# Patient Record
Sex: Female | Born: 1940 | Race: Black or African American | Hispanic: No | Smoking: Never smoker
Health system: Southern US, Community
[De-identification: ages and names within clinical notes are randomized; demographics above are authoritative.]

## PROBLEM LIST (undated history)

## (undated) DIAGNOSIS — J45909 Unspecified asthma, uncomplicated: Secondary | ICD-10-CM

## (undated) DIAGNOSIS — K219 Gastro-esophageal reflux disease without esophagitis: Secondary | ICD-10-CM

## (undated) DIAGNOSIS — I10 Essential (primary) hypertension: Secondary | ICD-10-CM

## (undated) DIAGNOSIS — N189 Chronic kidney disease, unspecified: Secondary | ICD-10-CM

## (undated) DIAGNOSIS — M109 Gout, unspecified: Secondary | ICD-10-CM

## (undated) DIAGNOSIS — M1990 Unspecified osteoarthritis, unspecified site: Secondary | ICD-10-CM

## (undated) DIAGNOSIS — R0602 Shortness of breath: Secondary | ICD-10-CM

## (undated) HISTORY — DX: Gout, unspecified: M10.9

## (undated) HISTORY — PX: TONSILLECTOMY: SUR1361

## (undated) HISTORY — DX: Chronic kidney disease, unspecified: N18.9

## (undated) HISTORY — DX: Unspecified asthma, uncomplicated: J45.909

## (undated) HISTORY — PX: TUBAL LIGATION: SHX77

---

## 2002-01-22 ENCOUNTER — Ambulatory Visit (HOSPITAL_COMMUNITY): Admission: RE | Admit: 2002-01-22 | Discharge: 2002-01-22 | Payer: Self-pay | Admitting: Internal Medicine

## 2003-02-24 ENCOUNTER — Ambulatory Visit (HOSPITAL_COMMUNITY): Admission: RE | Admit: 2003-02-24 | Discharge: 2003-02-24 | Payer: Self-pay | Admitting: Internal Medicine

## 2003-02-28 ENCOUNTER — Encounter: Payer: Self-pay | Admitting: Internal Medicine

## 2003-02-28 ENCOUNTER — Ambulatory Visit (HOSPITAL_COMMUNITY): Admission: RE | Admit: 2003-02-28 | Discharge: 2003-02-28 | Payer: Self-pay | Admitting: Internal Medicine

## 2005-04-07 ENCOUNTER — Ambulatory Visit: Payer: Self-pay | Admitting: Internal Medicine

## 2005-05-04 ENCOUNTER — Ambulatory Visit (HOSPITAL_COMMUNITY): Admission: RE | Admit: 2005-05-04 | Discharge: 2005-05-04 | Payer: Self-pay | Admitting: Internal Medicine

## 2005-05-04 ENCOUNTER — Encounter: Payer: Self-pay | Admitting: Internal Medicine

## 2005-05-04 ENCOUNTER — Ambulatory Visit: Payer: Self-pay | Admitting: Internal Medicine

## 2008-12-03 ENCOUNTER — Ambulatory Visit: Payer: Self-pay | Admitting: Cardiology

## 2009-11-13 ENCOUNTER — Emergency Department (HOSPITAL_COMMUNITY): Admission: EM | Admit: 2009-11-13 | Discharge: 2009-11-13 | Payer: Self-pay | Admitting: Emergency Medicine

## 2010-11-21 LAB — URINE CULTURE: Colony Count: 45000

## 2010-11-21 LAB — URINALYSIS, ROUTINE W REFLEX MICROSCOPIC
Glucose, UA: NEGATIVE mg/dL
Nitrite: POSITIVE — AB
Protein, ur: 30 mg/dL — AB
Specific Gravity, Urine: 1.01 (ref 1.005–1.030)
Urobilinogen, UA: 0.2 mg/dL (ref 0.0–1.0)

## 2010-11-21 LAB — COMPREHENSIVE METABOLIC PANEL
AST: 38 U/L — ABNORMAL HIGH (ref 0–37)
GFR calc Af Amer: 45 mL/min — ABNORMAL LOW (ref >60)
GFR calc non Af Amer: 37 mL/min — ABNORMAL LOW (ref >60)
Sodium: 136 mEq/L (ref 135–145)
Total Protein: 8.1 g/dL (ref 6.0–8.3)

## 2010-11-21 LAB — DIFFERENTIAL
Basophils Absolute: 0 10*3/uL (ref 0.0–0.1)
Basophils Relative: 0 % (ref 0–1)
Eosinophils Relative: 0 % (ref 0–5)
Neutro Abs: 7 10*3/uL (ref 1.7–7.7)
Neutrophils Relative %: 76 % (ref 43–77)

## 2010-11-21 LAB — CBC
MCHC: 33.3 g/dL (ref 30.0–36.0)
MCV: 96.3 fL (ref 78.0–100.0)
Platelets: 308 10*3/uL (ref 150–400)
RBC: 3.45 MIL/uL — ABNORMAL LOW (ref 3.87–5.11)
WBC: 9.2 10*3/uL (ref 4.0–10.5)

## 2010-11-21 LAB — URINE MICROSCOPIC-ADD ON

## 2011-01-14 NOTE — H&P (Signed)
[name], [name]           ACCOUNT NO.:  [id number]   MEDICAL RECORD NO.:  [id number]          PATIENT TYPE:  AMB   LOCATION:                                FACILITY:  [facility]   PHYSICIAN:  R. Roetta Sessions, M.D. DATE OF BIRTH:  1940-12-04   DATE OF ADMISSION:  04/07/2005  DATE OF DISCHARGE:  LH                                HISTORY & PHYSICAL   CHIEF COMPLAINT:  History of possible polyp in the rectosigmoid on prior  ACBE.   HISTORY OF PRESENT ILLNESS:  [name] is a 70 year old lady who  underwent a colonoscopy for screening back in 2004.  The exam was  suboptimal.  It was complemented by air contract barium enema.  She was felt  to have a possible 5 mm polyp in the rectosigmoid.  It could not be  confirmed.  This was not seen with colonoscopy previously.  Has had known  rectal bleeding.  There is no family history of colorectal neoplasia.  Her  reflex symptoms have been well controlled on Prevacid 30 mg daily.  She  underwent esophageal dilatation at the same time.  A 56-French Maloney  dilator was passed, although there was no structural lesion found, and it  was associated with resolution of her dysphagia symptoms.   PAST MEDICAL HISTORY:  1.  H. pylori gastritis, previously treated with Prevpac therapy.  2.  History of gastroesophageal reflux disease.  3.  History of duodenal bulbar ulcer disease previously.  4.  History of seasonal allergies.  5.  Hypertension.   CURRENT MEDICATIONS:  1.  Triamterene 37.5 mg daily.  2.  Prevacid 30 mg daily.  3.  Clarinex p.r.n.  4.  Vitamin E.  5.  Multivitamin.  6.  Calcium.  7.  Zinc daily.  8.  ASA 81 mg daily.   ALLERGIES:  No known drug allergies.   PAST SURGICAL HISTORY:  Tonsillectomy.   FAMILY HISTORY:  No history of chronic GI or liver illness.   SOCIAL HISTORY:  The patient is single.  She has 5 children.  She is  employed at the [employer].  No tobacco, no alcohol.   REVIEW OF SYSTEMS:  No chest pain  or dyspnea on exertion.  No change in  weight.  No fever or chills.   PHYSICAL EXAMINATION:  GENERAL:  A pleasant 70 year old lady resting  comfortably.  VITAL SIGNS:  Weight 219, height 5 feet, 1 inches.  Temperature 98.1, blood  pressure 126/84, pulse 88.  SKIN:  Warm and dry.  HEENT:  No scleral icterus.  Conjunctivae are pink.  CHEST:  Lungs are clear to auscultation.  CARDIAC:  Regular rate and rhythm without murmur, gallop, or rub.  ABDOMEN:  Nondistended.  Positive bowel sounds.  Soft, nontender, without  appreciable mass or organomegaly.  EXTREMITIES:  No edema.   ADMITTING IMPRESSION:  [name] is a pleasant 70 year old lady  who underwent screening colonoscopy a little over 2 years ago.  The prep was  poor, and it was an inadequate exam for this reason.  She underwent an air  contrast barium enema, which  suggests a 5 mg polyp in the rectosigmoid.  This was not seen at colonoscopy, but, again, the prep was poor.  It would  be most prudent to survey her GI tract directly via colonoscopy in the near  future, hopefully in the presence of a good prep, to make sure there are not  any occult lesions.  I have discussed the reasoning behind this with Ms.  Garza.  The potential risks, benefits, and alternatives have been  reviewed and questions answered.  She is agreeable.   PLAN:  Will perform colonoscopy in the very near future.  Further  recommendations to follow.      Jonathon Bellows, M.D.  Electronically Signed     RMR/MEDQ  D:  04/07/2005  T:  04/07/2005  Job:  [id number]   cc:   Dorise Hiss, M.D.  [address]  [place]  [place] [address]  Fax: [phone]

## 2011-01-14 NOTE — Op Note (Signed)
NAME:  [name], [name]                     ACCOUNT NO.:  [id number]   MEDICAL RECORD NO.:  [id number]                   PATIENT TYPE:  AMB   LOCATION:  DAY                                  FACILITY:  [facility]   PHYSICIAN:  R. Roetta Sessions, M.D.              DATE OF BIRTH:  1941-01-05   DATE OF PROCEDURE:  02/24/2003  DATE OF DISCHARGE:                                 OPERATIVE REPORT   PROCEDURE:  Esophagogastroduodenoscopy with Elease Hashimoto dilation followed by  screening colonoscopy.   ENDOSCOPIST:  Gerrit Friends. Rourk, M.D.   INDICATIONS FOR PROCEDURE:  The patient is a 70 year old lady with  intermittent esophageal dysphagia.  Reflux symptoms are well controlled on  Prevacid 30 mg orally daily.  She is referred for colorectal cancer  screening and EGD. This lady underwent an EGD last year and was found to  have  a duodenal ulcer.  H. pylori serologies were positive.  She was  treated.  There was no structural abnormality in her esophagus.  Her  esophagus was not dilated.  EGD is now being done along with screening  colonoscopy for the reasons described above.  This approach has been  discussed with the patient previously and again, today, at the bed side.  The potential risks, benefits, and alternatives have been reviewed;  questions answered.  Please see my handwritten H&P.   PROCEDURE NOTE:  O2 saturation, blood pressure, pulse and respirations were  monitored throughout the entire procedure.  Conscious sedation: Versed 3 mg  IV, Demerol 75 mg IV in divided doses.   INSTRUMENT:  Olympus video chip adult gastroscope and pediatric colonoscope.   FINDINGS:  Examination of the tubular esophagus revealed no mucosal  abnormalities.  There was no extrinsic compression.  The EG junction was  easily traversed.   STOMACH:  The gastric cavity was empty.  It insufflated well with air.  A  thorough examination of the gastric mucosa including a retroflex view of the  proximal stomach and  esophagogastric junction demonstrated no abnormalities.  The pylorus was patent and easily traversed.   DUODENUM:  The bulb and the second portion were well seen and appeared  normal.   THERAPEUTIC/DIAGNOSTIC MANEUVERS:  A 56 French Maloney dilator was passed to  full insertion with ease and good patient tolerated.  A look back revealed  no apparent complication for the passage of the dilator.   The patient tolerated the procedure well and was prepared for colonoscopy.  A digital rectal exam revealed no abnormalities.   ENDOSCOPIC FINDINGS:  The prep was good until the right colon was reached.  This segment of the colon was prepped poorly and incompletely seen.   RECTUM:  Examination of the rectal mucosa including the retroflex view of  the anal verge revealed no abnormalities.   COLON:  The colonic mucosa was surveyed from the rectosigmoid junction  through the left transverse and right colon to the area of  the appendiceal  orifice, ileocecal valve, and cecum.  The cecum was reached; however, from  approximately the hepatic flexure to the cecum the prep was very poor with  formed, semiformed stool, and tenacious coating of viscus stool on the  mucosa precluding complete examination.  There were no gross abnormalities  seen to the cecum.   From the level of the cecum and ileocecal valve the scope was withdrawn.  All previously mentioned mucosal surfaces were again seen; and, again, no  gross mucosal abnormalities were observed.  The patient tolerated both  procedures well.   EGD IMPRESSION:  1. Normal esophagus.  2. Normal stomach.  3. Normal D1 and D2 status post passage of a 56 [place] Maloney dilator as     described above.   COLONOSCOPY FINDINGS:  1. Normal rectum.  2. Poor prep precluded complete examination of the right colon.  No gross     abnormalities.   RECOMMENDATIONS:  1. Continue Prevacid 30 mg orally daily for gastroesophageal reflux disease.  2. Follow up  with [place] in 1 month to reassess for dysphagia. She will need     further evaluation if she has persistent symptoms.  3. Will complement today's colonoscopy with an air contrast barium enema to     image her right colon.  4. Further recommendations to follow.                                               Jonathon Bellows, M.D.    RMR/MEDQ  D:  02/24/2003  T:  02/24/2003  Job:  [id number]   cc:   Dorise Hiss, M.D.  [address]  [place]  [place] [address]  Fax: [phone]

## 2011-01-14 NOTE — Op Note (Signed)
[facility]  Patient:    [name], [name] Visit Number: [id number] MRN: [id number]          Service Type: DSU Location: DAY Attending Physician:  Jonathon Bellows Dictated by:   Roetta Sessions, M.D. Proc. Date: 01/22/02 Admit Date:  01/22/2002   CC:         Dorise Hiss, M.D., [facility]   Operative Report  PROCEDURE:  Diagnostic esophagogastroduodenoscopy.  GASTROENTEROLOGIST:  Roetta Sessions, M.D.  INDICATIONS:  The patient is a 70 year old lady referred through the courtesy of Dr. Lucianne Lei to further evaluate a several-year history of gastroesophageal reflux symptoms, vague intermittent esophageal dysphagia, and vague upper abdominal discomfort.  She does take aspirin daily.  She was started on Prevacid 30 mg orally daily just last week, and she says all of the above symptoms have improved.  EGD is now being done to further evaluate her symptoms.  This approach has been discussed with the patient at length. Potential risks, benefits, and alternatives have been reviewed and questions have been answered.  She is agreeable. Please see my handwritten H&P.  I feel she is at low risk for conscious sedation.  PROCEDURE NOTE:  The patient was placed in the left lateral decubitus position.  O2 saturation, blood pressure, and pulse for this patient  were monitored throughout the entire procedure.  CONSCIOUS SEDATION:  Versed 3 mg IV, Demerol 75 mg IV in divided doses. Cetacaine spray for topical oropharyngeal anesthesia.  INSTRUMENT:  Olympus video chip gastroscope.  FINDINGS:   Examination of the tubular esophagus revealed no mucosal abnormalities aside from a couple of tiny distal esophageal erosions.  The EG junction was widely patent and easily traversed.  STOMACH:  Gastric cavity was empty and insufflated well with air.  Thorough examination of gastric mucosa including retroflexed view of the proximal stomach and esophagogastric junction  demonstrated no abnormalities.  Pylorus was patent and easily traversed.  DUODENUM:  In the bulb, there were two posterior bulbar ulcers, one approximately 9 mm in diameter, the other approximately 4 mm in diameter. These had clean bases.  Please see photos.  The remainder of the bulb appeared normal.  The second portion appeared normal.  THERAPEUTIC/DIAGNOSTIC MANEUVERS PERFORMED:  None.  The patient tolerated the procedure well and was reacted in endoscopy.  IMPRESSION: 1. Tiny distal esophageal erosions consistent with mild erosive reflux    esophagitis. 1. Normal stomach. 3. Duodenal bulbar ulcers as described above.  The second portion appeared    normal.  RECOMMENDATIONS: 1. Stop aspirin and all nonsteroidals. 2. Continue Prevacid 30 mg orally daily. 3. Gastroesophageal reflux literature given to the patient. 4. It would be of benefit for her to lose some weight. 5. Will draw H. pylori serology and treat if positive.  I would like to thank Dr. Lucianne Lei for allowing me to see this nice lady today. Dictated by:   Roetta Sessions, M.D. Attending Physician:  Jonathon Bellows DD:  01/22/02 TD:  01/23/02 Job: [id number]

## 2011-01-14 NOTE — Op Note (Signed)
[name], [name]           ACCOUNT NO.:  [id number]   MEDICAL RECORD NO.:  [id number]          PATIENT TYPE:  AMB   LOCATION:  DAY                           FACILITY:  [facility]   PHYSICIAN:  R. Roetta Sessions, M.D. DATE OF BIRTH:  [birth year]   DATE OF PROCEDURE:  05/04/2005  DATE OF DISCHARGE:                                 OPERATIVE REPORT   PROCEDURE:  Colonoscopy with snare polypectomy.   INDICATIONS FOR PROCEDURE:  The patient is a 70 year old lady status post  screening colonoscopy back in 2004. Prep was poor to the point it was  complimented with intracontrast barium enema, suggested a 5-mm polyp at the  rectosigmoid. She has done well. She has not had any lower GI tract  symptoms. She is here for followup colonoscopy given the poor prep  previously and questionable polyp seen previously. This approach has been  discussed with the patient. Potential risks, benefits, and alternatives have  been reviewed and questions answered. Please see the documentation in the  medical record.   PROCEDURE NOTE:  O2 saturation, blood pressure, pulse, and respirations were  monitored throughout the entire procedure. Conscious sedation with Versed 3  mg IV and Demerol 75 mg IV in divided doses.   INSTRUMENT:  Olympus video chip system.   FINDINGS:  Digital rectal exam revealed no abnormalities.   ENDOSCOPIC FINDINGS:  Prep was good.   Rectum:  Examination of the rectal mucosa including retroflexed view of the  anal verge revealed no abnormalities.   Colon:  Colonic mucosa was surveyed from the rectosigmoid junction through  the left, transverse, and right colon to the area of the appendiceal  orifice, ileocecal valve, and cecum. These structures were well seen and  photographed for the record. From this level, the scope was slowly  withdrawn, and all previously mentioned mucosal surfaces were again seen.  The following abnormalities were noted:  1.  Scattered left sided  diverticula.  2.  A 6-mm pedunculated polyp at the rectosigmoid junction 18 cm.   The remainder of the rectal and colonic mucosa appeared normal. The polyp at  18 cm was removed with snare cautery and recovered through the scope. The  patient tolerated the procedure well and was reactive to endoscopy.   IMPRESSION:  1.  Normal rectum.  2.  Rectosigmoid pedunculated polyp 18 cm, removed with snare as described      above. Scattered left sided diverticula. The remainder of the colonic      mucosa appeared normal.   RECOMMENDATIONS:  1.  No aspirin or arthritis medications for 10 days.  2.  Diverticulosis literature provided to Ms. [name].  3.  Follow up on pathology.  4.  Further recommendations to follow.      Jonathon Bellows, M.D.  Electronically Signed     RMR/MEDQ  D:  05/04/2005  T:  05/04/2005  Job:  [id number]   cc:   Dorise Hiss, M.D.  [address]  [place]  [place] [address]  Fax: [phone]

## 2011-08-30 ENCOUNTER — Encounter: Payer: Self-pay | Admitting: *Deleted

## 2011-08-30 ENCOUNTER — Emergency Department (HOSPITAL_COMMUNITY)
Admission: EM | Admit: 2011-08-30 | Discharge: 2011-08-30 | Disposition: A | Discharge status: Home / Self Care | Payer: Medicare Other | Attending: Emergency Medicine | Admitting: Emergency Medicine

## 2011-08-30 ENCOUNTER — Emergency Department (HOSPITAL_COMMUNITY): Payer: Medicare Other

## 2011-08-30 DIAGNOSIS — Z7982 Long term (current) use of aspirin: Secondary | ICD-10-CM | POA: Insufficient documentation

## 2011-08-30 DIAGNOSIS — I10 Essential (primary) hypertension: Secondary | ICD-10-CM | POA: Diagnosis not present

## 2011-08-30 DIAGNOSIS — M65839 Other synovitis and tenosynovitis, unspecified forearm: Secondary | ICD-10-CM | POA: Insufficient documentation

## 2011-08-30 DIAGNOSIS — M25539 Pain in unspecified wrist: Secondary | ICD-10-CM | POA: Insufficient documentation

## 2011-08-30 DIAGNOSIS — K219 Gastro-esophageal reflux disease without esophagitis: Secondary | ICD-10-CM | POA: Diagnosis not present

## 2011-08-30 DIAGNOSIS — M659 Synovitis and tenosynovitis, unspecified: Secondary | ICD-10-CM

## 2011-08-30 DIAGNOSIS — M129 Arthropathy, unspecified: Secondary | ICD-10-CM | POA: Diagnosis not present

## 2011-08-30 DIAGNOSIS — M79609 Pain in unspecified limb: Secondary | ICD-10-CM | POA: Diagnosis not present

## 2011-08-30 HISTORY — DX: Essential (primary) hypertension: I10

## 2011-08-30 HISTORY — DX: Gastro-esophageal reflux disease without esophagitis: K21.9

## 2011-08-30 HISTORY — DX: Unspecified osteoarthritis, unspecified site: M19.90

## 2011-08-30 MED ORDER — HYDROCODONE-ACETAMINOPHEN 5-325 MG PO TABS
1.0000 | ORAL_TABLET | Freq: Once | ORAL | Status: AC
  Administered 2011-08-30: 1 via ORAL
  Filled 2011-08-30: qty 1

## 2011-08-30 MED ORDER — HYDROCODONE-ACETAMINOPHEN 5-325 MG PO TABS
ORAL_TABLET | ORAL | Status: AC
Start: 2011-08-30 — End: 2011-09-09

## 2011-08-30 MED ORDER — PREDNISONE 20 MG PO TABS: ORAL_TABLET | ORAL | Status: DC

## 2011-08-30 NOTE — ED Notes (Signed)
C/o increased pain/swelling to right hand with decreased ROM x 3 days.  Has hx of arthritis.  Denies injury.

## 2011-08-30 NOTE — ED Provider Notes (Signed)
History     CSN: [id number]  Arrival date & time 08/30/11  1152   First MD Initiated Contact with Patient 08/30/11 1415      Chief Complaint  Patient presents with  . Hand Pain    (Consider location/radiation/quality/duration/timing/severity/associated sxs/prior treatment) HPI Comments: patient c/o persistent  Pain and swelling to the right wrist and hand for several days.  She denies injuries, numbness or weakness of the hand.  Pain is worse with use or movement and improves with rest.  Has been told she has arthritis in her hands and wrist.    Patient is a 71 y.o. female presenting with wrist pain. The history is provided by the patient.  Wrist Pain This is a new problem. The current episode started in the past 7 days. The problem occurs constantly. The problem has been unchanged. Associated symptoms include arthralgias and joint swelling. Pertinent negatives include no chest pain, fever, neck pain, numbness, rash, swollen glands, vomiting or weakness. Exacerbated by: movement, use and plaption. She has tried acetaminophen for the symptoms. The treatment provided no relief.    Past Medical History  Diagnosis Date  . Arthritis   . Acid reflux   . Hypertension     History reviewed. No pertinent past surgical history.  History reviewed. No pertinent family history.  History  Substance Use Topics  . Smoking status: Never Smoker   . Smokeless tobacco: Not on file  . Alcohol Use: No    OB History    Grav Para Term Preterm Abortions TAB SAB Ect Mult Living                  Review of Systems  Constitutional: Negative for fever.  HENT: Negative for neck pain.   Cardiovascular: Negative for chest pain.  Gastrointestinal: Negative for vomiting.  Musculoskeletal: Positive for joint swelling and arthralgias. Negative for back pain.  Skin: Negative for color change and rash.  Neurological: Negative for dizziness, weakness and numbness.  Hematological: Negative for adenopathy.   All other systems reviewed and are negative.    Allergies  Review of patient's allergies indicates no known allergies.  Home Medications   Current Outpatient Rx  Name Route Sig Dispense Refill  . ACETAMINOPHEN ER 650 MG PO TBCR Oral Take 650 mg by mouth every 8 (eight) hours as needed. pain     . ASPIRIN EC 81 MG PO TBEC Oral Take 81 mg by mouth daily.      Marland Kitchen VITAMIN D3 2000 UNITS PO TABS Oral Take 1 tablet by mouth daily.      . MELOXICAM 7.5 MG PO TABS Oral Take 7.5 mg by mouth 2 (two) times daily.      Marland Kitchen OMEPRAZOLE 20 MG PO CPDR Oral Take 20 mg by mouth daily.      . TRIAMTERENE-HCTZ 37.5-25 MG PO TABS Oral Take 1 tablet by mouth daily.        BP 142/82  Pulse 95  Temp(Src) 98.9 F (37.2 C) (Oral)  Resp 20  SpO2 99%  Physical Exam  Nursing note and vitals reviewed. Constitutional: She is oriented to person, place, and time. She appears well-developed and well-nourished. No distress.  HENT:  Head: Normocephalic and atraumatic.  Cardiovascular: Normal rate, regular rhythm and normal heart sounds.   Pulmonary/Chest: Effort normal and breath sounds normal.  Musculoskeletal: She exhibits edema and tenderness.       Right wrist: She exhibits decreased range of motion, tenderness, bony tenderness and swelling. She exhibits no  effusion, no crepitus, no deformity and no laceration.       Right hand: She exhibits swelling. She exhibits normal two-point discrimination, normal capillary refill, no deformity and no laceration. normal sensation noted. Normal strength noted.       Moderate ttp of the right wrist.  Pain is reproduced with flexion and has a positive finkelstein test.    Neurological: She is alert and oriented to person, place, and time. She exhibits normal muscle tone. Coordination normal.  Skin: Skin is warm and dry.    ED Course  SPLINT APPLICATION Date/Time: 08/30/2011 3:31 PM Performed by: Trisha Mangle, Joshawa Dubin L. Authorized by: Maxwell Caul Consent: Verbal  consent obtained. Written consent not obtained. Consent given by: patient Patient understanding: patient states understanding of the procedure being performed Patient consent: the patient's understanding of the procedure matches consent given Imaging studies: imaging studies available Patient identity confirmed: verbally with patient Location details: right wrist Splint type: velcro wrist brace. Post-procedure: The splinted body part was neurovascularly unchanged following the procedure. Patient tolerance: Patient tolerated the procedure well with no immediate complications.   (including critical care time)  Labs Reviewed - No data to display Dg Hand Complete Right  08/30/2011  *RADIOLOGY REPORT*  Clinical Data: Pain post fall  RIGHT HAND - COMPLETE 3+ VIEW  Comparison: None.  Findings: Three views of the right hand submitted.  No acute fracture or subluxation.  Mild degenerative changes interphalangeal joint of the thumb. Mild narrowing of radiocarpal joint.  IMPRESSION: No acute fracture or subluxation.  Mild degenerative changes.  Original Report Authenticated By: Natasha Mead, M.D.        MDM    ttp of the right wrist.  Mild STS of the dorsal hand.  Pain is reproduced with flexion of the wrist and positive Finkelstein test.  Pain likely related to tenosynovitis of the wrist.  I will apply brace and pt agrees to close f/u with ortho if sx's are not improving  Patient / Family / Caregiver understand and agree with initial ED impression and plan with expectations set for ED visit.       Tamara Monteith L. [place], [place] 09/01/11 1546

## 2011-09-02 NOTE — ED Provider Notes (Signed)
Medical screening examination/treatment/procedure(s) were performed by non-physician practitioner and as supervising physician I was immediately available for consultation/collaboration.   Latonga Ponder, MD 09/02/11 1532 

## 2011-09-05 DIAGNOSIS — M779 Enthesopathy, unspecified: Secondary | ICD-10-CM | POA: Diagnosis not present

## 2011-09-05 DIAGNOSIS — M25549 Pain in joints of unspecified hand: Secondary | ICD-10-CM | POA: Diagnosis not present

## 2011-09-08 ENCOUNTER — Ambulatory Visit: Payer: Medicare Other | Admitting: Orthopedic Surgery

## 2011-09-20 DIAGNOSIS — M25549 Pain in joints of unspecified hand: Secondary | ICD-10-CM | POA: Diagnosis not present

## 2011-10-19 DIAGNOSIS — M25549 Pain in joints of unspecified hand: Secondary | ICD-10-CM | POA: Diagnosis not present

## 2011-11-15 ENCOUNTER — Ambulatory Visit (INDEPENDENT_AMBULATORY_CARE_PROVIDER_SITE_OTHER): Payer: Medicare Other | Admitting: Family Medicine

## 2011-11-15 ENCOUNTER — Encounter: Payer: Self-pay | Admitting: Family Medicine

## 2011-11-15 VITALS — BP 140/90 | HR 80 | Resp 16 | Ht 63.0 in | Wt 213.0 lb

## 2011-11-15 DIAGNOSIS — I10 Essential (primary) hypertension: Secondary | ICD-10-CM

## 2011-11-15 DIAGNOSIS — E669 Obesity, unspecified: Secondary | ICD-10-CM | POA: Diagnosis not present

## 2011-11-15 DIAGNOSIS — R209 Unspecified disturbances of skin sensation: Secondary | ICD-10-CM | POA: Diagnosis not present

## 2011-11-15 DIAGNOSIS — R51 Headache: Secondary | ICD-10-CM | POA: Diagnosis not present

## 2011-11-15 DIAGNOSIS — E663 Overweight: Secondary | ICD-10-CM

## 2011-11-15 DIAGNOSIS — K219 Gastro-esophageal reflux disease without esophagitis: Secondary | ICD-10-CM

## 2011-11-15 DIAGNOSIS — N644 Mastodynia: Secondary | ICD-10-CM

## 2011-11-15 DIAGNOSIS — E559 Vitamin D deficiency, unspecified: Secondary | ICD-10-CM | POA: Diagnosis not present

## 2011-11-15 DIAGNOSIS — Z1239 Encounter for other screening for malignant neoplasm of breast: Secondary | ICD-10-CM

## 2011-11-15 DIAGNOSIS — R202 Paresthesia of skin: Secondary | ICD-10-CM

## 2011-11-15 MED ORDER — LANSOPRAZOLE 30 MG PO CPDR: 30.0000 mg | DELAYED_RELEASE_CAPSULE | Freq: Every day | ORAL | Status: DC

## 2011-11-15 NOTE — Patient Instructions (Addendum)
For the breast pain I will set you up for a Mammogram At [facility] I will set you up for a CT scan of the head and x-ray of the neck Continue the blood pressure pill I will get the records from Dr. Sherryll Burger and the [facility] For the acid reflux I will send a new prescription, take in the evening  Get the labs done fasting- do not eat after midnight  F/U 4 weeks

## 2011-11-16 DIAGNOSIS — K219 Gastro-esophageal reflux disease without esophagitis: Secondary | ICD-10-CM | POA: Insufficient documentation

## 2011-11-16 DIAGNOSIS — N644 Mastodynia: Secondary | ICD-10-CM | POA: Insufficient documentation

## 2011-11-16 NOTE — Assessment & Plan Note (Signed)
Her symptoms are concerning and she finds them debilitating when they occur, differential atypical migraine, less likley seizure, does not fit trigeminal distribution. Will obtain MRI of head, will also get Neck film for possible radicular pain from cervical stenosis

## 2011-11-16 NOTE — Assessment & Plan Note (Signed)
Increase PPI, pt to take at bedtime to see if this helps symptoms

## 2011-11-16 NOTE — Progress Notes (Signed)
  Subjective:    Patient ID: [name], female    DOB: 1941-04-25, 71 y.o.   MRN: [id number]  HPI Pt here to establish care, Previous PCP - Dr. 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 Headache- she has been getting debilitating pains in her head that come at random times. The pains start at the back of her head and shot forward. She denies seizure activity, LOC, change in vision. Her family has not seen any changes in mentation. Her skin also feels different during the episodes  2. GERD- takes PPI daily, continues to have symptoms, in the morning she has phlegm in her throat and sour taste in the mouth, when she started taking it twice a day her symptoms were better  3. Arthritis uses voltaren gel  4. Seasonal allergies- has a lot of sinus drainage, uses OTC meds 5. Breast pains- she gets sharp pains in her right breast at times, no lumps felt  Review of Systems   GEN- denies fatigue, fever, weight loss,weakness, recent illness HEENT- denies eye drainage, change in vision, +nasal discharge, CVS- denies chest pain, palpitations RESP- denies SOB, cough, wheeze ABD- denies N/V, change in stools, abd pain GU- denies dysuria, hematuria, dribbling, incontinence MSK- + joint pain, muscle aches, injury Neuro-+ headache, dizziness, syncope, seizure activity      Objective:   Physical Exam GEN- NAD, alert and oriented x3 HEENT- PERRL, EOMI, non injected sclera, pink conjunctiva, MMM, oropharynx clear, fundus appears normal Neck- Supple, no bruit CVS- RRR, no murmur RESP-CTAB ABD-soft, NABS,NT,ND EXT- No edema Pulses- Radial, DP- 2+ Neuro- CNII-XII in tact, no focal deficits,motor equal bilat, normal speech       Assessment & Plan:

## 2011-11-16 NOTE — Assessment & Plan Note (Signed)
This coincides with HA, will obtain C spine x-ray

## 2011-11-16 NOTE — Assessment & Plan Note (Signed)
Mammogram to be done

## 2011-11-16 NOTE — Assessment & Plan Note (Signed)
Continue current meds 

## 2011-11-19 DIAGNOSIS — E669 Obesity, unspecified: Secondary | ICD-10-CM | POA: Diagnosis not present

## 2011-11-19 DIAGNOSIS — I10 Essential (primary) hypertension: Secondary | ICD-10-CM | POA: Diagnosis not present

## 2011-11-19 LAB — LIPID PANEL
HDL: 71 mg/dL (ref >39)
LDL Cholesterol: 119 mg/dL — ABNORMAL HIGH (ref 0–99)
Total CHOL/HDL Ratio: 2.9 Ratio
Triglycerides: 71 mg/dL (ref <150)

## 2011-11-19 LAB — CBC
HCT: 43.5 % (ref 36.0–46.0)
Hemoglobin: 13.9 g/dL (ref 12.0–15.0)
MCV: 100.2 fL — ABNORMAL HIGH (ref 78.0–100.0)
RBC: 4.34 MIL/uL (ref 3.87–5.11)
WBC: 4.8 10*3/uL (ref 4.0–10.5)

## 2011-11-19 LAB — COMPREHENSIVE METABOLIC PANEL
BUN: 22 mg/dL (ref 6–23)
CO2: 30 mEq/L (ref 19–32)
Calcium: 10 mg/dL (ref 8.4–10.5)
Creat: 1.13 mg/dL — ABNORMAL HIGH (ref 0.50–1.10)
Glucose, Bld: 79 mg/dL (ref 70–99)
Total Bilirubin: 0.6 mg/dL (ref 0.3–1.2)

## 2011-11-19 LAB — TSH: TSH: 0.945 u[IU]/mL (ref 0.350–4.500)

## 2011-11-22 ENCOUNTER — Ambulatory Visit (HOSPITAL_COMMUNITY)
Admission: RE | Admit: 2011-11-22 | Discharge: 2011-11-22 | Disposition: A | Discharge status: Home / Self Care | Payer: Medicare Other | Source: Ambulatory Visit | Attending: Family Medicine | Admitting: Family Medicine

## 2011-11-22 DIAGNOSIS — I10 Essential (primary) hypertension: Secondary | ICD-10-CM | POA: Insufficient documentation

## 2011-11-22 DIAGNOSIS — R51 Headache: Secondary | ICD-10-CM | POA: Diagnosis not present

## 2011-12-13 ENCOUNTER — Ambulatory Visit (INDEPENDENT_AMBULATORY_CARE_PROVIDER_SITE_OTHER): Payer: Medicare Other | Admitting: Family Medicine

## 2011-12-13 ENCOUNTER — Encounter: Payer: Self-pay | Admitting: Family Medicine

## 2011-12-13 VITALS — BP 122/82 | HR 93 | Resp 16 | Ht 63.0 in | Wt 212.1 lb

## 2011-12-13 DIAGNOSIS — E785 Hyperlipidemia, unspecified: Secondary | ICD-10-CM | POA: Diagnosis not present

## 2011-12-13 DIAGNOSIS — I10 Essential (primary) hypertension: Secondary | ICD-10-CM

## 2011-12-13 DIAGNOSIS — N289 Disorder of kidney and ureter, unspecified: Secondary | ICD-10-CM

## 2011-12-13 DIAGNOSIS — R51 Headache: Secondary | ICD-10-CM

## 2011-12-13 DIAGNOSIS — K219 Gastro-esophageal reflux disease without esophagitis: Secondary | ICD-10-CM | POA: Diagnosis not present

## 2011-12-13 NOTE — Assessment & Plan Note (Signed)
At goal today, no change to meds

## 2011-12-13 NOTE — Assessment & Plan Note (Signed)
Mildly elevated, goal LDL < 120

## 2011-12-13 NOTE — Progress Notes (Signed)
  Subjective:    Patient ID: [name], female    DOB: Jan 24, 1941, 71 y.o.   MRN: [id number]  HPI Patient here to followup labs and high blood pressure. MRI was obtained secondary to headaches which was normal. Labs were reviewed which showed mild renal insufficiency. She is doing well today has no concerns. Her headaches have resolved. Reflux better with PPI OTC   Review of Systems   GEN- denies fatigue, fever, weight loss,weakness, recent illness HEENT- denies eye drainage, change in vision, nasal discharge, CVS- denies chest pain, palpitations RESP- denies SOB, cough, wheeze ABD- denies N/V, change in stools, abd pain GU- denies dysuria, hematuria, dribbling, incontinence MSK- denies joint pain, muscle aches, injury Neuro- denies headache, dizziness, syncope, seizure activity      Objective:   Physical Exam GEN- NAD, alert and oriented x3 CVS- RRR, no murmur RESP-CTAB EXT- No edema Pulses- Radial, DP- 2+       Assessment & Plan:

## 2011-12-13 NOTE — Assessment & Plan Note (Signed)
Improved, normal MRI with mild vascular changes

## 2011-12-13 NOTE — Assessment & Plan Note (Signed)
Improved, taking OTC prevacid

## 2011-12-13 NOTE — Assessment & Plan Note (Signed)
Will monitor, cause HTN and age

## 2011-12-13 NOTE — Patient Instructions (Signed)
Your MRI was normal Cholesterol looks good You have some mild  damage to kidneys from the blood pressure, I will keep an eye on this  Blood pressure looks good today  Schedule Mammogram  F/U 3 months

## 2011-12-15 DIAGNOSIS — H5200 Hypermetropia, unspecified eye: Secondary | ICD-10-CM | POA: Diagnosis not present

## 2011-12-15 DIAGNOSIS — H524 Presbyopia: Secondary | ICD-10-CM | POA: Diagnosis not present

## 2011-12-15 DIAGNOSIS — H25019 Cortical age-related cataract, unspecified eye: Secondary | ICD-10-CM | POA: Diagnosis not present

## 2011-12-15 DIAGNOSIS — H40009 Preglaucoma, unspecified, unspecified eye: Secondary | ICD-10-CM | POA: Diagnosis not present

## 2011-12-26 ENCOUNTER — Ambulatory Visit (HOSPITAL_COMMUNITY)
Admission: RE | Admit: 2011-12-26 | Discharge: 2011-12-26 | Disposition: A | Discharge status: Home / Self Care | Payer: Medicare Other | Source: Ambulatory Visit | Attending: Family Medicine | Admitting: Family Medicine

## 2011-12-26 DIAGNOSIS — N644 Mastodynia: Secondary | ICD-10-CM

## 2011-12-26 DIAGNOSIS — Z1231 Encounter for screening mammogram for malignant neoplasm of breast: Secondary | ICD-10-CM | POA: Insufficient documentation

## 2011-12-26 DIAGNOSIS — Z1239 Encounter for other screening for malignant neoplasm of breast: Secondary | ICD-10-CM

## 2012-03-13 ENCOUNTER — Ambulatory Visit (INDEPENDENT_AMBULATORY_CARE_PROVIDER_SITE_OTHER): Payer: Medicare Other | Admitting: Family Medicine

## 2012-03-13 ENCOUNTER — Encounter: Payer: Self-pay | Admitting: Family Medicine

## 2012-03-13 VITALS — BP 128/80 | HR 87 | Resp 18 | Ht 63.0 in | Wt 218.0 lb

## 2012-03-13 DIAGNOSIS — M179 Osteoarthritis of knee, unspecified: Secondary | ICD-10-CM

## 2012-03-13 DIAGNOSIS — E785 Hyperlipidemia, unspecified: Secondary | ICD-10-CM

## 2012-03-13 DIAGNOSIS — E663 Overweight: Secondary | ICD-10-CM | POA: Diagnosis not present

## 2012-03-13 DIAGNOSIS — K219 Gastro-esophageal reflux disease without esophagitis: Secondary | ICD-10-CM | POA: Diagnosis not present

## 2012-03-13 DIAGNOSIS — M1710 Unilateral primary osteoarthritis, unspecified knee: Secondary | ICD-10-CM | POA: Insufficient documentation

## 2012-03-13 DIAGNOSIS — I10 Essential (primary) hypertension: Secondary | ICD-10-CM | POA: Diagnosis not present

## 2012-03-13 DIAGNOSIS — IMO0002 Reserved for concepts with insufficient information to code with codable children: Secondary | ICD-10-CM

## 2012-03-13 MED ORDER — LANSOPRAZOLE 15 MG PO CPDR: 30.0000 mg | DELAYED_RELEASE_CAPSULE | Freq: Two times a day (BID) | ORAL | Status: DC

## 2012-03-13 MED ORDER — TRIAMTERENE-HCTZ 37.5-25 MG PO TABS: 1.0000 | ORAL_TABLET | Freq: Every day | ORAL | Status: DC

## 2012-03-13 NOTE — Assessment & Plan Note (Signed)
Discussed some weight gain. She tends to snack and eat later in the evening. Advised her to walk on her days off. Discuss adding for a fresh fruit and vegetable

## 2012-03-13 NOTE — Assessment & Plan Note (Signed)
Well-controlled medication Medicaid

## 2012-03-13 NOTE — Progress Notes (Signed)
  Subjective:    Patient ID: [name], female    DOB: 1941/05/14, 71 y.o.   MRN: [id number]  HPI Patient presents to followup chronic routine medical problems. She has no concerns today. Her headaches are much improved and she rarely has one but is relieved with Tylenol. She's been taking her blood pressure medication as prescribed and has no difficulty with this medication. She rarely gets some ankle swelling. She does have pain in her right knee which is chronic she is arthritis of the knee however does not want any interventions at this time. She's never had steroid injections Acid reflux is doing well with PPI Prevention is up-to-date Patient declines pneumonia vaccine   Review of Systems  GEN- denies fatigue, fever, weight loss,weakness, recent illness HEENT- denies eye drainage, change in vision, nasal discharge, CVS- denies chest pain, palpitations RESP- denies SOB, cough, wheeze ABD- denies N/V, change in stools, abd pain GU- denies dysuria, hematuria, dribbling, incontinence MSK- + joint pain, muscle aches, injury Neuro- few headache, denies dizziness, syncope, seizure activity      Objective:   Physical Exam GEN- NAD, alert and oriented x3 HEENT- PERRL, EOMI, non injected sclera, pink conjunctiva, MMM, oropharynx clear Neck- Supple,  CVS- RRR, no murmur RESP-CTAB Right Knee- no effusion, mild crepotis, normal inspection, decreased flexion EXT- No edema Pulses- Radial, DP- 2+        Assessment & Plan:

## 2012-03-13 NOTE — Assessment & Plan Note (Signed)
No medications needed she does not have any coronary artery disease that is known

## 2012-03-13 NOTE — Assessment & Plan Note (Signed)
She declines any intervention at this time. She will let me know if orthopedics as needed. Tylenol or Aleve as needed

## 2012-03-13 NOTE — Patient Instructions (Signed)
F/u in 4 months  Continue current medications Call if you want to see the orthopedic surgeon for your knee

## 2012-03-13 NOTE — Assessment & Plan Note (Signed)
Well controlled on over-the-counter Prevacid twice a day

## 2012-04-19 DIAGNOSIS — H40009 Preglaucoma, unspecified, unspecified eye: Secondary | ICD-10-CM | POA: Diagnosis not present

## 2012-05-06 ENCOUNTER — Encounter (HOSPITAL_COMMUNITY): Payer: Self-pay | Admitting: *Deleted

## 2012-05-06 ENCOUNTER — Emergency Department (HOSPITAL_COMMUNITY)
Admission: EM | Admit: 2012-05-06 | Discharge: 2012-05-06 | Disposition: A | Discharge status: Home / Self Care | Payer: Medicare Other | Attending: Emergency Medicine | Admitting: Emergency Medicine

## 2012-05-06 ENCOUNTER — Emergency Department (HOSPITAL_COMMUNITY): Payer: Medicare Other

## 2012-05-06 DIAGNOSIS — M25473 Effusion, unspecified ankle: Secondary | ICD-10-CM | POA: Diagnosis not present

## 2012-05-06 DIAGNOSIS — M25579 Pain in unspecified ankle and joints of unspecified foot: Secondary | ICD-10-CM | POA: Diagnosis not present

## 2012-05-06 DIAGNOSIS — M25571 Pain in right ankle and joints of right foot: Secondary | ICD-10-CM

## 2012-05-06 DIAGNOSIS — I10 Essential (primary) hypertension: Secondary | ICD-10-CM | POA: Diagnosis not present

## 2012-05-06 DIAGNOSIS — K219 Gastro-esophageal reflux disease without esophagitis: Secondary | ICD-10-CM | POA: Diagnosis not present

## 2012-05-06 DIAGNOSIS — Z8739 Personal history of other diseases of the musculoskeletal system and connective tissue: Secondary | ICD-10-CM | POA: Diagnosis not present

## 2012-05-06 DIAGNOSIS — Z79899 Other long term (current) drug therapy: Secondary | ICD-10-CM | POA: Insufficient documentation

## 2012-05-06 DIAGNOSIS — Z7982 Long term (current) use of aspirin: Secondary | ICD-10-CM | POA: Insufficient documentation

## 2012-05-06 DIAGNOSIS — M25476 Effusion, unspecified foot: Secondary | ICD-10-CM | POA: Diagnosis not present

## 2012-05-06 MED ORDER — IBUPROFEN 600 MG PO TABS: 600.0000 mg | ORAL_TABLET | Freq: Three times a day (TID) | ORAL | Status: AC | PRN

## 2012-05-06 MED ORDER — OXYCODONE-ACETAMINOPHEN 5-325 MG PO TABS: 1.0000 | ORAL_TABLET | ORAL | Status: AC | PRN

## 2012-05-06 MED ORDER — IBUPROFEN 400 MG PO TABS
600.0000 mg | ORAL_TABLET | Freq: Once | ORAL | Status: AC
  Administered 2012-05-06: 600 mg via ORAL
  Filled 2012-05-06: qty 2

## 2012-05-06 MED ORDER — OXYCODONE-ACETAMINOPHEN 5-325 MG PO TABS
1.0000 | ORAL_TABLET | Freq: Once | ORAL | Status: DC
  Filled 2012-05-06: qty 1

## 2012-05-06 NOTE — ED Notes (Signed)
Pt did not want to take the percocet, says didn't want to be sleepy.

## 2012-05-06 NOTE — ED Notes (Signed)
C/o right ankle pain and swelling that started on Friday, denies any injury,

## 2012-05-06 NOTE — ED Provider Notes (Signed)
History   This chart was scribed for Lyanne Co, MD by Albertha Ghee Rifaie. This patient was seen in room APA18/APA18 and the patient's care was started at 10:03 AM.   CSN: [id number]  Arrival date & time 05/06/12  0946   First MD Initiated Contact with Patient 05/06/12 1003      Chief Complaint  Patient presents with  . Ankle Pain    HPI  The history is provided by the patient. No language interpreter was used.   [name] is a 71 y.o. female who presents to the Emergency Department complaining of gradually worsening constant right ankle pain that started two days ago after she got of work. The pain is aggravated with the ambulation.The pain is associated with swelling, fever, and chills. She reports one prior injury several years ago and didn't require surgery. Pt denies having a history of diabetes. She does have a history of Arthritis, GERD, and Hypertension. Pt denies smoking and alcohol use.     Past Medical History  Diagnosis Date  . Arthritis   . Acid reflux   . Hypertension     Past Surgical History  Procedure Date  . Tonsillectomy   . Tubal ligation     Family History  Problem Relation Age of Onset  . Cancer Mother   . Diabetes Father     History  Substance Use Topics  . Smoking status: Never Smoker   . Smokeless tobacco: Not on file  . Alcohol Use: No    No OB history provided.   Review of Systems  A complete 10 system review of systems was obtained and all systems are negative except as noted in the HPI and PMH.    Allergies  Review of patient's allergies indicates no known allergies.  Home Medications   Current Outpatient Rx  Name Route Sig Dispense Refill  . ACETAMINOPHEN ER 650 MG PO TBCR Oral Take 650 mg by mouth every 8 (eight) hours as needed. pain     . ASPIRIN EC 81 MG PO TBEC Oral Take 81 mg by mouth daily.      Marland Kitchen VITAMIN D3 2000 UNITS PO TABS Oral Take 1 tablet by mouth daily.      Marland Kitchen DICLOFENAC SODIUM 1 % TD GEL  Topical Apply topically 3 (three) times daily.    Marland Kitchen LANSOPRAZOLE 15 MG PO CPDR Oral Take 2 capsules (30 mg total) by mouth 2 (two) times daily. 90 capsule 0    BUYS otc  . TRIAMTERENE-HCTZ 37.5-25 MG PO TABS Oral Take 1 each (1 tablet total) by mouth daily. 90 tablet 1    Triage Vitals: BP 144/80  Pulse 92  Temp 98.8 F (37.1 C)  Resp 18  Ht 5\' 3"  (1.6 m)  Wt 222 lb (100.699 kg)  BMI 39.33 kg/m2  SpO2 97%  Physical Exam  Nursing note and vitals reviewed. Constitutional: She is oriented to person, place, and time. She appears well-developed and well-nourished. No distress.  HENT:  Head: Normocephalic and atraumatic.  Eyes: EOM are normal.  Neck: Normal range of motion.  Cardiovascular: Normal rate, regular rhythm and normal heart sounds.   Pulmonary/Chest: Effort normal and breath sounds normal.  Abdominal: Soft. She exhibits no distension. There is no tenderness.  Musculoskeletal: Normal range of motion. She exhibits edema and tenderness.       Right foot: normal DP; Normal PT pulse; Mild swelling to the right lateral malelous; Tenderness to the the right lateral malelous mainly on  the inferior superior aspect ; No mid foot instability or tenderness; No erythema or warmth. No tenderness in the base of the 5th metatarsal.    Neurological: She is alert and oriented to person, place, and time.  Skin: Skin is warm and dry.  Psychiatric: She has a normal mood and affect. Judgment normal.    ED Course  Procedures (including critical care time)  DIAGNOSTIC STUDIES: Oxygen Saturation is 97% on room air, normal by my interpretation.    COORDINATION OF CARE: 9:25AM Discussed treatment plan with pt at bedside, ankle XR and ibuprofen and percocet will be provided. Pt agreed to plan.     Labs Reviewed - No data to display Dg Ankle Complete Right  05/06/2012  *RADIOLOGY REPORT*  Clinical Data: Right ankle pain and swelling at the lateral malleolus.  RIGHT ANKLE - COMPLETE 3+ VIEW   Comparison: None.  Findings: Ankle is located.  Bony mineralization appears within normal limits.  No acute or healing fracture is identified.  Small calcaneal plantar spur.  Slight diffuse soft tissue swelling about the ankle.  IMPRESSION: 1.  Slight diffuse soft tissue swelling. 2.  No acute bony abnormality identified.   Original Report Authenticated By: Britta Mccreedy, M.D.     I personally reviewed the imaging tests through PACS system  I reviewed available ER/hospitalization records thought the EMR   1. Pain in right ankle       MDM  I suspect the patient has a right ankle sprain given her swelling and tenderness.  X-rays negative for fracture.  Neurovascularly intact.  Normal pulses.  Is not ischemia.  This is not infection.  Discharge home with PCP followup.  Home with pain medicine crutches and an ankle brace.      I personally performed the services described in this documentation, which was scribed in my presence. The recorded information has been reviewed and considered.      Lyanne Co, MD 05/06/12 1059

## 2012-05-09 DIAGNOSIS — M79609 Pain in unspecified limb: Secondary | ICD-10-CM | POA: Diagnosis not present

## 2012-05-09 DIAGNOSIS — M25579 Pain in unspecified ankle and joints of unspecified foot: Secondary | ICD-10-CM | POA: Diagnosis not present

## 2012-05-16 DIAGNOSIS — M79609 Pain in unspecified limb: Secondary | ICD-10-CM | POA: Diagnosis not present

## 2012-05-16 DIAGNOSIS — M25579 Pain in unspecified ankle and joints of unspecified foot: Secondary | ICD-10-CM | POA: Diagnosis not present

## 2012-07-13 ENCOUNTER — Encounter: Payer: Self-pay | Admitting: Family Medicine

## 2012-07-13 ENCOUNTER — Ambulatory Visit (INDEPENDENT_AMBULATORY_CARE_PROVIDER_SITE_OTHER): Payer: Medicare Other | Admitting: Family Medicine

## 2012-07-13 VITALS — BP 122/90 | HR 84 | Resp 16 | Ht 63.0 in | Wt 215.1 lb

## 2012-07-13 DIAGNOSIS — E669 Obesity, unspecified: Secondary | ICD-10-CM | POA: Diagnosis not present

## 2012-07-13 DIAGNOSIS — E785 Hyperlipidemia, unspecified: Secondary | ICD-10-CM

## 2012-07-13 DIAGNOSIS — K219 Gastro-esophageal reflux disease without esophagitis: Secondary | ICD-10-CM | POA: Diagnosis not present

## 2012-07-13 DIAGNOSIS — I10 Essential (primary) hypertension: Secondary | ICD-10-CM

## 2012-07-13 NOTE — Assessment & Plan Note (Signed)
Stable of PPI , OTC

## 2012-07-13 NOTE — Progress Notes (Signed)
  Subjective:    Patient ID: [name], female    DOB: [birth year], 71 y.o.   MRN: [id number]  HPI  Pt here to f/u chronic medical problems, no specific concerns, recently treated for tendinitis by Dr. 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  Declines flu shot  Review of Systems  GEN- denies fatigue, fever, weight loss,weakness, recent illness HEENT- denies eye drainage, change in vision, nasal discharge, CVS- denies chest pain, palpitations RESP- denies SOB, cough, wheeze ABD- denies N/V, change in stools, abd pain GU- denies dysuria, hematuria, dribbling, incontinence MSK- + joint pain, muscle aches, injury Neuro- denies headache, dizziness, syncope, seizure activity      Objective:   Physical Exam GEN- NAD, alert and oriented x3 HEENT- PERRL, EOMI, non injected sclera, pink conjunctiva, MMM, oropharynx clear Neck- Supple, no bruit CVS- RRR, no murmur RESP-CTAB ABS-NABS,soft,nt,nd EXT- No edema Pulses- Radial, DP- 2+        Assessment & Plan:

## 2012-07-13 NOTE — Patient Instructions (Signed)
Continue current medications Labs to be done before visit F/U in April 2014

## 2012-07-13 NOTE — Assessment & Plan Note (Signed)
BP looks good, no change to meds, reviewed last set of labs, fasting labs before next visit

## 2012-07-13 NOTE — Assessment & Plan Note (Addendum)
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

## 2012-11-23 ENCOUNTER — Encounter: Payer: Self-pay | Admitting: Family Medicine

## 2012-11-23 ENCOUNTER — Other Ambulatory Visit: Payer: Self-pay | Admitting: Family Medicine

## 2012-11-23 ENCOUNTER — Ambulatory Visit (INDEPENDENT_AMBULATORY_CARE_PROVIDER_SITE_OTHER): Payer: Medicare Other | Admitting: Family Medicine

## 2012-11-23 VITALS — BP 130/80 | HR 72 | Resp 18 | Ht 63.0 in | Wt 210.0 lb

## 2012-11-23 DIAGNOSIS — K219 Gastro-esophageal reflux disease without esophagitis: Secondary | ICD-10-CM

## 2012-11-23 DIAGNOSIS — I10 Essential (primary) hypertension: Secondary | ICD-10-CM | POA: Diagnosis not present

## 2012-11-23 DIAGNOSIS — E559 Vitamin D deficiency, unspecified: Secondary | ICD-10-CM | POA: Diagnosis not present

## 2012-11-23 DIAGNOSIS — E785 Hyperlipidemia, unspecified: Secondary | ICD-10-CM | POA: Diagnosis not present

## 2012-11-23 DIAGNOSIS — E669 Obesity, unspecified: Secondary | ICD-10-CM

## 2012-11-23 LAB — COMPREHENSIVE METABOLIC PANEL
AST: 35 U/L (ref 0–37)
Albumin: 4 g/dL (ref 3.5–5.2)
Alkaline Phosphatase: 67 U/L (ref 39–117)
Calcium: 10 mg/dL (ref 8.4–10.5)
Chloride: 105 mEq/L (ref 96–112)
Glucose, Bld: 77 mg/dL (ref 70–99)
Potassium: 4.3 mEq/L (ref 3.5–5.3)
Sodium: 143 mEq/L (ref 135–145)
Total Protein: 7.6 g/dL (ref 6.0–8.3)

## 2012-11-23 LAB — CBC
MCH: 32.5 pg (ref 26.0–34.0)
MCHC: 33.1 g/dL (ref 30.0–36.0)
Platelets: 290 10*3/uL (ref 150–400)
RBC: 4.18 MIL/uL (ref 3.87–5.11)

## 2012-11-23 LAB — LIPID PANEL
LDL Cholesterol: 102 mg/dL — ABNORMAL HIGH (ref 0–99)
Total CHOL/HDL Ratio: 2.9 Ratio

## 2012-11-23 MED ORDER — PANTOPRAZOLE SODIUM 40 MG PO TBEC: 40.0000 mg | DELAYED_RELEASE_TABLET | Freq: Every day | ORAL | Status: DC

## 2012-11-23 NOTE — Patient Instructions (Addendum)
Start protonix Get the labs done fasting today Avoid spicy and fried foods Call if you do not improve F/U 4 months

## 2012-11-24 LAB — VITAMIN D 25 HYDROXY (VIT D DEFICIENCY, FRACTURES): Vit D, 25-Hydroxy: 26 ng/mL — ABNORMAL LOW (ref 30–89)

## 2012-11-25 ENCOUNTER — Encounter: Payer: Self-pay | Admitting: Family Medicine

## 2012-11-25 NOTE — Assessment & Plan Note (Addendum)
She continues to work and exercises some, avoid gaining more weight with her OA , she has lost 5lbs

## 2012-11-25 NOTE — Assessment & Plan Note (Signed)
Benign exam, D/C prevacid start protonix

## 2012-11-25 NOTE — Progress Notes (Signed)
  Subjective:    Patient ID: [name], female    DOB: [birth year], 72 y.o.   MRN: [id number]  HPI  Pt here to f/u chronic medical problems. Has had increased epigastric pressure and pain for the past month, feels need to belch, has burning sensation like her previous GERD, worse with fatty foods or spicy foods, no N/V, Chest pain, SOB.    Review of Systems - per above   GEN- denies fatigue, fever, weight loss,weakness, recent illness HEENT- denies eye drainage, change in vision, nasal discharge, CVS- denies chest pain, palpitations RESP- denies SOB, cough, wheeze ABD- denies N/V, change in stools, + abd pain GU- denies dysuria, hematuria, dribbling, incontinence MSK- denies joint pain, muscle aches, injury Neuro- denies headache, dizziness, syncope, seizure activity      Objective:   Physical Exam  GEN- NAD, alert and oriented x3 HEENT- PERRL, EOMI, non injected sclera, pink conjunctiva, MMM, oropharynx clear Neck- Supple, CVS- RRR, no murmur RESP-CTAB ABD-NABS,soft,NT,ND EXT- No edema Pulses- Radial, DP- 2+       Assessment & Plan:

## 2012-11-25 NOTE — Assessment & Plan Note (Signed)
Recheck FLP no current meds

## 2012-11-25 NOTE — Assessment & Plan Note (Signed)
Well controlled 

## 2012-11-27 MED ORDER — ERGOCALCIFEROL 1.25 MG (50000 UT) PO CAPS: 50000.0000 [IU] | ORAL_CAPSULE | ORAL | Status: DC

## 2012-11-27 NOTE — Addendum Note (Signed)
Addended by: Milinda Antis F on: 11/27/2012 08:37 AM   Modules accepted: Orders

## 2012-12-11 ENCOUNTER — Ambulatory Visit: Payer: Medicare Other | Admitting: Family Medicine

## 2013-01-01 ENCOUNTER — Other Ambulatory Visit: Payer: Self-pay | Admitting: Family Medicine

## 2013-01-04 DIAGNOSIS — H1045 Other chronic allergic conjunctivitis: Secondary | ICD-10-CM | POA: Diagnosis not present

## 2013-01-04 DIAGNOSIS — H2510 Age-related nuclear cataract, unspecified eye: Secondary | ICD-10-CM | POA: Diagnosis not present

## 2013-03-25 ENCOUNTER — Ambulatory Visit: Payer: Medicare Other | Admitting: Family Medicine

## 2013-04-12 ENCOUNTER — Encounter: Payer: Self-pay | Admitting: Family Medicine

## 2013-04-12 ENCOUNTER — Ambulatory Visit (INDEPENDENT_AMBULATORY_CARE_PROVIDER_SITE_OTHER): Payer: Medicare Other | Admitting: Family Medicine

## 2013-04-12 VITALS — BP 126/84 | HR 78 | Temp 98.0°F | Resp 18 | Ht 63.0 in | Wt 207.0 lb

## 2013-04-12 DIAGNOSIS — M1710 Unilateral primary osteoarthritis, unspecified knee: Secondary | ICD-10-CM | POA: Diagnosis not present

## 2013-04-12 DIAGNOSIS — E559 Vitamin D deficiency, unspecified: Secondary | ICD-10-CM

## 2013-04-12 DIAGNOSIS — I10 Essential (primary) hypertension: Secondary | ICD-10-CM

## 2013-04-12 DIAGNOSIS — M179 Osteoarthritis of knee, unspecified: Secondary | ICD-10-CM

## 2013-04-12 DIAGNOSIS — IMO0002 Reserved for concepts with insufficient information to code with codable children: Secondary | ICD-10-CM | POA: Diagnosis not present

## 2013-04-12 MED ORDER — DICLOFENAC SODIUM 1 % TD GEL: 2.0000 g | Freq: Four times a day (QID) | TRANSDERMAL | Status: DC

## 2013-04-12 MED ORDER — PANTOPRAZOLE SODIUM 40 MG PO TBEC: 40.0000 mg | DELAYED_RELEASE_TABLET | Freq: Every day | ORAL | Status: DC

## 2013-04-12 MED ORDER — TRIAMTERENE-HCTZ 37.5-25 MG PO TABS: ORAL_TABLET | ORAL | Status: DC

## 2013-04-12 NOTE — Progress Notes (Signed)
  Subjective:    Patient ID: [name], female    DOB: 12-Mar-1941, 72 y.o.   MRN: [id number]  HPI Pt here to f/u chronic medical problems, no specific concerns. Tolerating medications Needs refills.  Doing well with arthritis in knees, uses voltaren gel. 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   Review of Systems   GEN- denies fatigue, fever, weight loss,weakness, recent illness HEENT- denies eye drainage, change in vision, nasal discharge, CVS- denies chest pain, palpitations RESP- denies SOB, cough, wheeze ABD- denies N/V, change in stools, abd pain GU- denies dysuria, hematuria, dribbling, incontinence MSK- denies joint pain, muscle aches, injury Neuro- denies headache, dizziness, syncope, seizure activity      Objective:   Physical Exam GEN- NAD, alert and oriented x3 HEENT- PERRL, EOMI, non injected sclera, pink conjunctiva, MMM, oropharynx clear Neck- Supple, no thryomegaly CVS- RRR, no murmur RESP-CTAB EXT- No edema Pulses- Radial, DP- 2+       Assessment & Plan:

## 2013-04-12 NOTE — Patient Instructions (Addendum)
Continue current medications  Calcium (1200mg ) and Vitamin D  (800IU) F/U 6 months

## 2013-04-14 ENCOUNTER — Encounter: Payer: Self-pay | Admitting: Family Medicine

## 2013-04-14 DIAGNOSIS — E559 Vitamin D deficiency, unspecified: Secondary | ICD-10-CM | POA: Insufficient documentation

## 2013-04-14 NOTE — Assessment & Plan Note (Signed)
Well controlled, no change to meds 

## 2013-04-14 NOTE — Assessment & Plan Note (Signed)
Doing well with topical gels

## 2013-04-14 NOTE — Assessment & Plan Note (Signed)
Will now place on maintance Vit D

## 2013-05-28 DIAGNOSIS — M25549 Pain in joints of unspecified hand: Secondary | ICD-10-CM | POA: Diagnosis not present

## 2013-06-06 ENCOUNTER — Other Ambulatory Visit (HOSPITAL_COMMUNITY)
Admission: RE | Admit: 2013-06-06 | Discharge: 2013-06-06 | Disposition: A | Discharge status: Home / Self Care | Payer: Medicare Other | Source: Ambulatory Visit | Attending: Orthopaedic Surgery | Admitting: Orthopaedic Surgery

## 2013-06-06 DIAGNOSIS — M6530 Trigger finger, unspecified finger: Secondary | ICD-10-CM | POA: Diagnosis not present

## 2013-10-14 ENCOUNTER — Ambulatory Visit: Payer: Medicare Other | Admitting: Family Medicine

## 2013-11-25 ENCOUNTER — Encounter: Payer: Self-pay | Admitting: Family Medicine

## 2013-11-25 ENCOUNTER — Ambulatory Visit (INDEPENDENT_AMBULATORY_CARE_PROVIDER_SITE_OTHER): Payer: Medicare Other | Admitting: Family Medicine

## 2013-11-25 VITALS — BP 140/78 | HR 64 | Temp 97.4°F | Resp 12 | Ht 61.0 in | Wt 210.0 lb

## 2013-11-25 DIAGNOSIS — E785 Hyperlipidemia, unspecified: Secondary | ICD-10-CM

## 2013-11-25 DIAGNOSIS — R209 Unspecified disturbances of skin sensation: Secondary | ICD-10-CM | POA: Diagnosis not present

## 2013-11-25 DIAGNOSIS — I10 Essential (primary) hypertension: Secondary | ICD-10-CM | POA: Diagnosis not present

## 2013-11-25 DIAGNOSIS — E559 Vitamin D deficiency, unspecified: Secondary | ICD-10-CM | POA: Diagnosis not present

## 2013-11-25 DIAGNOSIS — R202 Paresthesia of skin: Secondary | ICD-10-CM

## 2013-11-25 LAB — CBC WITH DIFFERENTIAL/PLATELET
BASOS ABS: 0 10*3/uL (ref 0.0–0.1)
BASOS PCT: 0 % (ref 0–1)
EOS PCT: 1 % (ref 0–5)
Eosinophils Absolute: 0.1 10*3/uL (ref 0.0–0.7)
HCT: 37.8 % (ref 36.0–46.0)
Hemoglobin: 12.7 g/dL (ref 12.0–15.0)
Lymphocytes Relative: 43 % (ref 12–46)
Lymphs Abs: 2.4 10*3/uL (ref 0.7–4.0)
MCH: 32.9 pg (ref 26.0–34.0)
MCHC: 33.6 g/dL (ref 30.0–36.0)
MCV: 97.9 fL (ref 78.0–100.0)
MONO ABS: 0.3 10*3/uL (ref 0.1–1.0)
Monocytes Relative: 5 % (ref 3–12)
Neutro Abs: 2.9 10*3/uL (ref 1.7–7.7)
Neutrophils Relative %: 51 % (ref 43–77)
Platelets: 339 10*3/uL (ref 150–400)
RBC: 3.86 MIL/uL — ABNORMAL LOW (ref 3.87–5.11)
RDW: 12.2 % (ref 11.5–15.5)
WBC: 5.6 10*3/uL (ref 4.0–10.5)

## 2013-11-25 MED ORDER — TRIAMTERENE-HCTZ 37.5-25 MG PO TABS: ORAL_TABLET | ORAL | Status: DC

## 2013-11-25 MED ORDER — DICLOFENAC SODIUM 1 % TD GEL: 2.0000 g | Freq: Four times a day (QID) | TRANSDERMAL | Status: DC

## 2013-11-25 NOTE — Patient Instructions (Signed)
Continue current medications We will send a letter with lab results Call if back pain gets worse F/U 6 months

## 2013-11-26 ENCOUNTER — Encounter: Payer: Self-pay | Admitting: Family Medicine

## 2013-11-26 DIAGNOSIS — R202 Paresthesia of skin: Secondary | ICD-10-CM | POA: Insufficient documentation

## 2013-11-26 LAB — COMPREHENSIVE METABOLIC PANEL
ALK PHOS: 56 U/L (ref 39–117)
ALT: 15 U/L (ref 0–35)
AST: 34 U/L (ref 0–37)
Albumin: 3.7 g/dL (ref 3.5–5.2)
BUN: 28 mg/dL — AB (ref 6–23)
CALCIUM: 9.3 mg/dL (ref 8.4–10.5)
CHLORIDE: 104 meq/L (ref 96–112)
CO2: 26 mEq/L (ref 19–32)
CREATININE: 1.28 mg/dL — AB (ref 0.50–1.10)
Glucose, Bld: 76 mg/dL (ref 70–99)
Potassium: 4.3 mEq/L (ref 3.5–5.3)
Sodium: 140 mEq/L (ref 135–145)
Total Bilirubin: 0.3 mg/dL (ref 0.2–1.2)
Total Protein: 7.4 g/dL (ref 6.0–8.3)

## 2013-11-26 LAB — LIPID PANEL
CHOL/HDL RATIO: 2.7 ratio
Cholesterol: 174 mg/dL (ref 0–200)
HDL: 65 mg/dL (ref >39)
LDL Cholesterol: 89 mg/dL (ref 0–99)
Triglycerides: 101 mg/dL (ref <150)
VLDL: 20 mg/dL (ref 0–40)

## 2013-11-26 LAB — VITAMIN D 25 HYDROXY (VIT D DEFICIENCY, FRACTURES): Vit D, 25-Hydroxy: 33 ng/mL (ref 30–89)

## 2013-11-26 NOTE — Assessment & Plan Note (Signed)
I think this could be related to a pinched nerve in her back or some spinal stenosis however she's not having any back or hip pain at this time she just wants to monitor it she states she's had in the past it is not affecting her daily activities. The next step will be to obtain imaging of the L-spine as well as the hips

## 2013-11-26 NOTE — Assessment & Plan Note (Signed)
I will check her cholesterol she is not fasting

## 2013-11-26 NOTE — Progress Notes (Signed)
Patient ID: [name], female   DOB: 10-Jul-1941, 73 y.o.   MRN: [id number]   Subjective:    Patient ID: [name], female    DOB: 1941/01/27, 73 y.o.   MRN: [id number]  Patient presents for 6 month F/U and Numbness in B legss on outer thighs  patient here follow chronic medical problems. She does note over the past couple of weeks she's had some numbness in both thighs on the lateral parts. 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 She's not had any change in bowel or bladder denies any tingling numbness in the feet She's taking her medication as prescribed. She said that she had this in the past and was told that it was due to her weight. She does use topical full tear in jail for her joints and this helps. She is due for fasting labs today.    Review Of Systems:  GEN- denies fatigue, fever, weight loss,weakness, recent illness HEENT- denies eye drainage, change in vision, nasal discharge, CVS- denies chest pain, palpitations RESP- denies SOB, cough, wheeze ABD- denies N/V, change in stools, abd pain GU- denies dysuria, hematuria, dribbling, incontinence MSK- denies joint pain, muscle aches, injury Neuro- denies headache, dizziness, syncope, seizure activity       Objective:    BP 140/78  Pulse 64  Temp(Src) 97.4 F (36.3 C) (Oral)  Resp 12  Ht 5\' 1"  (1.549 m)  Wt 210 lb (95.255 kg)  BMI 39.70 kg/m2 GEN- NAD, alert and oriented x3 HEENT- PERRL, EOMI, non injected sclera, pink conjunctiva, MMM, oropharynx clear Neck- Supple, CVS- RRR, no murmur RESP-CTAB ABD-NABS,soft,NT,ND EXT- No edema Pulses- Radial, DP- 2+ MSK- Spine NT, fair ROM back, Hips, Knees, neg SLR,motor equal bilat Neuro- Tone equal bilat LE, sensation grossly in tact, DTR symmetric, gait non antalgic        Assessment & Plan:      Problem List Items Addressed This Visit   Unspecified vitamin D deficiency   Relevant Orders       Vit D  25 hydroxy (rtn osteoporosis monitoring) (Completed)   Paresthesia of bilateral legs     I think this could be related to a pinched nerve in her back or some spinal stenosis however she's not having any back or hip pain at this time she just wants to monitor it she states she's had in the past it is not affecting her daily activities. The next step will be to obtain imaging of the L-spine as well as the hips    Mild hyperlipidemia     I will check her cholesterol she is not fasting    Relevant Medications      triamterene-hydrochlorothiazide (MAXZIDE-25) 37.5-25 MG per tablet   Essential hypertension, benign - Primary     Blood pressure is well-controlled based on her age no change to meds I will obtain her labs    Relevant Orders      CBC with Differential (Completed)      Comprehensive metabolic panel (Completed)      Lipid panel (Completed)      Note: This dictation was prepared with Dragon dictation along with smaller phrase technology. Any transcriptional errors that result from this process are unintentional.

## 2013-11-26 NOTE — Assessment & Plan Note (Signed)
Blood pressure is well-controlled based on her age no change to meds I will obtain her labs

## 2013-11-27 ENCOUNTER — Encounter: Payer: Self-pay | Admitting: *Deleted

## 2014-02-14 ENCOUNTER — Encounter: Payer: Self-pay | Admitting: Family Medicine

## 2014-02-14 ENCOUNTER — Ambulatory Visit (INDEPENDENT_AMBULATORY_CARE_PROVIDER_SITE_OTHER): Payer: Medicare Other | Admitting: Family Medicine

## 2014-02-14 VITALS — BP 140/82 | HR 88 | Temp 97.6°F | Resp 16 | Ht 61.0 in | Wt 214.0 lb

## 2014-02-14 DIAGNOSIS — D1779 Benign lipomatous neoplasm of other sites: Secondary | ICD-10-CM | POA: Diagnosis not present

## 2014-02-14 DIAGNOSIS — M25512 Pain in left shoulder: Secondary | ICD-10-CM

## 2014-02-14 DIAGNOSIS — M25519 Pain in unspecified shoulder: Secondary | ICD-10-CM | POA: Diagnosis not present

## 2014-02-14 DIAGNOSIS — D171 Benign lipomatous neoplasm of skin and subcutaneous tissue of trunk: Secondary | ICD-10-CM

## 2014-02-14 MED ORDER — NAPROXEN 500 MG PO TABS
500.0000 mg | ORAL_TABLET | Freq: Two times a day (BID) | ORAL | Status: DC
Start: 2014-02-14 — End: 2014-11-26

## 2014-02-14 MED ORDER — DICLOFENAC SODIUM 1 % TD GEL: 2.0000 g | Freq: Four times a day (QID) | TRANSDERMAL | Status: AC

## 2014-02-14 NOTE — Progress Notes (Signed)
Patient ID: [name], female   DOB: 26-Apr-1941, 73 y.o.   MRN: [id number]   Subjective:    Patient ID: [name], female    DOB: 26-Apr-1941, 73 y.o.   MRN: [id number]  Patient presents for Area on back  patient here with concern for pain over lipoma. 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  Left shoulder pain the past few months she's also had left shoulder pain. She initially thought it was the weather during the wintertime therefore did not seek care. She's not taking any over-the-counter medications for this. She ran out of her diclofenac gel. She has pain when she tries to raise her arm up and cannot fully getting up above shoulder level also has difficulty when reaching towards her bra. 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    Review Of Systems:  GEN- denies fatigue, fever, weight loss,weakness, recent illness HEENT- denies eye drainage, change in vision, nasal discharge, CVS- denies chest pain, palpitations RESP- denies SOB, cough, wheeze MSK- + joint pain, muscle aches, injury Neuro- denies headache, dizziness, syncope, seizure activity       Objective:    BP 140/82  Pulse 88  Temp(Src) 97.6 F (36.4 C) (Oral)  Resp 16  Ht 5\' 1"  (1.549 m)  Wt 214 lb (97.07 kg)  BMI 40.46 kg/m2 GEN- NAD, alert and oriented x3 Neck- Supple,no LAD, good ROM CVS- RRR, no murmur RESP-CTAB Skin- 2-3 cm right of upper thoracic spine- fatty tumor 6x7cm, mild TTP, no erythema, no fluctance MSK- normal inspection of bilat shoulder, decreased ROM left shoulder , unable to raise to shoulder height, +empty can left side, biceps in tact, normal tone Neuro- Tone equal bilat UE, sensation grossly  in tact,       Assessment & Plan:      Problem List Items Addressed This Visit   None    Visit Diagnoses   Pain in joint, shoulder region, left    -  Primary    Decreased ROM, concern for rotator cuff strain or tear based on her work duties, start naprosyn, xray, refer to ortho    Relevant Orders       DG Shoulder Left    Lipoma of back        Refer for removal as causing discomfort/pain       Note: This dictation was prepared with Dragon dictation along with smaller phrase technology. Any transcriptional errors that result from this process are unintentional.

## 2014-02-14 NOTE — Patient Instructions (Signed)
Go get xrays of your shoulder Start the naprosyn pill twice a day for inflammation/arthritis REferral to Dr. Luna Glasgow Referral to Dr. Arnoldo Morale for the lipoma  /FU as previous

## 2014-02-17 ENCOUNTER — Ambulatory Visit (HOSPITAL_COMMUNITY)
Admission: RE | Admit: 2014-02-17 | Discharge: 2014-02-17 | Disposition: A | Discharge status: Home / Self Care | Payer: Medicare Other | Source: Ambulatory Visit | Attending: Family Medicine | Admitting: Family Medicine

## 2014-02-17 DIAGNOSIS — M898X9 Other specified disorders of bone, unspecified site: Secondary | ICD-10-CM | POA: Insufficient documentation

## 2014-02-17 DIAGNOSIS — M19019 Primary osteoarthritis, unspecified shoulder: Secondary | ICD-10-CM | POA: Insufficient documentation

## 2014-02-17 DIAGNOSIS — M25512 Pain in left shoulder: Secondary | ICD-10-CM

## 2014-02-25 DIAGNOSIS — M25519 Pain in unspecified shoulder: Secondary | ICD-10-CM | POA: Diagnosis not present

## 2014-03-03 DIAGNOSIS — IMO0001 Reserved for inherently not codable concepts without codable children: Secondary | ICD-10-CM | POA: Diagnosis not present

## 2014-03-03 DIAGNOSIS — M25519 Pain in unspecified shoulder: Secondary | ICD-10-CM | POA: Diagnosis not present

## 2014-03-06 DIAGNOSIS — M25519 Pain in unspecified shoulder: Secondary | ICD-10-CM | POA: Diagnosis not present

## 2014-03-06 DIAGNOSIS — IMO0001 Reserved for inherently not codable concepts without codable children: Secondary | ICD-10-CM | POA: Diagnosis not present

## 2014-03-11 DIAGNOSIS — D170 Benign lipomatous neoplasm of skin and subcutaneous tissue of head, face and neck: Secondary | ICD-10-CM | POA: Diagnosis not present

## 2014-03-11 DIAGNOSIS — IMO0001 Reserved for inherently not codable concepts without codable children: Secondary | ICD-10-CM | POA: Diagnosis not present

## 2014-03-11 DIAGNOSIS — M25519 Pain in unspecified shoulder: Secondary | ICD-10-CM | POA: Diagnosis not present

## 2014-03-13 DIAGNOSIS — IMO0001 Reserved for inherently not codable concepts without codable children: Secondary | ICD-10-CM | POA: Diagnosis not present

## 2014-03-13 DIAGNOSIS — M25519 Pain in unspecified shoulder: Secondary | ICD-10-CM | POA: Diagnosis not present

## 2014-03-14 NOTE — H&P (Signed)
  NTS SOAP Note  Vital Signs:  Vitals as of: 9/37/3428: Systolic 768: Diastolic 93: Heart Rate 79: Temp 98.52F: Height 42ft 3in: Weight 216Lbs 0 Ounces: BMI 38.26  BMI : 38.26 kg/m2  Subjective: This 73 Years 2 Months old Female presents for excision of a large subcutaneous mass on her back.  Has been present for some time, but is increasing in size.  Tender to touch and painful with movement of her left shoulder.   Review of Symptoms:  Constitutional:unremarkable   Head:unremarkable    Eyes:unremarkable   Nose/Mouth/Throat:unremarkable Cardiovascular:  unremarkable   Respiratory:unremarkable   Gastrointestinal:  unremarkable   Genitourinary:unremarkable     Musculoskeletal:unremarkable   Skin:unremarkable Hematolgic/Lymphatic:unremarkable     Allergic/Immunologic:unremarkable     Past Medical History:    Reviewed  Past Medical History  Surgical History: none Medical Problems: HTN Allergies: nkda Medications: baby asa, triamterene/HCTZ, naproxen   Social History:Reviewed  Social History  Preferred Language: English Race:  Black or African American Ethnicity: Not Hispanic / Latino Age: 73 Years 2 Months Marital Status:  S Alcohol: no   Smoking Status: Never smoker reviewed on 03/11/2014 Functional Status reviewed on 03/11/2014 ------------------------------------------------ Bathing: Normal Cooking: Normal Dressing: Normal Driving: Normal Eating: Normal Managing Meds: Normal Oral Care: Normal Shopping: Normal Toileting: Normal Transferring: Normal Walking: Normal Cognitive Status reviewed on 03/11/2014 ------------------------------------------------ Attention: Normal Decision Making: Normal Language: Normal Memory: Normal Motor: Normal Perception: Normal Problem Solving: Normal Visual and Spatial: Normal   Family History:  Reviewed  Family Health History Mother, Deceased; Cancer unspecified;   Father, Deceased; Diabetes mellitus, unspecified type;     Objective Information: General:  Well appearing, well nourished in no distress.      Large, tender, >5cm subcutaneous rubbery mass noted in right upper back, close to scapula. Heart:  RRR, no murmur or gallop.  Normal S1, S2.  No S3, S4.  Lungs:    CTA bilaterally, no wheezes, rhonchi, rales.  Breathing unlabored.  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   Patient Education:Alternative treatments to surgery were discussed with patient (and family).  Risks and benefits  of procedure were fully explained to the patient (and family) who gave informed consent. Patient/family questions were addressed.  Follow-up:Pending Surgery

## 2014-03-18 DIAGNOSIS — M25519 Pain in unspecified shoulder: Secondary | ICD-10-CM | POA: Diagnosis not present

## 2014-03-20 DIAGNOSIS — M25519 Pain in unspecified shoulder: Secondary | ICD-10-CM | POA: Diagnosis not present

## 2014-03-20 DIAGNOSIS — IMO0001 Reserved for inherently not codable concepts without codable children: Secondary | ICD-10-CM | POA: Diagnosis not present

## 2014-03-27 DIAGNOSIS — M25519 Pain in unspecified shoulder: Secondary | ICD-10-CM | POA: Diagnosis not present

## 2014-03-27 DIAGNOSIS — IMO0001 Reserved for inherently not codable concepts without codable children: Secondary | ICD-10-CM | POA: Diagnosis not present

## 2014-03-28 ENCOUNTER — Encounter (HOSPITAL_COMMUNITY): Payer: Self-pay | Admitting: Pharmacy Technician

## 2014-03-31 DIAGNOSIS — M25519 Pain in unspecified shoulder: Secondary | ICD-10-CM | POA: Diagnosis not present

## 2014-03-31 DIAGNOSIS — IMO0001 Reserved for inherently not codable concepts without codable children: Secondary | ICD-10-CM | POA: Diagnosis not present

## 2014-04-04 NOTE — Patient Instructions (Signed)
[name]  04/04/2014   Your procedure is scheduled on:  04/11/2014  Report to [facility] at 6:15 AM.  Call this number if you have problems the morning of surgery: [phone]   Remember:   Do not eat food or drink liquids after midnight.   Take these medicines the morning of surgery with A SIP OF WATER: Protonix   Do not wear jewelry, make-up or nail polish.  Do not wear lotions, powders, or perfumes. You may wear deodorant.  Do not shave 48 hours prior to surgery. Men may shave face and neck.  Do not bring valuables to the hospital.  [facility] is not responsible for any belongings or valuables.               Contacts, dentures or bridgework may not be worn into surgery.  Leave suitcase in the car. After surgery it may be brought to your room.  For patients admitted to the hospital, discharge time is determined by your treatment team.               Patients discharged the day of surgery will not be allowed to drive home.  Name and phone number of your driver:   Special Instructions: Shower using CHG 2 nights before surgery and the night before surgery.  If you shower the day of surgery use CHG.  Use special wash - you have one bottle of CHG for all showers.  You should use approximately 1/3 of the bottle for each shower.   Please read over the following fact sheets that you were given: Surgical Site Infection Prevention and Anesthesia Post-op Instructions  PATIENT INSTRUCTIONS POST-ANESTHESIA  IMMEDIATELY FOLLOWING SURGERY:  Do not drive or operate machinery for the first twenty four hours after surgery.  Do not make any important decisions for twenty four hours after surgery or while taking narcotic pain medications or sedatives.  If you develop intractable nausea and vomiting or a severe headache please notify your doctor immediately.  FOLLOW-UP:  Please make an appointment with your surgeon as instructed. You do not need to follow up with anesthesia unless specifically  instructed to do so.  WOUND CARE INSTRUCTIONS (if applicable):  Keep a dry clean dressing on the anesthesia/puncture wound site if there is drainage.  Once the wound has quit draining you may leave it open to air.  Generally you should leave the bandage intact for twenty four hours unless there is drainage.  If the epidural site drains for more than 36-48 hours please call the anesthesia department.  QUESTIONS?:  Please feel free to call your physician or the hospital operator if you have any questions, and they will be happy to assist you.

## 2014-04-07 ENCOUNTER — Encounter (HOSPITAL_COMMUNITY)
Admission: RE | Admit: 2014-04-07 | Discharge: 2014-04-07 | Disposition: A | Discharge status: Home / Self Care | Payer: Medicare Other | Source: Ambulatory Visit | Attending: General Surgery | Admitting: General Surgery

## 2014-04-07 ENCOUNTER — Other Ambulatory Visit: Payer: Self-pay

## 2014-04-07 ENCOUNTER — Encounter (HOSPITAL_COMMUNITY): Payer: Self-pay

## 2014-04-07 DIAGNOSIS — R9431 Abnormal electrocardiogram [ECG] [EKG]: Secondary | ICD-10-CM | POA: Insufficient documentation

## 2014-04-07 DIAGNOSIS — Z01812 Encounter for preprocedural laboratory examination: Secondary | ICD-10-CM | POA: Insufficient documentation

## 2014-04-07 DIAGNOSIS — Z01810 Encounter for preprocedural cardiovascular examination: Secondary | ICD-10-CM | POA: Diagnosis present

## 2014-04-07 HISTORY — DX: Shortness of breath: R06.02

## 2014-04-07 LAB — CBC WITH DIFFERENTIAL/PLATELET
BASOS ABS: 0 10*3/uL (ref 0.0–0.1)
Basophils Relative: 0 % (ref 0–1)
EOS ABS: 0.1 10*3/uL (ref 0.0–0.7)
Eosinophils Relative: 1 % (ref 0–5)
HEMATOCRIT: 39 % (ref 36.0–46.0)
Hemoglobin: 12.9 g/dL (ref 12.0–15.0)
Lymphocytes Relative: 44 % (ref 12–46)
Lymphs Abs: 2.7 10*3/uL (ref 0.7–4.0)
MCH: 32.8 pg (ref 26.0–34.0)
MCHC: 33.1 g/dL (ref 30.0–36.0)
MCV: 99.2 fL (ref 78.0–100.0)
Monocytes Absolute: 0.4 10*3/uL (ref 0.1–1.0)
Monocytes Relative: 6 % (ref 3–12)
Neutro Abs: 3.1 10*3/uL (ref 1.7–7.7)
Neutrophils Relative %: 49 % (ref 43–77)
Platelets: 283 10*3/uL (ref 150–400)
RBC: 3.93 MIL/uL (ref 3.87–5.11)
RDW: 12.1 % (ref 11.5–15.5)
WBC: 6.3 10*3/uL (ref 4.0–10.5)

## 2014-04-07 LAB — BASIC METABOLIC PANEL
ANION GAP: 11 (ref 5–15)
BUN: 20 mg/dL (ref 6–23)
CO2: 28 meq/L (ref 19–32)
CREATININE: 1.12 mg/dL — AB (ref 0.50–1.10)
Calcium: 10.1 mg/dL (ref 8.4–10.5)
Chloride: 105 mEq/L (ref 96–112)
GFR calc Af Amer: 56 mL/min — ABNORMAL LOW (ref >90)
GFR calc non Af Amer: 48 mL/min — ABNORMAL LOW (ref >90)
Glucose, Bld: 83 mg/dL (ref 70–99)
Potassium: 4.9 mEq/L (ref 3.7–5.3)
Sodium: 144 mEq/L (ref 137–147)

## 2014-04-07 NOTE — Progress Notes (Signed)
04/07/14 1451  OBSTRUCTIVE SLEEP APNEA  Have you ever been diagnosed with sleep apnea through a sleep study? No  Do you snore loudly (loud enough to be heard through closed doors)?  1  Do you often feel tired, fatigued, or sleepy during the daytime? 0  Has anyone observed you stop breathing during your sleep? 0  Do you have, or are you being treated for high blood pressure? 1  BMI more than 35 kg/m2? 1  Age over 71 years old? 1  Neck circumference greater than 40 cm/16 inches? 0  Gender: 0  Obstructive Sleep Apnea Score 4  Score 4 or greater  Results sent to PCP

## 2014-04-07 NOTE — Pre-Procedure Instructions (Signed)
Patient given information to sign up for my chart at home. 

## 2014-04-08 DIAGNOSIS — M25519 Pain in unspecified shoulder: Secondary | ICD-10-CM | POA: Diagnosis not present

## 2014-04-11 ENCOUNTER — Ambulatory Visit (HOSPITAL_COMMUNITY): Payer: Medicare Other | Admitting: Anesthesiology

## 2014-04-11 ENCOUNTER — Encounter (HOSPITAL_COMMUNITY)
Admission: RE | Disposition: A | Discharge status: Home / Self Care | Payer: Self-pay | Source: Ambulatory Visit | Attending: General Surgery

## 2014-04-11 ENCOUNTER — Encounter (HOSPITAL_COMMUNITY): Payer: Medicare Other | Admitting: Anesthesiology

## 2014-04-11 ENCOUNTER — Encounter (HOSPITAL_COMMUNITY): Payer: Self-pay | Admitting: *Deleted

## 2014-04-11 ENCOUNTER — Ambulatory Visit (HOSPITAL_COMMUNITY)
Admission: RE | Admit: 2014-04-11 | Discharge: 2014-04-11 | Disposition: A | Discharge status: Home / Self Care | Payer: Medicare Other | Source: Ambulatory Visit | Attending: General Surgery | Admitting: General Surgery

## 2014-04-11 DIAGNOSIS — N289 Disorder of kidney and ureter, unspecified: Secondary | ICD-10-CM | POA: Insufficient documentation

## 2014-04-11 DIAGNOSIS — I10 Essential (primary) hypertension: Secondary | ICD-10-CM | POA: Diagnosis not present

## 2014-04-11 DIAGNOSIS — D1779 Benign lipomatous neoplasm of other sites: Secondary | ICD-10-CM | POA: Insufficient documentation

## 2014-04-11 DIAGNOSIS — Z79899 Other long term (current) drug therapy: Secondary | ICD-10-CM | POA: Insufficient documentation

## 2014-04-11 DIAGNOSIS — K219 Gastro-esophageal reflux disease without esophagitis: Secondary | ICD-10-CM | POA: Diagnosis not present

## 2014-04-11 DIAGNOSIS — D1739 Benign lipomatous neoplasm of skin and subcutaneous tissue of other sites: Secondary | ICD-10-CM | POA: Diagnosis not present

## 2014-04-11 DIAGNOSIS — D216 Benign neoplasm of connective and other soft tissue of trunk, unspecified: Secondary | ICD-10-CM | POA: Diagnosis not present

## 2014-04-11 HISTORY — PX: MASS EXCISION: SHX2000

## 2014-04-11 SURGERY — EXCISION MASS
Anesthesia: General | Site: Back

## 2014-04-11 MED ORDER — MIDAZOLAM HCL 2 MG/2ML IJ SOLN
INTRAMUSCULAR | Status: AC
  Filled 2014-04-11: qty 2

## 2014-04-11 MED ORDER — BUPIVACAINE HCL (PF) 0.5 % IJ SOLN
INTRAMUSCULAR | Status: DC | PRN
  Administered 2014-04-11: 7 mL

## 2014-04-11 MED ORDER — PROPOFOL 10 MG/ML IV BOLUS
INTRAVENOUS | Status: DC | PRN
  Administered 2014-04-11: 150 mg via INTRAVENOUS

## 2014-04-11 MED ORDER — ONDANSETRON HCL 4 MG/2ML IJ SOLN
4.0000 mg | Freq: Once | INTRAMUSCULAR | Status: AC
  Administered 2014-04-11: 4 mg via INTRAVENOUS
  Filled 2014-04-11: qty 2

## 2014-04-11 MED ORDER — HYDROCODONE-ACETAMINOPHEN 5-325 MG PO TABS: 1.0000 | ORAL_TABLET | Freq: Four times a day (QID) | ORAL | Status: DC | PRN

## 2014-04-11 MED ORDER — BUPIVACAINE HCL (PF) 0.5 % IJ SOLN
INTRAMUSCULAR | Status: AC
  Filled 2014-04-11: qty 30

## 2014-04-11 MED ORDER — 0.9 % SODIUM CHLORIDE (POUR BTL) OPTIME
TOPICAL | Status: DC | PRN
  Administered 2014-04-11: 1000 mL

## 2014-04-11 MED ORDER — PROPOFOL 10 MG/ML IV EMUL
INTRAVENOUS | Status: AC
  Filled 2014-04-11: qty 20

## 2014-04-11 MED ORDER — FENTANYL CITRATE 0.05 MG/ML IJ SOLN: 25.0000 ug | INTRAMUSCULAR | Status: DC | PRN

## 2014-04-11 MED ORDER — KETOROLAC TROMETHAMINE 30 MG/ML IJ SOLN
30.0000 mg | Freq: Once | INTRAMUSCULAR | Status: AC
  Administered 2014-04-11: 30 mg via INTRAVENOUS

## 2014-04-11 MED ORDER — LIDOCAINE HCL (PF) 1 % IJ SOLN
INTRAMUSCULAR | Status: AC
  Filled 2014-04-11: qty 5

## 2014-04-11 MED ORDER — CHLORHEXIDINE GLUCONATE 4 % EX LIQD: 1.0000 "application " | Freq: Once | CUTANEOUS | Status: DC

## 2014-04-11 MED ORDER — FENTANYL CITRATE 0.05 MG/ML IJ SOLN
25.0000 ug | INTRAMUSCULAR | Status: AC
  Administered 2014-04-11 (×2): 25 ug via INTRAVENOUS

## 2014-04-11 MED ORDER — LACTATED RINGERS IV SOLN
INTRAVENOUS | Status: DC
Start: 2014-04-11 — End: 2014-04-11

## 2014-04-11 MED ORDER — ONDANSETRON HCL 4 MG/2ML IJ SOLN: 4.0000 mg | Freq: Once | INTRAMUSCULAR | Status: DC | PRN

## 2014-04-11 MED ORDER — MIDAZOLAM HCL 2 MG/2ML IJ SOLN
1.0000 mg | INTRAMUSCULAR | Status: DC | PRN
  Administered 2014-04-11: 2 mg via INTRAVENOUS

## 2014-04-11 MED ORDER — FENTANYL CITRATE 0.05 MG/ML IJ SOLN
INTRAMUSCULAR | Status: AC
  Filled 2014-04-11: qty 5

## 2014-04-11 MED ORDER — KETOROLAC TROMETHAMINE 30 MG/ML IJ SOLN
INTRAMUSCULAR | Status: AC
  Filled 2014-04-11: qty 1

## 2014-04-11 MED ORDER — FENTANYL CITRATE 0.05 MG/ML IJ SOLN
INTRAMUSCULAR | Status: AC
  Filled 2014-04-11: qty 2

## 2014-04-11 MED ORDER — LACTATED RINGERS IV SOLN
INTRAVENOUS | Status: DC
  Administered 2014-04-11: 07:00:00 via INTRAVENOUS

## 2014-04-11 MED ORDER — LIDOCAINE HCL 1 % IJ SOLN
INTRAMUSCULAR | Status: DC | PRN
  Administered 2014-04-11: 30 mg via INTRADERMAL

## 2014-04-11 MED ORDER — FENTANYL CITRATE 0.05 MG/ML IJ SOLN
INTRAMUSCULAR | Status: DC | PRN
  Administered 2014-04-11 (×3): 25 ug via INTRAVENOUS

## 2014-04-11 MED ORDER — POVIDONE-IODINE 10 % EX OINT
TOPICAL_OINTMENT | CUTANEOUS | Status: AC
  Filled 2014-04-11: qty 1

## 2014-04-11 MED ORDER — ARTIFICIAL TEARS OP OINT
TOPICAL_OINTMENT | OPHTHALMIC | Status: AC
  Filled 2014-04-11: qty 3.5

## 2014-04-11 SURGICAL SUPPLY — 31 items
ADH SKN CLS APL DERMABOND .7 (GAUZE/BANDAGES/DRESSINGS) ×1
BAG HAMPER (MISCELLANEOUS) ×2 IMPLANT
CLOTH BEACON ORANGE TIMEOUT ST (SAFETY) ×2 IMPLANT
COVER LIGHT HANDLE STERIS (MISCELLANEOUS) ×4 IMPLANT
DECANTER SPIKE VIAL GLASS SM (MISCELLANEOUS) ×2 IMPLANT
DERMABOND ADVANCED (GAUZE/BANDAGES/DRESSINGS) ×1
DERMABOND ADVANCED .7 DNX12 (GAUZE/BANDAGES/DRESSINGS) IMPLANT
DURAPREP 26ML APPLICATOR (WOUND CARE) ×2 IMPLANT
ELECT NDL TIP 2.8 STRL (NEEDLE) IMPLANT
ELECT NEEDLE TIP 2.8 STRL (NEEDLE) IMPLANT
ELECT REM PT RETURN 9FT ADLT (ELECTROSURGICAL) ×2
ELECTRODE REM PT RTRN 9FT ADLT (ELECTROSURGICAL) ×1 IMPLANT
FORMALIN 10 PREFIL 120ML (MISCELLANEOUS) ×2 IMPLANT
GLOVE BIO SURGEON STRL SZ 6.5 (GLOVE) ×1 IMPLANT
GLOVE BIOGEL PI IND STRL 7.0 (GLOVE) IMPLANT
GLOVE BIOGEL PI INDICATOR 7.0 (GLOVE) ×4
GLOVE SURG SS PI 7.5 STRL IVOR (GLOVE) ×2 IMPLANT
GOWN STRL REUS W/TWL LRG LVL3 (GOWN DISPOSABLE) ×7 IMPLANT
KIT ROOM TURNOVER APOR (KITS) ×2 IMPLANT
MANIFOLD NEPTUNE II (INSTRUMENTS) ×2 IMPLANT
NS IRRIG 1000ML POUR BTL (IV SOLUTION) ×2 IMPLANT
PACK MINOR (CUSTOM PROCEDURE TRAY) IMPLANT
PAD ARMBOARD 7.5X6 YLW CONV (MISCELLANEOUS) ×2 IMPLANT
SET BASIN LINEN APH (SET/KITS/TRAYS/PACK) ×2 IMPLANT
SUT ETHILON 3 0 FSL (SUTURE) IMPLANT
SUT PROLENE 4 0 PS 2 18 (SUTURE) IMPLANT
SUT VIC AB 3-0 SH 27 (SUTURE) ×4
SUT VIC AB 3-0 SH 27X BRD (SUTURE) IMPLANT
SUT VIC AB 4-0 PS2 27 (SUTURE) ×1 IMPLANT
SYRINGE CONTROL L 12CC (SYRINGE) ×2 IMPLANT
SYRINGE CONTROL LL 12CC (SYRINGE) ×1 IMPLANT

## 2014-04-11 NOTE — Transfer of Care (Signed)
Immediate Anesthesia Transfer of Care Note  Patient: [name]  Procedure(s) Performed: Procedure(s): EXCISION SOFT TISSUE NEOPLASM,  BACK (N/A)  Patient Location: PACU  Anesthesia Type:General  Level of Consciousness: awake  Airway & Oxygen Therapy: Patient Spontanous Breathing and Patient connected to face mask oxygen  Post-op Assessment: Report given to PACU RN  Post vital signs: Reviewed  Complications: No apparent anesthesia complications

## 2014-04-11 NOTE — Interval H&P Note (Signed)
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 After consideration of risks, benefits and other options for treatment, the patient has consented to  Procedure(s): EXCISION SOFT TISSUE NEOPLASM,  BACK (N/A) as a surgical intervention .  The patient's history has been reviewed, patient examined, no change in status, stable for surgery.  I have reviewed the patient's chart and labs.  Questions were answered to the patient's satisfaction.     Aviva Signs A

## 2014-04-11 NOTE — Discharge Instructions (Signed)
Lipoma  A lipoma is a noncancerous (benign) tumor composed of fat cells. They are usually found under the skin (subcutaneous). A lipoma may occur in any tissue of the body that contains fat. Common areas for lipomas to appear include the back, shoulders, buttocks, and thighs. Lipomas are a very common soft tissue growth. They are soft and grow slowly. Most problems caused by a lipoma depend on where it is growing.  DIAGNOSIS   A lipoma can be diagnosed with a physical exam. These tumors rarely become cancerous, but radiographic studies can help determine this for certain. Studies used may include:  · Computerized X-ray scans (CT or CAT scan).  · Computerized magnetic scans (MRI).  TREATMENT   Small lipomas that are not causing problems may be watched. If a lipoma continues to enlarge or causes problems, removal is often the best treatment. Lipomas can also be removed to improve appearance. Surgery is done to remove the fatty cells and the surrounding capsule. Most often, this is done with medicine that numbs the area (local anesthetic). The removed tissue is examined under a microscope to make sure it is not cancerous. Keep all follow-up appointments with your caregiver.  SEEK MEDICAL CARE IF:   · The lipoma becomes larger or hard.  · The lipoma becomes painful, red, or increasingly swollen. These could be signs of infection or a more serious condition.  Document Released: 08/05/2002 Document Revised: 11/07/2011 Document Reviewed: 01/15/2010  ExitCare® Patient Information ©2015 [employer]. This information is not intended to replace advice given to you by your health care provider. Make sure you discuss any questions you have with your health care provider.

## 2014-04-11 NOTE — Anesthesia Postprocedure Evaluation (Signed)
  Anesthesia Post-op Note  Patient: [name]  Procedure(s) Performed: Procedure(s): EXCISION SOFT TISSUE NEOPLASM,  BACK (N/A)  Patient Location: PACU  Anesthesia Type:General  Level of Consciousness: awake  Airway and Oxygen Therapy: Patient Spontanous Breathing and Patient connected to face mask oxygen  Post-op Pain: none  Post-op Assessment: Post-op Vital signs reviewed, Patient's Cardiovascular Status Stable, Respiratory Function Stable, Patent Airway and No signs of Nausea or vomiting  Post-op Vital Signs: Reviewed and stable  Last Vitals:  Filed Vitals:   04/11/14 0715  BP: 134/92  Temp:   Resp: 15    Complications: No apparent anesthesia complications

## 2014-04-11 NOTE — Anesthesia Preprocedure Evaluation (Signed)
Anesthesia Evaluation  Patient identified by MRN, date of birth, ID band Patient awake    Reviewed: Allergy & Precautions, H&P , NPO status , Patient's Chart, lab work & pertinent test results  Airway Mallampati: II TM Distance: >3 FB     Dental  (+) Teeth Intact   Pulmonary shortness of breath and with exertion,  breath sounds clear to auscultation        Cardiovascular hypertension, Pt. on medications Rhythm:Regular Rate:Normal     Neuro/Psych Paresthesia bilat LE's    GI/Hepatic GERD-  Controlled and Medicated,  Endo/Other    Renal/GU Renal InsufficiencyRenal disease     Musculoskeletal   Abdominal   Peds  Hematology   Anesthesia Other Findings   Reproductive/Obstetrics                           Anesthesia Physical Anesthesia Plan  ASA: II  Anesthesia Plan: General   Post-op Pain Management:    Induction: Intravenous  Airway Management Planned: LMA  Additional Equipment:   Intra-op Plan:   Post-operative Plan: Extubation in OR  Informed Consent: I have reviewed the patients History and Physical, chart, labs and discussed the procedure including the risks, benefits and alternatives for the proposed anesthesia with the patient or authorized representative who has indicated his/her understanding and acceptance.     Plan Discussed with:   Anesthesia Plan Comments:         Anesthesia Quick Evaluation

## 2014-04-11 NOTE — Anesthesia Procedure Notes (Signed)
Procedure Name: LMA Insertion Performed by: Ollen Bowl Pre-anesthesia Checklist: Patient identified, Patient being monitored, Emergency Drugs available, Timeout performed and Suction available Patient Re-evaluated:Patient Re-evaluated prior to inductionOxygen Delivery Method: Circle System Utilized Preoxygenation: Pre-oxygenation with 100% oxygen Intubation Type: IV induction Ventilation: Mask ventilation without difficulty LMA: LMA inserted LMA Size: 3.0 Number of attempts: 1 Placement Confirmation: positive ETCO2 and breath sounds checked- equal and bilateral

## 2014-04-11 NOTE — Op Note (Signed)
Patient:  [name]  DOB:  [birth year]  MRN:  [id number]   Preop Diagnosis:  Soft tissue mass, back  Postop Diagnosis:  Same  Procedure:  Excision of soft tissue mass, back  Surgeon:  Aviva Signs, M.D.  Anes:  General  Indications:  Patient is a 73 year old black female who presents with an enlarging tender soft tissue mass in the upper portion of her back, close to the right scapula. Risks and benefits of the procedure including bleeding, infection, and recurrence of the mass were fully explained to the patient, who gave informed consent.  Procedure note:  The patient was placed in the right lateral decubitus position after general anesthesia was administered. The upper back was prepped and draped using usual sterile technique with DuraPrep. Surgical site confirmation was performed.  A longitudinal incision was made over the greater than 4 cm ovoid subcutaneous mass. The dissection was taken down to the subcutaneous tissue. 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 It was fully excised at difficulty. Sent to pathology further examination. A bleeding was controlled using Bovie electrocautery. 0.5% Sensorcaine was instilled the surrounding wound. The subcutaneous layer was reapproximated using 3-0 Vicryl interrupted suture. The skin was closed using a 4 Vicryl subcuticular suture. Dermabond was then applied.  All tape and counts were correct at the end of the procedure. Patient is awakened and transferred to PACU in stable condition.  Complications:  None  EBL:  Minimal  Specimen:  Soft tissue mass, back

## 2014-04-14 ENCOUNTER — Encounter (HOSPITAL_COMMUNITY): Payer: Self-pay | Admitting: General Surgery

## 2014-05-13 DIAGNOSIS — M25519 Pain in unspecified shoulder: Secondary | ICD-10-CM | POA: Diagnosis not present

## 2014-05-28 ENCOUNTER — Ambulatory Visit (INDEPENDENT_AMBULATORY_CARE_PROVIDER_SITE_OTHER): Payer: Medicare Other | Admitting: Family Medicine

## 2014-05-28 ENCOUNTER — Encounter: Payer: Self-pay | Admitting: Family Medicine

## 2014-05-28 VITALS — BP 132/74 | HR 78 | Temp 97.4°F | Resp 16 | Ht 61.0 in | Wt 221.0 lb

## 2014-05-28 DIAGNOSIS — M1710 Unilateral primary osteoarthritis, unspecified knee: Secondary | ICD-10-CM | POA: Diagnosis not present

## 2014-05-28 DIAGNOSIS — E669 Obesity, unspecified: Secondary | ICD-10-CM | POA: Diagnosis not present

## 2014-05-28 DIAGNOSIS — M170 Bilateral primary osteoarthritis of knee: Secondary | ICD-10-CM

## 2014-05-28 DIAGNOSIS — I10 Essential (primary) hypertension: Secondary | ICD-10-CM | POA: Diagnosis not present

## 2014-05-28 NOTE — Patient Instructions (Signed)
Handicap sticker given  Continue current medications F/U 6 months

## 2014-05-28 NOTE — Progress Notes (Signed)
Patient ID: [name], female   DOB: 1940/08/30, 72 y.o.   MRN: [id number]   Subjective:    Patient ID: [name], female    DOB: November 17, 1940, 73 y.o.   MRN: [id number]  Patient presents for 6 month F/U  patient here to follow chronic medical problems. She has no specific concerns. She's taken her blood pressure medicine as prescribed. Regarding her arthritis she uses her pain medicine and Voltaren gel as needed. She will get flu shot on her job.    Review Of Systems:  GEN- denies fatigue, fever, weight loss,weakness, recent illness HEENT- denies eye drainage, change in vision, nasal discharge, CVS- denies chest pain, palpitations RESP- denies SOB, cough, wheeze ABD- denies N/V, change in stools, abd pain GU- denies dysuria, hematuria, dribbling, incontinence MSK- + joint pain, muscle aches, injury Neuro- denies headache, dizziness, syncope, seizure activity       Objective:    BP 132/74  Pulse 78  Temp(Src) 97.4 F (36.3 C) (Oral)  Resp 16  Ht 5\' 1"  (1.549 m)  Wt 221 lb (100.245 kg)  BMI 41.78 kg/m2 GEN- NAD, alert and oriented x3 HEENT- PERRL, EOMI, non injected sclera, pink conjunctiva, MMM, oropharynx clear CVS- RRR, no murmur RESP-CTAB EXT- No edema Pulses- Radial, DP- 2+        Assessment & Plan:      Problem List Items Addressed This Visit   None      Note: This dictation was prepared with Dragon dictation along with smaller phrase technology. Any transcriptional errors that result from this process are unintentional.

## 2014-05-29 ENCOUNTER — Encounter: Payer: Self-pay | Admitting: Family Medicine

## 2014-05-29 NOTE — Assessment & Plan Note (Signed)
Doing well with current regimen as needed

## 2014-05-29 NOTE — Assessment & Plan Note (Signed)
Well controlled, no change to meds 

## 2014-05-29 NOTE — Assessment & Plan Note (Signed)
Weight unchanged

## 2014-09-30 DIAGNOSIS — M109 Gout, unspecified: Secondary | ICD-10-CM | POA: Diagnosis not present

## 2014-09-30 DIAGNOSIS — M25579 Pain in unspecified ankle and joints of unspecified foot: Secondary | ICD-10-CM | POA: Diagnosis not present

## 2014-09-30 DIAGNOSIS — M79671 Pain in right foot: Secondary | ICD-10-CM | POA: Diagnosis not present

## 2014-10-28 ENCOUNTER — Ambulatory Visit (INDEPENDENT_AMBULATORY_CARE_PROVIDER_SITE_OTHER): Payer: Medicare Other | Admitting: Family Medicine

## 2014-10-28 ENCOUNTER — Encounter: Payer: Self-pay | Admitting: Family Medicine

## 2014-10-28 VITALS — BP 148/84 | HR 80 | Temp 97.9°F | Resp 18 | Ht 61.0 in | Wt 216.0 lb

## 2014-10-28 DIAGNOSIS — J0100 Acute maxillary sinusitis, unspecified: Secondary | ICD-10-CM

## 2014-10-28 DIAGNOSIS — M25579 Pain in unspecified ankle and joints of unspecified foot: Secondary | ICD-10-CM | POA: Diagnosis not present

## 2014-10-28 DIAGNOSIS — M79671 Pain in right foot: Secondary | ICD-10-CM | POA: Diagnosis not present

## 2014-10-28 DIAGNOSIS — J988 Other specified respiratory disorders: Secondary | ICD-10-CM | POA: Diagnosis not present

## 2014-10-28 DIAGNOSIS — M10071 Idiopathic gout, right ankle and foot: Secondary | ICD-10-CM | POA: Diagnosis not present

## 2014-10-28 MED ORDER — ALBUTEROL SULFATE HFA 108 (90 BASE) MCG/ACT IN AERS: 2.0000 | INHALATION_SPRAY | Freq: Four times a day (QID) | RESPIRATORY_TRACT | Status: DC | PRN

## 2014-10-28 MED ORDER — AZITHROMYCIN 250 MG PO TABS: ORAL_TABLET | ORAL | Status: DC

## 2014-10-28 NOTE — Patient Instructions (Signed)
Use the inhaler as needed for wheezing Take antibiotics Continue cough medicine Use nasal saline rinse in your nose  Humidifier  F/U as previous

## 2014-10-28 NOTE — Progress Notes (Signed)
Patient ID: [name], female   DOB: 08-04-1941, 74 y.o.   MRN: [id number]   Subjective:    Patient ID: [name], female    DOB: 25-May-1941, 74 y.o.   MRN: [id number]  Patient presents for Illness  patient here with sinus pressure and drainage worsening over the past week. 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 She's also had some cough with production and wheezing worse at nighttime. She is using Coricidin with minimal improvement. She has not had any fever but she is concerned that she is not improving. She denies any chest pain or shortness of breath but has some tightness with the wheezing.    Review Of Systems:  GEN- denies fatigue, fever, weight loss,weakness, recent illness HEENT- denies eye drainage, change in vision,+ nasal discharge, CVS- denies chest pain, palpitations RESP- denies SOB, +cough, +wheeze ABD- denies N/V, change in stools, abd pain GU- denies dysuria, hematuria, dribbling, incontinence MSK- denies joint pain, muscle aches, injury Neuro- denies headache, dizziness, syncope, seizure activity       Objective:    BP 148/84 mmHg  Pulse 80  Temp(Src) 97.9 F (36.6 C) (Oral)  Resp 18  Ht 5\' 1"  (1.549 m)  Wt 216 lb (97.977 kg)  BMI 40.83 kg/m2 GEN- NAD, alert and oriented x3 HEENT- PERRL, EOMI, non injected sclera, pink conjunctiva, MMM, oropharynx mild injection, TM clear bilat no effusion,  + maxillary sinus tenderness, inflammed turbinates,  Nasal drainage  Neck- Supple, no LAD CVS- RRR, no murmur RESP-few wheeze on expiration, no rales, no rhonchi, normal WOB EXT- No edema Pulses- Radial 2+         Assessment & Plan:      Problem List Items Addressed This Visit    None    Visit Diagnoses    Acute maxillary sinusitis, recurrence not specified    -  Primary    Treat for sinusits with zpak, nasal saline rinse, continue coricidan    Relevant Medications    azithromycin (ZITHROMAX) tablet    Respiratory infection        I  think the post nasal drip may be causing the irritation, but she is high risk for bronchitis/pNA based on age and sick contacts, given albuterol inhaler for wheeze, prednisone if no improvement    Relevant Medications    azithromycin (ZITHROMAX) tablet       Note: This dictation was prepared with Dragon dictation along with smaller phrase technology. Any transcriptional errors that result from this process are unintentional.

## 2014-11-26 ENCOUNTER — Encounter: Payer: Self-pay | Admitting: Family Medicine

## 2014-11-26 ENCOUNTER — Ambulatory Visit (INDEPENDENT_AMBULATORY_CARE_PROVIDER_SITE_OTHER): Payer: Medicare Other | Admitting: Family Medicine

## 2014-11-26 VITALS — BP 136/80 | HR 82 | Temp 97.5°F | Resp 16 | Ht 61.0 in | Wt 218.0 lb

## 2014-11-26 DIAGNOSIS — E669 Obesity, unspecified: Secondary | ICD-10-CM

## 2014-11-26 DIAGNOSIS — E785 Hyperlipidemia, unspecified: Secondary | ICD-10-CM

## 2014-11-26 DIAGNOSIS — K219 Gastro-esophageal reflux disease without esophagitis: Secondary | ICD-10-CM | POA: Diagnosis not present

## 2014-11-26 DIAGNOSIS — Z23 Encounter for immunization: Secondary | ICD-10-CM | POA: Diagnosis not present

## 2014-11-26 DIAGNOSIS — I10 Essential (primary) hypertension: Secondary | ICD-10-CM | POA: Diagnosis not present

## 2014-11-26 DIAGNOSIS — E559 Vitamin D deficiency, unspecified: Secondary | ICD-10-CM | POA: Diagnosis not present

## 2014-11-26 MED ORDER — ALLOPURINOL 100 MG PO TABS: 100.0000 mg | ORAL_TABLET | Freq: Every day | ORAL | Status: DC

## 2014-11-26 NOTE — Patient Instructions (Addendum)
Continue current medication We will call wit hlab results Prevnar 13 given F/U 6 months- PHYSICAL

## 2014-11-26 NOTE — Assessment & Plan Note (Signed)
blood pressure well controlled

## 2014-11-26 NOTE — Assessment & Plan Note (Signed)
Acid reflux controlled with PPI

## 2014-11-26 NOTE — Assessment & Plan Note (Signed)
Recheck vitamin D level she is on maintenance therapy Prevnar 13 vaccine given

## 2014-11-26 NOTE — Progress Notes (Signed)
Patient ID: [name], female   DOB: October 26, 1940, 74 y.o.   MRN: [id number]   Subjective:    Patient ID: [name], female    DOB: 1941-08-21, 74 y.o.   MRN: [id number]  Patient presents for 6 month F/U  patient here to follow chronic medical problems. She is no specific concerns today. She was seen by her podiatrist after she had an acute gout attack in her right foot. He gave her what sounds like indomethacin and now she is on allopurinol 100 mg once a day. She's not had any problems since then. Her previous respiratory infection has also cleared up. She is due for fasting labs today    Review Of Systems:  GEN- denies fatigue, fever, weight loss,weakness, recent illness HEENT- denies eye drainage, change in vision, nasal discharge, CVS- denies chest pain, palpitations RESP- denies SOB, cough, wheeze ABD- denies N/V, change in stools, abd pain GU- denies dysuria, hematuria, dribbling, incontinence MSK- + joint pain, muscle aches, injury Neuro- denies headache, dizziness, syncope, seizure activity       Objective:    BP 136/80 mmHg  Pulse 82  Temp(Src) 97.5 F (36.4 C) (Oral)  Resp 16  Ht 5\' 1"  (1.549 m)  Wt 218 lb (98.884 kg)  BMI 41.21 kg/m2 GEN- NAD, alert and oriented x3 HEENT- PERRL, EOMI, non injected sclera, pink conjunctiva, MMM, oropharynx clear Neck- Supple, no thyromegaly CVS- RRR, no murmur RESP-CTAB EXT- No edema Pulses- Radial  2+        Assessment & Plan:      Problem List Items Addressed This Visit      Unprioritized   Vitamin D deficiency   Relevant Orders   Vitamin D, 25-hydroxy   Obesity   Mild hyperlipidemia   Relevant Orders   Lipid panel   GERD (gastroesophageal reflux disease)   Essential hypertension, benign - Primary   Relevant Orders   CBC with Differential/Platelet   Comprehensive metabolic panel    Other Visit Diagnoses    Need for prophylactic vaccination against Streptococcus pneumoniae (pneumococcus)         Relevant Orders    Pneumococcal conjugate vaccine 13-valent       Note: This dictation was prepared with Dragon dictation along with smaller phrase technology. Any transcriptional errors that result from this process are unintentional.

## 2014-11-26 NOTE — Assessment & Plan Note (Signed)
Recheck labs today. 

## 2014-11-27 LAB — LIPID PANEL
CHOL/HDL RATIO: 2.5 ratio
Cholesterol: 173 mg/dL (ref 0–200)
HDL: 70 mg/dL (ref >=46)
LDL Cholesterol: 81 mg/dL (ref 0–99)
Triglycerides: 110 mg/dL (ref <150)
VLDL: 22 mg/dL (ref 0–40)

## 2014-11-27 LAB — CBC WITH DIFFERENTIAL/PLATELET
BASOS PCT: 0 % (ref 0–1)
Basophils Absolute: 0 10*3/uL (ref 0.0–0.1)
Eosinophils Absolute: 0.1 10*3/uL (ref 0.0–0.7)
Eosinophils Relative: 1 % (ref 0–5)
HCT: 37.5 % (ref 36.0–46.0)
Hemoglobin: 12.2 g/dL (ref 12.0–15.0)
LYMPHS ABS: 2.7 10*3/uL (ref 0.7–4.0)
LYMPHS PCT: 45 % (ref 12–46)
MCH: 32.4 pg (ref 26.0–34.0)
MCHC: 32.5 g/dL (ref 30.0–36.0)
MCV: 99.5 fL (ref 78.0–100.0)
MPV: 9.8 fL (ref 8.6–12.4)
Monocytes Absolute: 0.3 10*3/uL (ref 0.1–1.0)
Monocytes Relative: 5 % (ref 3–12)
NEUTROS PCT: 49 % (ref 43–77)
Neutro Abs: 3 10*3/uL (ref 1.7–7.7)
PLATELETS: 336 10*3/uL (ref 150–400)
RBC: 3.77 MIL/uL — ABNORMAL LOW (ref 3.87–5.11)
RDW: 12.2 % (ref 11.5–15.5)
WBC: 6.1 10*3/uL (ref 4.0–10.5)

## 2014-11-27 LAB — COMPREHENSIVE METABOLIC PANEL
ALK PHOS: 65 U/L (ref 39–117)
ALT: 12 U/L (ref 0–35)
AST: 35 U/L (ref 0–37)
Albumin: 3.9 g/dL (ref 3.5–5.2)
BILIRUBIN TOTAL: 0.4 mg/dL (ref 0.2–1.2)
BUN: 14 mg/dL (ref 6–23)
CO2: 28 meq/L (ref 19–32)
CREATININE: 0.97 mg/dL (ref 0.50–1.10)
Calcium: 10 mg/dL (ref 8.4–10.5)
Chloride: 104 mEq/L (ref 96–112)
Glucose, Bld: 77 mg/dL (ref 70–99)
Potassium: 4 mEq/L (ref 3.5–5.3)
SODIUM: 141 meq/L (ref 135–145)
Total Protein: 7.6 g/dL (ref 6.0–8.3)

## 2014-11-27 LAB — VITAMIN D 25 HYDROXY (VIT D DEFICIENCY, FRACTURES): Vit D, 25-Hydroxy: 19 ng/mL — ABNORMAL LOW (ref 30–100)

## 2014-11-28 ENCOUNTER — Other Ambulatory Visit: Payer: Self-pay | Admitting: *Deleted

## 2014-11-28 MED ORDER — ERGOCALCIFEROL 1.25 MG (50000 UT) PO CAPS: ORAL_CAPSULE | ORAL | Status: DC

## 2014-12-08 DIAGNOSIS — Z6841 Body Mass Index (BMI) 40.0 and over, adult: Secondary | ICD-10-CM | POA: Diagnosis not present

## 2014-12-08 DIAGNOSIS — M109 Gout, unspecified: Secondary | ICD-10-CM | POA: Diagnosis not present

## 2014-12-08 DIAGNOSIS — M25531 Pain in right wrist: Secondary | ICD-10-CM | POA: Diagnosis not present

## 2014-12-08 DIAGNOSIS — M79641 Pain in right hand: Secondary | ICD-10-CM | POA: Diagnosis not present

## 2014-12-19 DIAGNOSIS — M79641 Pain in right hand: Secondary | ICD-10-CM | POA: Diagnosis not present

## 2014-12-19 DIAGNOSIS — M109 Gout, unspecified: Secondary | ICD-10-CM | POA: Diagnosis not present

## 2014-12-19 DIAGNOSIS — Z6841 Body Mass Index (BMI) 40.0 and over, adult: Secondary | ICD-10-CM | POA: Diagnosis not present

## 2014-12-23 DIAGNOSIS — M10071 Idiopathic gout, right ankle and foot: Secondary | ICD-10-CM | POA: Diagnosis not present

## 2014-12-23 DIAGNOSIS — M79671 Pain in right foot: Secondary | ICD-10-CM | POA: Diagnosis not present

## 2015-01-19 ENCOUNTER — Other Ambulatory Visit: Payer: Self-pay | Admitting: *Deleted

## 2015-01-19 MED ORDER — TRIAMTERENE-HCTZ 37.5-25 MG PO TABS: ORAL_TABLET | ORAL | Status: DC

## 2015-01-19 NOTE — Telephone Encounter (Signed)
Received fax requesting refill on Maxzide.   Refill appropriate and filled per protocol.

## 2015-02-17 DIAGNOSIS — M109 Gout, unspecified: Secondary | ICD-10-CM | POA: Diagnosis not present

## 2015-02-17 DIAGNOSIS — Z6841 Body Mass Index (BMI) 40.0 and over, adult: Secondary | ICD-10-CM | POA: Diagnosis not present

## 2015-02-17 DIAGNOSIS — M25561 Pain in right knee: Secondary | ICD-10-CM | POA: Diagnosis not present

## 2015-02-17 DIAGNOSIS — Z8719 Personal history of other diseases of the digestive system: Secondary | ICD-10-CM | POA: Diagnosis not present

## 2015-03-11 DIAGNOSIS — Z8719 Personal history of other diseases of the digestive system: Secondary | ICD-10-CM | POA: Diagnosis not present

## 2015-03-11 DIAGNOSIS — Z6841 Body Mass Index (BMI) 40.0 and over, adult: Secondary | ICD-10-CM | POA: Diagnosis not present

## 2015-03-11 DIAGNOSIS — M25561 Pain in right knee: Secondary | ICD-10-CM | POA: Diagnosis not present

## 2015-03-31 ENCOUNTER — Telehealth: Payer: Self-pay

## 2015-03-31 NOTE — Telephone Encounter (Signed)
Letter mailed to pt.  

## 2015-03-31 NOTE — Telephone Encounter (Signed)
September RECALL FOR TCS °

## 2015-04-07 DIAGNOSIS — M79672 Pain in left foot: Secondary | ICD-10-CM | POA: Diagnosis not present

## 2015-04-07 DIAGNOSIS — M10071 Idiopathic gout, right ankle and foot: Secondary | ICD-10-CM | POA: Diagnosis not present

## 2015-04-07 DIAGNOSIS — B351 Tinea unguium: Secondary | ICD-10-CM | POA: Diagnosis not present

## 2015-04-07 DIAGNOSIS — M10072 Idiopathic gout, left ankle and foot: Secondary | ICD-10-CM | POA: Diagnosis not present

## 2015-05-13 DIAGNOSIS — M25561 Pain in right knee: Secondary | ICD-10-CM | POA: Diagnosis not present

## 2015-05-13 DIAGNOSIS — Z8719 Personal history of other diseases of the digestive system: Secondary | ICD-10-CM | POA: Diagnosis not present

## 2015-05-13 DIAGNOSIS — Z6841 Body Mass Index (BMI) 40.0 and over, adult: Secondary | ICD-10-CM | POA: Diagnosis not present

## 2015-05-25 ENCOUNTER — Other Ambulatory Visit: Payer: Medicare Other

## 2015-05-25 DIAGNOSIS — N289 Disorder of kidney and ureter, unspecified: Secondary | ICD-10-CM

## 2015-05-25 DIAGNOSIS — E785 Hyperlipidemia, unspecified: Secondary | ICD-10-CM

## 2015-05-25 DIAGNOSIS — E669 Obesity, unspecified: Secondary | ICD-10-CM | POA: Diagnosis not present

## 2015-05-25 DIAGNOSIS — I10 Essential (primary) hypertension: Secondary | ICD-10-CM | POA: Diagnosis not present

## 2015-05-25 DIAGNOSIS — Z0000 Encounter for general adult medical examination without abnormal findings: Secondary | ICD-10-CM

## 2015-05-25 DIAGNOSIS — E559 Vitamin D deficiency, unspecified: Secondary | ICD-10-CM | POA: Diagnosis not present

## 2015-05-25 DIAGNOSIS — Z79899 Other long term (current) drug therapy: Secondary | ICD-10-CM | POA: Diagnosis not present

## 2015-05-25 LAB — CBC WITH DIFFERENTIAL/PLATELET
Basophils Absolute: 0 10*3/uL (ref 0.0–0.1)
Basophils Relative: 0 % (ref 0–1)
Eosinophils Absolute: 0.1 10*3/uL (ref 0.0–0.7)
Eosinophils Relative: 2 % (ref 0–5)
HCT: 40.3 % (ref 36.0–46.0)
Hemoglobin: 13.5 g/dL (ref 12.0–15.0)
LYMPHS ABS: 1.8 10*3/uL (ref 0.7–4.0)
Lymphocytes Relative: 41 % (ref 12–46)
MCH: 33.1 pg (ref 26.0–34.0)
MCHC: 33.5 g/dL (ref 30.0–36.0)
MCV: 98.8 fL (ref 78.0–100.0)
MONOS PCT: 9 % (ref 3–12)
MPV: 9.8 fL (ref 8.6–12.4)
Monocytes Absolute: 0.4 10*3/uL (ref 0.1–1.0)
NEUTROS PCT: 48 % (ref 43–77)
Neutro Abs: 2.2 10*3/uL (ref 1.7–7.7)
Platelets: 287 10*3/uL (ref 150–400)
RBC: 4.08 MIL/uL (ref 3.87–5.11)
RDW: 12.7 % (ref 11.5–15.5)
WBC: 4.5 10*3/uL (ref 4.0–10.5)

## 2015-05-25 LAB — COMPLETE METABOLIC PANEL WITH GFR
ALT: 18 U/L (ref 6–29)
AST: 41 U/L — ABNORMAL HIGH (ref 10–35)
Albumin: 3.9 g/dL (ref 3.6–5.1)
Alkaline Phosphatase: 67 U/L (ref 33–130)
BUN: 22 mg/dL (ref 7–25)
CHLORIDE: 104 mmol/L (ref 98–110)
CO2: 27 mmol/L (ref 20–31)
Calcium: 10 mg/dL (ref 8.6–10.4)
Creat: 0.98 mg/dL — ABNORMAL HIGH (ref 0.60–0.93)
GFR, EST AFRICAN AMERICAN: 67 mL/min (ref >=60)
GFR, EST NON AFRICAN AMERICAN: 58 mL/min — AB (ref >=60)
Glucose, Bld: 94 mg/dL (ref 70–99)
POTASSIUM: 4.1 mmol/L (ref 3.5–5.3)
Sodium: 139 mmol/L (ref 135–146)
Total Bilirubin: 0.5 mg/dL (ref 0.2–1.2)
Total Protein: 7.2 g/dL (ref 6.1–8.1)

## 2015-05-25 LAB — LIPID PANEL
CHOLESTEROL: 177 mg/dL (ref 125–200)
HDL: 73 mg/dL (ref >=46)
LDL Cholesterol: 89 mg/dL (ref <130)
TRIGLYCERIDES: 73 mg/dL (ref <150)
Total CHOL/HDL Ratio: 2.4 Ratio (ref <=5.0)
VLDL: 15 mg/dL (ref <30)

## 2015-05-26 LAB — TSH: TSH: 1.045 u[IU]/mL (ref 0.350–4.500)

## 2015-05-26 LAB — VITAMIN D 25 HYDROXY (VIT D DEFICIENCY, FRACTURES): Vit D, 25-Hydroxy: 37 ng/mL (ref 30–100)

## 2015-06-01 ENCOUNTER — Encounter: Payer: Self-pay | Admitting: Family Medicine

## 2015-06-01 ENCOUNTER — Ambulatory Visit (INDEPENDENT_AMBULATORY_CARE_PROVIDER_SITE_OTHER): Payer: Medicare Other | Admitting: Family Medicine

## 2015-06-01 VITALS — BP 138/78 | HR 82 | Temp 98.0°F | Resp 16 | Ht 61.0 in | Wt 225.0 lb

## 2015-06-01 DIAGNOSIS — R7989 Other specified abnormal findings of blood chemistry: Secondary | ICD-10-CM

## 2015-06-01 DIAGNOSIS — Z23 Encounter for immunization: Secondary | ICD-10-CM | POA: Diagnosis not present

## 2015-06-01 DIAGNOSIS — E785 Hyperlipidemia, unspecified: Secondary | ICD-10-CM | POA: Diagnosis not present

## 2015-06-01 DIAGNOSIS — Z0000 Encounter for general adult medical examination without abnormal findings: Secondary | ICD-10-CM

## 2015-06-01 DIAGNOSIS — E669 Obesity, unspecified: Secondary | ICD-10-CM | POA: Diagnosis not present

## 2015-06-01 DIAGNOSIS — R945 Abnormal results of liver function studies: Secondary | ICD-10-CM

## 2015-06-01 DIAGNOSIS — E559 Vitamin D deficiency, unspecified: Secondary | ICD-10-CM | POA: Diagnosis not present

## 2015-06-01 DIAGNOSIS — I10 Essential (primary) hypertension: Secondary | ICD-10-CM

## 2015-06-01 NOTE — Patient Instructions (Signed)
Continue current medications Recheck liver function- LAB ONLY 4 WEEKS Flu shot given  F/U 6 months

## 2015-06-01 NOTE — Assessment & Plan Note (Signed)
Continue Vitamin D Fasting labs reviewed, mild elevation in ALT, on Lamisil, recheck in 4 weeks

## 2015-06-01 NOTE — Assessment & Plan Note (Signed)
Well controlled, no change to meds 

## 2015-06-01 NOTE — Progress Notes (Signed)
Patient ID: [name], female   DOB: 1940-11-26, 74 y.o.   MRN: [id number] Subjective:   Patient presents for Medicare Annual/Subsequent preventive examination.   Pt here for wellness exam. No concerns Seen by podiatry a few months ago, had gout flares, on allopurinol, also had flare in her knee treated by othopedics- Dr. Faylene Million. She had nail fungus and was started on Lamisil about 4 weeks ago.  Medications reviewed Review Past Medical/Family/Social: Per EMR    Risk Factors  Current exercise habits: Walks Dietary issues discussed: Yes, HTN  Cardiac risk factors: Obesity (BMI >= 30 kg/m2).   Depression Screen  (Note: if answer to either of the following is "Yes", a more complete depression screening is indicated)  Over the past two weeks, have you felt down, depressed or hopeless? No Over the past two weeks, have you felt little interest or pleasure in doing things? No Have you lost interest or pleasure in daily life? No Do you often feel hopeless? No Do you cry easily over simple problems? No   Activities of Daily Living  In your present state of health, do you have any difficulty performing the following activities?:  Driving? No  Managing money? No  Feeding yourself? No  Getting from bed to chair? No  Climbing a flight of stairs? No  Preparing food and eating?: No  Bathing or showering? No  Getting dressed: No  Getting to the toilet? No  Using the toilet:No  Moving around from place to place: No  In the past year have you fallen or had a near fall?:No  Are you sexually active? No  Do you have more than one partner? No   Hearing Difficulties: No  Do you often ask people to speak up or repeat themselves? No  Do you experience ringing or noises in your ears? No Do you have difficulty understanding soft or whispered voices? No  Do you feel that you have a problem with memory? No Do you often misplace items? No  Do you feel safe at home? Yes  Cognitive Testing   Alert? Yes Normal Appearance?Yes  Oriented to person? Yes Place? Yes  Time? Yes  Recall of three objects? Yes  Can perform simple calculations? Yes  Displays appropriate judgment?Yes  Can read the correct time from a watch face?Yes   List the Names of Other Physician/Practitioners you currently use:  Dr. Luna Glasgow, Dr. Berline Lopes    Screening Tests / Date Colonoscopy    - Due In Sept 9                 Zostavax - Pt Declined  Mammogram - Declines  Influenza Vaccine -  Due  Tetanus/tdap - Pt declines   ROS: GEN- denies fatigue, fever, weight loss,weakness, recent illness HEENT- denies eye drainage, change in vision, nasal discharge, CVS- denies chest pain, palpitations RESP- denies SOB, cough, wheeze ABD- denies N/V, change in stools, abd pain GU- denies dysuria, hematuria, dribbling, incontinence MSK- + joint pain, muscle aches, injury Neuro- denies headache, dizziness, syncope, seizure activity   PHYSICAL: GEN- NAD, alert and oriented x3 HEENT- PERRL, EOMI, non injected sclera, pink conjunctiva, MMM, oropharynx clear Neck- Supple, no thryomegaly, no bruit  CVS- RRR, no murmur RESP-CTAB EXT- No edema Pulses- Radial, DP- 2+   Assessment:    Annual wellness medicare exam   Plan:    During the course of the visit the patient was educated and counseled about appropriate screening and preventive services including:  Screening mammography  Flu shot  given .  Screen Neg for depression.   Diet review for nutrition referral? Yes ____ Not Indicated __x__  Dietary changes discussed, avoid frying foods, decrease carbs, eating a lot of bread and potatoes, switch to whole grains Patient Instructions (the written plan) was given to the patient.  Medicare Attestation  I have personally reviewed:  The patient's medical and social history  Their use of alcohol, tobacco or illicit drugs  Their current medications and supplements  The patient's functional ability including  ADLs,fall risks, home safety risks, cognitive, and hearing and visual impairment  Diet and physical activities  Evidence for depression or mood disorders  The patient's weight, height, BMI, and visual acuity have been recorded in the chart. I have made referrals, counseling, and provided education to the patient based on review of the above and I have provided the patient with a written personalized care plan for preventive services.

## 2015-06-02 DIAGNOSIS — M10071 Idiopathic gout, right ankle and foot: Secondary | ICD-10-CM | POA: Diagnosis not present

## 2015-06-02 DIAGNOSIS — M79674 Pain in right toe(s): Secondary | ICD-10-CM | POA: Diagnosis not present

## 2015-06-04 ENCOUNTER — Other Ambulatory Visit: Payer: Self-pay | Admitting: Family Medicine

## 2015-06-04 NOTE — Telephone Encounter (Signed)
Refill appropriate and filled per protocol. 

## 2015-06-29 ENCOUNTER — Other Ambulatory Visit: Payer: Medicare Other

## 2015-06-29 DIAGNOSIS — R945 Abnormal results of liver function studies: Secondary | ICD-10-CM

## 2015-06-29 DIAGNOSIS — R7989 Other specified abnormal findings of blood chemistry: Secondary | ICD-10-CM

## 2015-06-29 DIAGNOSIS — M79673 Pain in unspecified foot: Secondary | ICD-10-CM

## 2015-06-29 DIAGNOSIS — R799 Abnormal finding of blood chemistry, unspecified: Secondary | ICD-10-CM | POA: Diagnosis not present

## 2015-06-29 DIAGNOSIS — M79609 Pain in unspecified limb: Secondary | ICD-10-CM | POA: Diagnosis not present

## 2015-06-29 LAB — COMPREHENSIVE METABOLIC PANEL
ALBUMIN: 3.9 g/dL (ref 3.6–5.1)
ALK PHOS: 73 U/L (ref 33–130)
ALT: 16 U/L (ref 6–29)
AST: 35 U/L (ref 10–35)
BILIRUBIN TOTAL: 0.5 mg/dL (ref 0.2–1.2)
BUN: 25 mg/dL (ref 7–25)
CALCIUM: 10 mg/dL (ref 8.6–10.4)
CO2: 26 mmol/L (ref 20–31)
Chloride: 102 mmol/L (ref 98–110)
Creat: 1.13 mg/dL — ABNORMAL HIGH (ref 0.60–0.93)
Glucose, Bld: 93 mg/dL (ref 70–99)
POTASSIUM: 4.5 mmol/L (ref 3.5–5.3)
Sodium: 138 mmol/L (ref 135–146)
TOTAL PROTEIN: 7.5 g/dL (ref 6.1–8.1)

## 2015-06-29 LAB — URIC ACID: URIC ACID, SERUM: 7.2 mg/dL — AB (ref 2.4–7.0)

## 2015-09-10 ENCOUNTER — Ambulatory Visit (INDEPENDENT_AMBULATORY_CARE_PROVIDER_SITE_OTHER): Payer: Medicare Other | Admitting: Physician Assistant

## 2015-09-10 ENCOUNTER — Encounter: Payer: Self-pay | Admitting: Physician Assistant

## 2015-09-10 VITALS — BP 124/84 | HR 80 | Temp 98.3°F | Resp 18 | Wt 221.0 lb

## 2015-09-10 DIAGNOSIS — J988 Other specified respiratory disorders: Secondary | ICD-10-CM | POA: Diagnosis not present

## 2015-09-10 DIAGNOSIS — B9689 Other specified bacterial agents as the cause of diseases classified elsewhere: Principal | ICD-10-CM

## 2015-09-10 MED ORDER — AZITHROMYCIN 250 MG PO TABS: ORAL_TABLET | ORAL | Status: DC

## 2015-09-10 NOTE — Progress Notes (Signed)
Patient ID: [name] MRN: [id number], DOB: 1941/08/13, 75 y.o. Date of Encounter: 09/10/2015, 1:37 PM    Chief Complaint:  Chief Complaint  Patient presents with  . URI     HPI: 75 y.o. year old female says that she has been sick for more than one week. Says that she is feeling congestion and pressure in her sinuses and having a headache. Also feels stopped up and congested. Says that she has episodes of cough and coughs up thick phlegm. Sometimes is able to blow thick mucus from her nose. Has had no significant sore throat. No fevers or chills.     Home Meds:   Outpatient Prescriptions Prior to Visit  Medication Sig Dispense Refill  . albuterol (PROVENTIL HFA;VENTOLIN HFA) 108 (90 BASE) MCG/ACT inhaler Inhale 2 puffs into the lungs every 6 (six) hours as needed for wheezing or shortness of breath. 1 Inhaler 0  . allopurinol (ZYLOPRIM) 100 MG tablet Take 1 tablet (100 mg total) by mouth daily. 30 tablet 6  . aspirin EC 81 MG tablet Take 81 mg by mouth daily.      . Calcium Carbonate-Vitamin D (CALCIUM-D) 600-400 MG-UNIT TABS Take by mouth 2 (two) times daily.    . diclofenac sodium (VOLTAREN) 1 % GEL Apply 2 g topically 4 (four) times daily. Pain 100 g 3  . ergocalciferol (VITAMIN D2) 50000 UNITS capsule Take (1) cap PO q week x12 weeks, then D/C. 12 capsule 0  . ibuprofen (ADVIL,MOTRIN) 200 MG tablet Take 400 mg by mouth every 6 (six) hours as needed. Pain    . naproxen (NAPROSYN) 500 MG tablet Take 500 mg by mouth 2 (two) times daily as needed.    . pantoprazole (PROTONIX) 40 MG tablet take 1 tablet by mouth once daily 90 tablet 1  . terbinafine (LAMISIL) 250 MG tablet Take 250 mg by mouth daily.    Marland Kitchen triamterene-hydrochlorothiazide (MAXZIDE-25) 37.5-25 MG per tablet take 1 tablet by mouth once daily 90 tablet 3   No facility-administered medications prior to visit.    Allergies: No Known Allergies    Review of Systems: See HPI for pertinent ROS. All other ROS  negative.    Physical Exam: Blood pressure 124/84, pulse 80, temperature 98.3 F (36.8 C), temperature source Oral, resp. rate 18, weight 221 lb (100.245 kg)., Body mass index is 41.78 kg/(m^2). General:  WNWD Female. Appears in no acute distress. HEENT: Normocephalic, atraumatic, eyes without discharge, sclera non-icteric, nares are without discharge. Bilateral auditory canals clear, TM's are without perforation, pearly grey and translucent with reflective cone of light bilaterally. Oral cavity moist, posterior pharynx without exudate, erythema, peritonsillar abscess. No tenderness with percussion of frontal and maxillary sinuses bilaterally.  Neck: Supple. No thyromegaly. No lymphadenopathy. Lungs: Clear bilaterally to auscultation without wheezes, rales, or rhonchi. Breathing is unlabored. Heart: Regular rhythm. No murmurs, rubs, or gallops. Msk:  Strength and tone normal for age. Extremities/Skin: Warm and dry. Neuro: Alert and oriented X 3. Moves all extremities spontaneously. Gait is normal. CNII-XII grossly in tact. Psych:  Responds to questions appropriately with a normal affect.     ASSESSMENT AND PLAN:  75 y.o. year old female with  1. Bacterial respiratory infection She is to take antibiotic as directed. Follow-up if symptoms do not resolve within 1 week after completion of antibiotic. - azithromycin (ZITHROMAX) 250 MG tablet; Day 1: Take 2 daily. Days 2-5: Take 1 daily.  Dispense: 6 tablet; Refill: 0   Signed, Karis Juba,  PA, BSFM 09/10/2015 1:37 PM

## 2015-11-30 ENCOUNTER — Ambulatory Visit (INDEPENDENT_AMBULATORY_CARE_PROVIDER_SITE_OTHER): Payer: Medicare Other | Admitting: Family Medicine

## 2015-11-30 ENCOUNTER — Encounter: Payer: Self-pay | Admitting: Family Medicine

## 2015-11-30 VITALS — BP 128/78 | HR 66 | Temp 97.8°F | Resp 18 | Ht 61.0 in | Wt 216.0 lb

## 2015-11-30 DIAGNOSIS — I10 Essential (primary) hypertension: Secondary | ICD-10-CM

## 2015-11-30 DIAGNOSIS — K219 Gastro-esophageal reflux disease without esophagitis: Secondary | ICD-10-CM

## 2015-11-30 DIAGNOSIS — J302 Other seasonal allergic rhinitis: Secondary | ICD-10-CM

## 2015-11-30 DIAGNOSIS — E669 Obesity, unspecified: Secondary | ICD-10-CM

## 2015-11-30 DIAGNOSIS — J0100 Acute maxillary sinusitis, unspecified: Secondary | ICD-10-CM

## 2015-11-30 MED ORDER — FLUTICASONE PROPIONATE 50 MCG/ACT NA SUSP: 2.0000 | Freq: Every day | NASAL | Status: DC

## 2015-11-30 MED ORDER — AMOXICILLIN 875 MG PO TABS: 875.0000 mg | ORAL_TABLET | Freq: Two times a day (BID) | ORAL | Status: DC

## 2015-11-30 MED ORDER — LORATADINE 10 MG PO TABS: 10.0000 mg | ORAL_TABLET | Freq: Every day | ORAL | Status: AC | PRN

## 2015-11-30 NOTE — Progress Notes (Signed)
Patient ID: [name], female   DOB: 05/29/1941, 75 y.o.   MRN: [id number]    Subjective:    Patient ID: [name], female    DOB: 25-Nov-1940, 75 y.o.   MRN: [id number]  Patient presents for 6 month F/U and Illness  Patient here for follow-up on chronic medical problems. She is taking her blood pressure medicine as prescribed. She does not have any difficulties with her regular medications. She does complain of sinus pressure and drainage worsening over the past 2 weeks. She does have underlying allergies but she is not treating these. She's had some chills but not sure of any particular fever. She also has some mild cough mostly from dryness in her throat. She's been taking Tylenol Cold and cough medication.  Her weight is down 9 pounds since her last visit 4 months ago she states that she started drinking protein shakes or boost instead of skipping meals and she has been more active.    Review Of Systems:  GEN- denies fatigue, fever, weight loss,weakness, recent illness HEENT- denies eye drainage, change in vision, +nasal discharge, CVS- denies chest pain, palpitations RESP- denies SOB, +cough, wheeze ABD- denies N/V, change in stools, abd pain GU- denies dysuria, hematuria, dribbling, incontinence MSK- denies joint pain, muscle aches, injury Neuro- denies headache, dizziness, syncope, seizure activity       Objective:    BP 128/78 mmHg  Pulse 66  Temp(Src) 97.8 F (36.6 C) (Oral)  Resp 18  Ht 5\' 1"  (1.549 m)  Wt 216 lb (97.977 kg)  BMI 40.83 kg/m2 GEN- NAD, alert and oriented x3 HEENT- PERRL, EOMI, non injected sclera, pink conjunctiva, MMM, oropharynx clear, TM clear bilat no effusion, mild  maxillary sinus tenderness, inflammed turbinates,  Nasal drainage  Neck- Supple, no LAD CVS- RRR, no murmur RESP-CTAB EXT- No edema Pulses- Radial 2+         Assessment & Plan:      Problem List Items Addressed This Visit    Obesity    Congratulated on  weight loss, continues to eat healthy meals       GERD (gastroesophageal reflux disease)    Continue protonix      Essential hypertension, benign - Primary    Well controlled, no change in meds       Other Visit Diagnoses    Acute maxillary sinusitis, recurrence not specified        Take amoxicllin as prescribed, use flonase     Relevant Medications    fluticasone (FLONASE) 50 MCG/ACT nasal spray    loratadine (CLARITIN) 10 MG tablet    amoxicillin (AMOXIL) 875 MG tablet    Seasonal allergies        Start claritin and flonase        Note: This dictation was prepared with Diplomatic Services operational officer dictation along with smaller Company secretary. Any transcriptional errors that result from this process are unintentional.

## 2015-11-30 NOTE — Assessment & Plan Note (Signed)
Continue protonix  

## 2015-11-30 NOTE — Assessment & Plan Note (Signed)
Congratulated on weight loss, continues to eat healthy meals

## 2015-11-30 NOTE — Assessment & Plan Note (Signed)
Well controlled, no change in meds 

## 2015-11-30 NOTE — Patient Instructions (Signed)
Take antibiotics Flonase and clartin for allergies You can stop the cold and cough pill Continue current medications F/IU 4 months

## 2016-01-27 ENCOUNTER — Other Ambulatory Visit: Payer: Self-pay | Admitting: *Deleted

## 2016-01-27 MED ORDER — PANTOPRAZOLE SODIUM 40 MG PO TBEC: 40.0000 mg | DELAYED_RELEASE_TABLET | Freq: Every day | ORAL | Status: DC

## 2016-01-27 NOTE — Telephone Encounter (Signed)
Received fax requesting refill on pantoprazole.  Refill appropriate and filled per protocol.

## 2016-04-01 ENCOUNTER — Ambulatory Visit (INDEPENDENT_AMBULATORY_CARE_PROVIDER_SITE_OTHER): Payer: Medicare Other | Admitting: Family Medicine

## 2016-04-01 ENCOUNTER — Encounter: Payer: Self-pay | Admitting: Family Medicine

## 2016-04-01 VITALS — BP 140/68 | HR 82 | Temp 98.6°F | Resp 14 | Ht 61.0 in | Wt 222.0 lb

## 2016-04-01 DIAGNOSIS — I10 Essential (primary) hypertension: Secondary | ICD-10-CM

## 2016-04-01 DIAGNOSIS — E785 Hyperlipidemia, unspecified: Secondary | ICD-10-CM

## 2016-04-01 DIAGNOSIS — M109 Gout, unspecified: Secondary | ICD-10-CM | POA: Insufficient documentation

## 2016-04-01 DIAGNOSIS — M1000 Idiopathic gout, unspecified site: Secondary | ICD-10-CM

## 2016-04-01 DIAGNOSIS — E669 Obesity, unspecified: Secondary | ICD-10-CM

## 2016-04-01 LAB — CBC WITH DIFFERENTIAL/PLATELET
BASOS PCT: 0 %
Basophils Absolute: 0 cells/uL (ref 0–200)
EOS PCT: 2 %
Eosinophils Absolute: 94 cells/uL (ref 15–500)
HCT: 42.4 % (ref 35.0–45.0)
Hemoglobin: 13.7 g/dL (ref 12.0–15.0)
LYMPHS PCT: 45 %
Lymphs Abs: 2115 cells/uL (ref 850–3900)
MCH: 32.3 pg (ref 27.0–33.0)
MCHC: 32.3 g/dL (ref 32.0–36.0)
MCV: 100 fL (ref 80.0–100.0)
MONOS PCT: 7 %
MPV: 10.4 fL (ref 7.5–12.5)
Monocytes Absolute: 329 cells/uL (ref 200–950)
NEUTROS ABS: 2162 {cells}/uL (ref 1500–7800)
Neutrophils Relative %: 46 %
PLATELETS: 279 10*3/uL (ref 140–400)
RBC: 4.24 MIL/uL (ref 3.80–5.10)
RDW: 12.1 % (ref 11.0–15.0)
WBC: 4.7 10*3/uL (ref 3.8–10.8)

## 2016-04-01 LAB — COMPREHENSIVE METABOLIC PANEL
ALK PHOS: 71 U/L (ref 33–130)
ALT: 14 U/L (ref 6–29)
AST: 35 U/L (ref 10–35)
Albumin: 3.8 g/dL (ref 3.6–5.1)
BUN: 18 mg/dL (ref 7–25)
CO2: 27 mmol/L (ref 20–31)
CREATININE: 1.02 mg/dL — AB (ref 0.60–0.93)
Calcium: 9.6 mg/dL (ref 8.6–10.4)
Chloride: 102 mmol/L (ref 98–110)
GLUCOSE: 83 mg/dL (ref 70–99)
POTASSIUM: 4 mmol/L (ref 3.5–5.3)
SODIUM: 140 mmol/L (ref 135–146)
TOTAL PROTEIN: 7.4 g/dL (ref 6.1–8.1)
Total Bilirubin: 0.6 mg/dL (ref 0.2–1.2)

## 2016-04-01 LAB — LIPID PANEL
CHOL/HDL RATIO: 2.1 ratio (ref <=5.0)
Cholesterol: 168 mg/dL (ref 125–200)
HDL: 81 mg/dL (ref >=46)
LDL Cholesterol: 75 mg/dL (ref <130)
Triglycerides: 60 mg/dL (ref <150)
VLDL: 12 mg/dL (ref <30)

## 2016-04-01 NOTE — Progress Notes (Signed)
   Subjective:    Patient ID: [name], female    DOB: March 18, 1941, 75 y.o.   MRN: [id number]  Patient presents for 4 month F/U (is fasting)  Patient here to follow-up chronic medical problems. She is taking her medicines as described. She has no specific concerns today. She is due for fasting labs for cholesterol renal function She has gained 6 pounds since her last visit  No difficulties with gout she is on allopurinol  Review Of Systems:  GEN- denies fatigue, fever, weight loss,weakness, recent illness HEENT- denies eye drainage, change in vision, nasal discharge, CVS- denies chest pain, palpitations RESP- denies SOB, cough, wheeze ABD- denies N/V, change in stools, abd pain GU- denies dysuria, hematuria, dribbling, incontinence MSK- denies joint pain, muscle aches, injury Neuro- denies headache, dizziness, syncope, seizure activity       Objective:    BP 140/68 (BP Location: Left Arm, Patient Position: Sitting, Cuff Size: Large)   Pulse 82   Temp 98.6 F (37 C) (Oral)   Resp 14   Ht 5\' 1"  (1.549 m)   Wt 222 lb (100.7 kg)   BMI 41.95 kg/m  GEN- NAD, alert and oriented x3 HEENT- PERRL, EOMI, non injected sclera, pink conjunctiva, MMM, oropharynx clear Neck- Supple, no thyromegaly CVS- RRR, no murmur RESP-CTAB EXT- No edema Pulses- Radial, DP- 2+        Assessment & Plan:      Problem List Items Addressed This Visit    Obesity   Mild hyperlipidemia    Recheck cholesterol panel Currently on aspirin Discussed monitoring diet she has been eating a lot of junk food taking care of her great grandchild she admits this is where the weight gain came from      Relevant Orders   Lipid panel   Gout    Controlled on allopurinol, no recent flares      Essential hypertension, benign - Primary    *Morning typically blood pressures well controlled for her age no changes      Relevant Orders   CBC with Differential/Platelet   Comprehensive metabolic  panel    Other Visit Diagnoses   None.     Note: This dictation was prepared with Dragon dictation along with smaller phrase technology. Any transcriptional errors that result from this process are unintentional.

## 2016-04-01 NOTE — Assessment & Plan Note (Signed)
Controlled on allopurinol, no recent flares

## 2016-04-01 NOTE — Assessment & Plan Note (Signed)
*  Morning typically blood pressures well controlled for her age no changes

## 2016-04-01 NOTE — Assessment & Plan Note (Addendum)
Recheck cholesterol panel Currently on aspirin Discussed monitoring diet she has been eating a lot of junk food taking care of her great grandchild she admits this is where the weight gain came from

## 2016-04-01 NOTE — Patient Instructions (Addendum)
Call back is sinuses do not improve with allergy medication  F/U 4 months for Physical

## 2016-04-19 ENCOUNTER — Other Ambulatory Visit: Payer: Self-pay | Admitting: Family Medicine

## 2016-04-20 NOTE — Telephone Encounter (Signed)
Refill appropriate and filled per protocol. 

## 2016-04-21 ENCOUNTER — Encounter: Payer: Self-pay | Admitting: Orthopaedic Surgery

## 2016-04-21 ENCOUNTER — Ambulatory Visit (INDEPENDENT_AMBULATORY_CARE_PROVIDER_SITE_OTHER): Payer: Medicare Other | Admitting: Orthopaedic Surgery

## 2016-04-21 ENCOUNTER — Ambulatory Visit (INDEPENDENT_AMBULATORY_CARE_PROVIDER_SITE_OTHER): Payer: Medicare Other

## 2016-04-21 VITALS — BP 139/89 | HR 91 | Temp 97.2°F | Ht 61.0 in | Wt 216.0 lb

## 2016-04-21 DIAGNOSIS — M1A00X0 Idiopathic chronic gout, unspecified site, without tophus (tophi): Secondary | ICD-10-CM

## 2016-04-21 DIAGNOSIS — I10 Essential (primary) hypertension: Secondary | ICD-10-CM

## 2016-04-21 DIAGNOSIS — K219 Gastro-esophageal reflux disease without esophagitis: Secondary | ICD-10-CM | POA: Diagnosis not present

## 2016-04-21 DIAGNOSIS — M25562 Pain in left knee: Secondary | ICD-10-CM | POA: Diagnosis not present

## 2016-04-21 MED ORDER — NAPROXEN 500 MG PO TABS
500.0000 mg | ORAL_TABLET | Freq: Two times a day (BID) | ORAL | 5 refills | Status: DC
Start: 2016-04-21 — End: 2017-01-31

## 2016-04-21 NOTE — Progress Notes (Signed)
Subjective: my left knee hurts    Patient ID: [name], female    DOB: 12-May-1941, 75 y.o.   MRN: [id number]  HPI She has had pain in the left knee for about four to six weeks.  She has no trauma.  She has swelling and popping.  She has no paresthesias or giving way. She has pain with walking.  She has tried Aleve with some help.  Heat and ice help for a short time.  She has history of gout but her knee pain does not have the pain she has had with gout in the past and it has not been red.  She has hypertension.  It is well controlled.  Review of Systems  HENT: Negative for congestion.   Respiratory: Negative for cough and shortness of breath.   Cardiovascular: Negative for chest pain.  Endocrine: Positive for cold intolerance.  Musculoskeletal: Positive for arthralgias, gait problem and joint swelling.  Allergic/Immunologic: Positive for environmental allergies.   Past Medical History:  Diagnosis Date  . Acid reflux   . Arthritis   . Gout   . Gout   . Hypertension   . Shortness of breath     Past Surgical History:  Procedure Laterality Date  . MASS EXCISION N/A 04/11/2014   Procedure: EXCISION SOFT TISSUE NEOPLASM,  BACK;  Surgeon: Jamesetta So, MD;  Location: [facility];  Service: General;  Laterality: N/A;  . TONSILLECTOMY    . TUBAL LIGATION      Current Outpatient Prescriptions on File Prior to Visit  Medication Sig Dispense Refill  . albuterol (PROVENTIL HFA;VENTOLIN HFA) 108 (90 BASE) MCG/ACT inhaler Inhale 2 puffs into the lungs every 6 (six) hours as needed for wheezing or shortness of breath. 1 Inhaler 0  . allopurinol (ZYLOPRIM) 100 MG tablet Take 1 tablet (100 mg total) by mouth daily. 30 tablet 6  . aspirin EC 81 MG tablet Take 81 mg by mouth daily.      . Calcium Carbonate-Vitamin D (CALCIUM-D) 600-400 MG-UNIT TABS Take by mouth 2 (two) times daily.    . diclofenac sodium (VOLTAREN) 1 % GEL Apply 2 g topically 4 (four) times daily. Pain 100 g 3  .  fluticasone (FLONASE) 50 MCG/ACT nasal spray Place 2 sprays into both nostrils daily. 16 g 6  . loratadine (CLARITIN) 10 MG tablet Take 1 tablet (10 mg total) by mouth daily as needed for allergies. 30 tablet 11  . pantoprazole (PROTONIX) 40 MG tablet Take 1 tablet (40 mg total) by mouth daily. 90 tablet 1  . triamterene-hydrochlorothiazide (MAXZIDE-25) 37.5-25 MG tablet take 1 tablet by mouth once daily 90 tablet 3  . [DISCONTINUED] omeprazole (PRILOSEC) 20 MG capsule Take 20 mg by mouth daily.       No current facility-administered medications on file prior to visit.     Social History   Social History  . Marital status: Widowed    Spouse name: N/A  . Number of children: N/A  . Years of education: N/A   Occupational History  . Not on file.   Social History Main Topics  . Smoking status: Never Smoker  . Smokeless tobacco: Never Used  . Alcohol use No  . Drug use: No  . Sexual activity: Yes    Birth control/ protection: Post-menopausal   Other Topics Concern  . Not on file   Social History Narrative  . No narrative on file    Family History  Problem Relation Age of Onset  .  Cancer Mother   . Diabetes Father     BP 139/89   Pulse 91   Temp 97.2 F (36.2 C)   Ht 5\' 1"  (1.549 m)   Wt 216 lb (98 kg)   BMI 40.81 kg/m      Objective:   Physical Exam  Constitutional: She is oriented to person, place, and time. She appears well-developed and well-nourished.  HENT:  Head: Normocephalic and atraumatic.  Eyes: Conjunctivae and EOM are normal. Pupils are equal, round, and reactive to light.  Neck: Normal range of motion. Neck supple.  Cardiovascular: Normal rate, regular rhythm and intact distal pulses.   Pulmonary/Chest: Effort normal.  Abdominal: Soft.  Musculoskeletal: She exhibits tenderness. Edema: Left knee pain is present with 1+ effusion, ROM 0 to 105, limp to the left, knee stable, right knee negative.  No distal edema.  Neurological: She is alert and  oriented to person, place, and time. She displays normal reflexes. No cranial nerve deficit. She exhibits normal muscle tone. Coordination normal.  Skin: Skin is warm and dry.  Psychiatric: She has a normal mood and affect. Her behavior is normal. Judgment and thought content normal.    X-rays were done of the left knee, reported separately.     Assessment & Plan:   Encounter Diagnoses  Name Primary?  . Left knee pain Yes  . Essential hypertension, benign   . Gastroesophageal reflux disease without esophagitis   . Idiopathic chronic gout without tophus, unspecified site    PROCEDURE NOTE:  The patient requests injections of the left knee , verbal consent was obtained.  The left knee was prepped appropriately after time out was performed.   Sterile technique was observed and injection of 1 cc of Depo-Medrol 40 mg with several cc's of plain xylocaine. Anesthesia was provided by ethyl chloride and a 20-gauge needle was used to inject the knee area. The injection was tolerated well.  A band aid dressing was applied.  The patient was advised to apply ice later today and tomorrow to the injection sight as needed.  Begin Naprosyn, precautions given.  BID PC, stop if any GI problems.  Return in one month.  Call if any problem.  Precautions discussed.  Electronically Signed Sanjuana Kava, MD 8/24/20179:39 PM

## 2016-05-19 ENCOUNTER — Ambulatory Visit (INDEPENDENT_AMBULATORY_CARE_PROVIDER_SITE_OTHER): Payer: Medicare Other | Admitting: Orthopaedic Surgery

## 2016-05-19 ENCOUNTER — Encounter: Payer: Self-pay | Admitting: Orthopaedic Surgery

## 2016-05-19 VITALS — BP 130/74 | HR 86 | Temp 97.5°F | Ht 61.0 in | Wt 216.0 lb

## 2016-05-19 DIAGNOSIS — M25562 Pain in left knee: Secondary | ICD-10-CM

## 2016-05-19 DIAGNOSIS — M1A00X0 Idiopathic chronic gout, unspecified site, without tophus (tophi): Secondary | ICD-10-CM

## 2016-05-19 DIAGNOSIS — K219 Gastro-esophageal reflux disease without esophagitis: Secondary | ICD-10-CM

## 2016-05-19 DIAGNOSIS — I10 Essential (primary) hypertension: Secondary | ICD-10-CM

## 2016-05-19 NOTE — Progress Notes (Signed)
Patient [id number] [name], female DOB:1940-09-14, 75 y.o. [id number]  Chief Complaint  Patient presents with  . Follow-up    left knee pain    HPI  [name] is a 75 y.o. female who has had left knee pain.  She had an injection last time.  She is taking Aleve.  She is much improved. She has little pain now. 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 She has no new trauma. HPI  Body mass index is 40.81 kg/m.  ROS  Review of Systems  HENT: Negative for congestion.   Respiratory: Negative for cough and shortness of breath.   Cardiovascular: Negative for chest pain.  Endocrine: Positive for cold intolerance.  Musculoskeletal: Positive for arthralgias, gait problem and joint swelling.  Allergic/Immunologic: Positive for environmental allergies.    Past Medical History:  Diagnosis Date  . Acid reflux   . Arthritis   . Gout   . Gout   . Hypertension   . Shortness of breath     Past Surgical History:  Procedure Laterality Date  . MASS EXCISION N/A 04/11/2014   Procedure: EXCISION SOFT TISSUE NEOPLASM,  BACK;  Surgeon: Jamesetta So, MD;  Location: [facility];  Service: General;  Laterality: N/A;  . TONSILLECTOMY    . TUBAL LIGATION      Family History  Problem Relation Age of Onset  . Cancer Mother   . Diabetes Father     Social History Social History  Substance Use Topics  . Smoking status: Never Smoker  . Smokeless tobacco: Never Used  . Alcohol use No    No Known Allergies  Current Outpatient Prescriptions  Medication Sig Dispense Refill  . albuterol (PROVENTIL HFA;VENTOLIN HFA) 108 (90 BASE) MCG/ACT inhaler Inhale 2 puffs into the lungs every 6 (six) hours as needed for wheezing or shortness of breath. 1 Inhaler 0  . allopurinol (ZYLOPRIM) 100 MG tablet Take 1 tablet (100 mg total) by mouth daily. 30 tablet 6  . aspirin EC 81 MG tablet Take 81 mg by mouth daily.      . Calcium Carbonate-Vitamin D (CALCIUM-D) 600-400 MG-UNIT  TABS Take by mouth 2 (two) times daily.    . diclofenac sodium (VOLTAREN) 1 % GEL Apply 2 g topically 4 (four) times daily. Pain 100 g 3  . fluticasone (FLONASE) 50 MCG/ACT nasal spray Place 2 sprays into both nostrils daily. 16 g 6  . loratadine (CLARITIN) 10 MG tablet Take 1 tablet (10 mg total) by mouth daily as needed for allergies. 30 tablet 11  . naproxen (NAPROSYN) 500 MG tablet Take 1 tablet (500 mg total) by mouth 2 (two) times daily with a meal. 60 tablet 5  . pantoprazole (PROTONIX) 40 MG tablet Take 1 tablet (40 mg total) by mouth daily. 90 tablet 1  . triamterene-hydrochlorothiazide (MAXZIDE-25) 37.5-25 MG tablet take 1 tablet by mouth once daily 90 tablet 3   No current facility-administered medications for this visit.      Physical Exam  Blood pressure 130/74, pulse 86, temperature 97.5 F (36.4 C), height 5\' 1"  (1.549 m), weight 216 lb (98 kg).  Constitutional: overall normal hygiene, normal nutrition, well developed, normal grooming, normal body habitus. Assistive device:none  Musculoskeletal: gait and station Limp none, muscle tone and strength are normal, no tremors or atrophy is present.  .  Neurological: coordination overall normal.  Deep tendon reflex/nerve stretch intact.  Sensation normal.  Cranial nerves II-XII intact.   Skin:  Normal overall no scars, lesions, ulcers or rashes. No psoriasis.  Psychiatric: Alert and oriented x 3.  Recent memory intact, remote memory unclear.  Normal mood and affect. Well groomed.  Good eye contact.  Cardiovascular: overall no swelling, no varicosities, no edema bilaterally, normal temperatures of the legs and arms, no clubbing, cyanosis and good capillary refill.  Lymphatic: palpation is normal.  The left lower extremity is examined:  Inspection:  Thigh:  Non-tender and no defects  Knee has swelling 1/2+effusion.                        Joint tenderness is present                        Patient is not tender over the  medial joint line  Lower Leg:  Has normal appearance and no tenderness or defects  Ankle:  Non-tender and no defects  Foot:  Non-tender and no defects Range of Motion:  Knee:  Range of motion is: 0-110                        Crepitus is  present  Ankle:  Range of motion is normal. Strength and Tone:  The left lower extremity has normal strength and tone. Stability:  Knee:  The knee is stable.  Ankle:  The ankle is stable.    The patient has been educated about the nature of the problem(s) and counseled on treatment options.  The patient appeared to understand what I have discussed and is in agreement with it.  Encounter Diagnoses  Name Primary?  . Left knee pain Yes  . Essential hypertension, benign   . Idiopathic chronic gout without tophus, unspecified site   . Gastroesophageal reflux disease without esophagitis     PLAN Call if any problems.  Precautions discussed.  Continue current medications.   Return to clinic PRN   Electronically Signed Sanjuana Kava, MD 9/21/201710:35 AM

## 2016-08-02 ENCOUNTER — Encounter: Payer: Self-pay | Admitting: Family Medicine

## 2016-08-02 ENCOUNTER — Other Ambulatory Visit: Payer: Self-pay | Admitting: Family Medicine

## 2016-08-02 ENCOUNTER — Ambulatory Visit (INDEPENDENT_AMBULATORY_CARE_PROVIDER_SITE_OTHER): Payer: Medicare Other | Admitting: Family Medicine

## 2016-08-02 VITALS — BP 132/76 | HR 88 | Temp 98.7°F | Resp 14 | Ht 61.0 in | Wt 216.0 lb

## 2016-08-02 DIAGNOSIS — Z0000 Encounter for general adult medical examination without abnormal findings: Secondary | ICD-10-CM | POA: Diagnosis not present

## 2016-08-02 DIAGNOSIS — E785 Hyperlipidemia, unspecified: Secondary | ICD-10-CM

## 2016-08-02 DIAGNOSIS — Z23 Encounter for immunization: Secondary | ICD-10-CM

## 2016-08-02 DIAGNOSIS — I10 Essential (primary) hypertension: Secondary | ICD-10-CM | POA: Diagnosis not present

## 2016-08-02 DIAGNOSIS — E559 Vitamin D deficiency, unspecified: Secondary | ICD-10-CM

## 2016-08-02 LAB — CBC WITH DIFFERENTIAL/PLATELET
Basophils Absolute: 52 cells/uL (ref 0–200)
Basophils Relative: 1 %
EOS PCT: 1 %
Eosinophils Absolute: 52 cells/uL (ref 15–500)
HCT: 41.1 % (ref 35.0–45.0)
Hemoglobin: 13.2 g/dL (ref 12.0–15.0)
LYMPHS ABS: 2444 {cells}/uL (ref 850–3900)
LYMPHS PCT: 47 %
MCH: 33.2 pg — ABNORMAL HIGH (ref 27.0–33.0)
MCHC: 32.1 g/dL (ref 32.0–36.0)
MCV: 103.3 fL — ABNORMAL HIGH (ref 80.0–100.0)
MONOS PCT: 6 %
MPV: 10.2 fL (ref 7.5–12.5)
Monocytes Absolute: 312 cells/uL (ref 200–950)
NEUTROS PCT: 45 %
Neutro Abs: 2340 cells/uL (ref 1500–7800)
PLATELETS: 277 10*3/uL (ref 140–400)
RBC: 3.98 MIL/uL (ref 3.80–5.10)
RDW: 11.8 % (ref 11.0–15.0)
WBC: 5.2 10*3/uL (ref 3.8–10.8)

## 2016-08-02 LAB — COMPREHENSIVE METABOLIC PANEL
ALT: 13 U/L (ref 6–29)
AST: 31 U/L (ref 10–35)
Albumin: 3.8 g/dL (ref 3.6–5.1)
Alkaline Phosphatase: 66 U/L (ref 33–130)
BILIRUBIN TOTAL: 0.4 mg/dL (ref 0.2–1.2)
BUN: 24 mg/dL (ref 7–25)
CHLORIDE: 105 mmol/L (ref 98–110)
CO2: 28 mmol/L (ref 20–31)
Calcium: 9.5 mg/dL (ref 8.6–10.4)
Creat: 1.12 mg/dL — ABNORMAL HIGH (ref 0.60–0.93)
Glucose, Bld: 131 mg/dL — ABNORMAL HIGH (ref 70–99)
Potassium: 3.6 mmol/L (ref 3.5–5.3)
SODIUM: 142 mmol/L (ref 135–146)
Total Protein: 7.1 g/dL (ref 6.1–8.1)

## 2016-08-02 LAB — LIPID PANEL
Cholesterol: 180 mg/dL (ref <200)
HDL: 75 mg/dL (ref >50)
LDL CALC: 90 mg/dL (ref <100)
Total CHOL/HDL Ratio: 2.4 Ratio (ref <5.0)
Triglycerides: 77 mg/dL (ref <150)
VLDL: 15 mg/dL (ref <30)

## 2016-08-02 MED ORDER — ALBUTEROL SULFATE HFA 108 (90 BASE) MCG/ACT IN AERS
2.0000 | INHALATION_SPRAY | Freq: Four times a day (QID) | RESPIRATORY_TRACT | 0 refills | Status: DC | PRN
Start: 2016-08-02 — End: 2017-08-02

## 2016-08-02 MED ORDER — ZOSTER VACCINE LIVE 19400 UNT/0.65ML ~~LOC~~ SUSR: 0.6500 mL | Freq: Once | SUBCUTANEOUS | 0 refills | Status: AC

## 2016-08-02 MED ORDER — FLUTICASONE PROPIONATE 50 MCG/ACT NA SUSP: 2.0000 | Freq: Every day | NASAL | 6 refills | Status: DC

## 2016-08-02 NOTE — Patient Instructions (Signed)
Look at the Living Will Forms We will call with labs Flu shot given F/U 6 months

## 2016-08-02 NOTE — Progress Notes (Signed)
Subjective:   Patient presents for Medicare Annual/Subsequent preventive examination.  Patient here for annual wellness exam Review Past Medical/Family/Social: EMR   No concerns   Risk Factors  Current exercise habits: HTN  Dietary issues discussed: Yes  Cardiac risk factors: Obesity (BMI >= 30 kg/m2). HTN   Depression Screen  (Note: if answer to either of the following is "Yes", a more complete depression screening is indicated)  Over the past two weeks, have you felt down, depressed or hopeless? No Over the past two weeks, have you felt little interest or pleasure in doing things? No Have you lost interest or pleasure in daily life? No Do you often feel hopeless? No Do you cry easily over simple problems? No   Activities of Daily Living  In your present state of health, do you have any difficulty performing the following activities?:  Driving? No  Managing money? No  Feeding yourself? No  Getting from bed to chair? No  Climbing a flight of stairs? No  Preparing food and eating?: No  Bathing or showering? No  Getting dressed: No  Getting to the toilet? No  Using the toilet:No  Moving around from place to place: No  In the past year have you fallen or had a near fall?:No  Are you sexually active? No  Do you have more than one partner? No   Hearing Difficulties: No  Do you often ask people to speak up or repeat themselves? No  Do you experience ringing or noises in your ears? No Do you have difficulty understanding soft or whispered voices? No  Do you feel that you have a problem with memory? No Do you often misplace items? No  Do you feel safe at home? Yes  Cognitive Testing  Alert? Yes Normal Appearance?Yes  Oriented to person? Yes Place? Yes  Time? Yes  Recall of three objects? Yes  Can perform simple calculations? Yes  Displays appropriate judgment?Yes  Can read the correct time from a watch face?Yes   List the Names of Other Physician/Practitioners you  currently use:   Dr. Luna Glasgow, prn Dr. Berline Lopes- podiatry prn   Screening Tests / Date Colonoscopy   Declines                   Zostavax- Due  Mammogram  Declines  Influenza Vaccine - Due Tetanus/tdap Pt declines Pneumonia- UTD   ROS:  GEN- denies fatigue, fever, weight loss,weakness, recent illness HEENT- denies eye drainage, change in vision, nasal discharge, CVS- denies chest pain, palpitations RESP- denies SOB, cough, wheeze ABD- denies N/V, change in stools, abd pain GU- denies dysuria, hematuria, dribbling, incontinence MSK- denies joint pain, muscle aches, injury Neuro- denies headache, dizziness, syncope, seizure activity  GEN- NAD, alert and oriented x3 HEENT- PERRL, EOMI, non injected sclera, pink conjunctiva, MMM, oropharynx clear, TM Clear bilat no effusion  Neck- Supple, no thryomegaly CVS- RRR, no murmur RESP-CTAB ABD-NABS,soft,NT,ND  EXT- No edema Pulses- Radial, DP- 2+     Assessment:    Annual wellness medicare exam   Plan:    During the course of the visit the patient was educated and counseled about appropriate screening and preventive services including:   Flu shot given  Discussed Advanced directions- given copy to complete  Screen Neg for depression  Labs today  Shingles vaccine sent to pharmacy  Blood pressure controlled , no change to medication, discussed staying active, 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 Yes ____ Not Indicated __x__  Patient Instructions (the written plan) was given to the patient.  Medicare Attestation  I have personally reviewed:  The patient's medical and social history  Their use of alcohol, tobacco or illicit drugs  Their current medications and supplements  The patient's functional ability including ADLs,fall risks, home safety risks, cognitive, and hearing and visual impairment  Diet and physical activities  Evidence for depression or mood disorders   The patient's weight, height, BMI, and visual acuity have been recorded in the chart. I have made referrals, counseling, and provided education to the patient based on review of the above and I have provided the patient with a written personalized care plan for preventive services.

## 2016-08-03 LAB — VITAMIN D 25 HYDROXY (VIT D DEFICIENCY, FRACTURES): Vit D, 25-Hydroxy: 24 ng/mL — ABNORMAL LOW (ref 30–100)

## 2016-08-04 LAB — HEMOGLOBIN A1C
HEMOGLOBIN A1C: 4.8 % (ref <5.7)
Mean Plasma Glucose: 91 mg/dL

## 2016-10-29 ENCOUNTER — Other Ambulatory Visit: Payer: Self-pay | Admitting: Family Medicine

## 2017-01-31 ENCOUNTER — Encounter: Payer: Self-pay | Admitting: Family Medicine

## 2017-01-31 ENCOUNTER — Ambulatory Visit (INDEPENDENT_AMBULATORY_CARE_PROVIDER_SITE_OTHER): Payer: Medicare Other | Admitting: Family Medicine

## 2017-01-31 VITALS — BP 130/74 | HR 68 | Temp 97.9°F | Resp 16 | Ht 61.0 in | Wt 217.0 lb

## 2017-01-31 DIAGNOSIS — M1A00X0 Idiopathic chronic gout, unspecified site, without tophus (tophi): Secondary | ICD-10-CM | POA: Diagnosis not present

## 2017-01-31 DIAGNOSIS — R42 Dizziness and giddiness: Secondary | ICD-10-CM

## 2017-01-31 DIAGNOSIS — I10 Essential (primary) hypertension: Secondary | ICD-10-CM | POA: Diagnosis not present

## 2017-01-31 DIAGNOSIS — E785 Hyperlipidemia, unspecified: Secondary | ICD-10-CM

## 2017-01-31 LAB — CBC WITH DIFFERENTIAL/PLATELET
BASOS PCT: 1 %
Basophils Absolute: 41 cells/uL (ref 0–200)
EOS ABS: 82 {cells}/uL (ref 15–500)
Eosinophils Relative: 2 %
HCT: 41.6 % (ref 35.0–45.0)
HEMOGLOBIN: 13.3 g/dL (ref 12.0–15.0)
LYMPHS ABS: 1681 {cells}/uL (ref 850–3900)
Lymphocytes Relative: 41 %
MCH: 32.5 pg (ref 27.0–33.0)
MCHC: 32 g/dL (ref 32.0–36.0)
MCV: 101.7 fL — AB (ref 80.0–100.0)
MONO ABS: 328 {cells}/uL (ref 200–950)
MONOS PCT: 8 %
MPV: 10.2 fL (ref 7.5–12.5)
NEUTROS ABS: 1968 {cells}/uL (ref 1500–7800)
Neutrophils Relative %: 48 %
PLATELETS: 282 10*3/uL (ref 140–400)
RBC: 4.09 MIL/uL (ref 3.80–5.10)
RDW: 12.2 % (ref 11.0–15.0)
WBC: 4.1 10*3/uL (ref 3.8–10.8)

## 2017-01-31 MED ORDER — TRIAMTERENE-HCTZ 37.5-25 MG PO TABS: 1.0000 | ORAL_TABLET | Freq: Every day | ORAL | 3 refills | Status: DC

## 2017-01-31 MED ORDER — NAPROXEN 500 MG PO TABS: 500.0000 mg | ORAL_TABLET | Freq: Two times a day (BID) | ORAL | 5 refills | Status: DC

## 2017-01-31 MED ORDER — PANTOPRAZOLE SODIUM 40 MG PO TBEC: 40.0000 mg | DELAYED_RELEASE_TABLET | Freq: Every day | ORAL | 1 refills | Status: DC

## 2017-01-31 NOTE — Assessment & Plan Note (Signed)
Check lipids 

## 2017-01-31 NOTE — Assessment & Plan Note (Signed)
Controlled, no changes to meds  

## 2017-01-31 NOTE — Assessment & Plan Note (Signed)
Continue allopurinol check her renal function as well as her uric acid level. We may need to adjust her allopurinol up to 200 mg.

## 2017-01-31 NOTE — Progress Notes (Signed)
   Subjective:    Patient ID: [name], female    DOB: 07-Oct-1940, 76 y.o.   MRN: [id number]  Patient presents for 6 month F/U (is fasting)    Here to follow up chronic medical problems.    Gout- on allopurinol,has had some swelling in foot and left knee,      Osteoarthritis- has had pain in hands, knees, orthopedoics Dr. Luna Glasgow has her on naprsoyn twice a day for pain      Has not done any walking    HTN- no difficulties with blood pressure pill    History of acute bronchitis/allergies- occasionaly uses inhaler  She does occasionally get some days his face that she bends over too quickly or gets up quickly he will may last a couple seconds. She's not had any falls related.  Review Of Systems:  GEN- denies fatigue, fever, weight loss,weakness, recent illness HEENT- denies eye drainage, change in vision, nasal discharge, CVS- denies chest pain, palpitations RESP- denies SOB, cough, wheeze ABD- denies N/V, change in stools, abd pain GU- denies dysuria, hematuria, dribbling, incontinence MSK- +joint pain, muscle aches, injury Neuro- denies headache, +dizziness, syncope, seizure activity       Objective:    BP 130/74   Pulse 68   Temp 97.9 F (36.6 C) (Oral)   Resp 16   Ht 5\' 1"  (1.549 m)   Wt 217 lb (98.4 kg)   SpO2 99%   BMI 41.00 kg/m  GEN- NAD, alert and oriented x3 HEENT- PERRL, EOMI, non injected sclera, pink conjunctiva, MMM, oropharynx clear Neck- Supple, no thyromegaly, no bruit CVS- RRR, no murmur RESP-CTAB ABD-NABS,soft,NT,ND NEURO-CNII-XII in tact, no deficits  EXT- No edema Pulses- Radial, DP- 2+        Assessment & Plan:      Problem List Items Addressed This Visit    Mild hyperlipidemia    Check lipids      Relevant Medications   triamterene-hydrochlorothiazide (MAXZIDE-25) 37.5-25 MG tablet   Other Relevant Orders   Lipid panel   Gout - Primary    Continue allopurinol check her renal function as well as her uric acid level.  We may need to adjust her allopurinol up to 200 mg.      Relevant Orders   Uric Acid   Essential hypertension, benign    Controlled, no changes to meds      Relevant Medications   triamterene-hydrochlorothiazide (MAXZIDE-25) 37.5-25 MG tablet   Other Relevant Orders   CBC with Differential/Platelet   Comprehensive metabolic panel    Other Visit Diagnoses    Dizziness       No neurological deficits. Appears to be mild with position changes. Discussed sitting inside the bed before getting up quickly, no new meds      Note: This dictation was prepared with Dragon dictation along with smaller phrase technology. Any transcriptional errors that result from this process are unintentional.

## 2017-01-31 NOTE — Patient Instructions (Signed)
F/U 6 months for Physical  

## 2017-02-01 LAB — COMPREHENSIVE METABOLIC PANEL
ALK PHOS: 67 U/L (ref 33–130)
ALT: 12 U/L (ref 6–29)
AST: 36 U/L — ABNORMAL HIGH (ref 10–35)
Albumin: 3.8 g/dL (ref 3.6–5.1)
BUN: 19 mg/dL (ref 7–25)
CO2: 25 mmol/L (ref 20–31)
CREATININE: 1.11 mg/dL — AB (ref 0.60–0.93)
Calcium: 10 mg/dL (ref 8.6–10.4)
Chloride: 105 mmol/L (ref 98–110)
Glucose, Bld: 80 mg/dL (ref 70–99)
POTASSIUM: 3.9 mmol/L (ref 3.5–5.3)
Sodium: 142 mmol/L (ref 135–146)
TOTAL PROTEIN: 7.3 g/dL (ref 6.1–8.1)
Total Bilirubin: 0.6 mg/dL (ref 0.2–1.2)

## 2017-02-01 LAB — LIPID PANEL
CHOLESTEROL: 182 mg/dL (ref <200)
HDL: 83 mg/dL (ref >50)
LDL Cholesterol: 87 mg/dL (ref <100)
Total CHOL/HDL Ratio: 2.2 Ratio (ref <5.0)
Triglycerides: 60 mg/dL (ref <150)
VLDL: 12 mg/dL (ref <30)

## 2017-02-01 LAB — URIC ACID: Uric Acid, Serum: 9.4 mg/dL — ABNORMAL HIGH (ref 2.5–7.0)

## 2017-02-02 ENCOUNTER — Other Ambulatory Visit: Payer: Self-pay | Admitting: *Deleted

## 2017-02-02 MED ORDER — ALLOPURINOL 100 MG PO TABS: 200.0000 mg | ORAL_TABLET | Freq: Every day | ORAL | 6 refills | Status: DC

## 2017-02-17 ENCOUNTER — Other Ambulatory Visit: Payer: Self-pay | Admitting: Family Medicine

## 2017-04-01 ENCOUNTER — Other Ambulatory Visit: Payer: Self-pay | Admitting: Family Medicine

## 2017-05-08 ENCOUNTER — Encounter: Payer: Self-pay | Admitting: Family Medicine

## 2017-05-16 ENCOUNTER — Other Ambulatory Visit: Payer: Self-pay | Admitting: Family Medicine

## 2017-05-18 ENCOUNTER — Telehealth: Payer: Self-pay | Admitting: Orthopaedic Surgery

## 2017-05-18 MED ORDER — NAPROXEN 500 MG PO TABS: 500.0000 mg | ORAL_TABLET | Freq: Two times a day (BID) | ORAL | 5 refills | Status: DC

## 2017-05-18 NOTE — Telephone Encounter (Signed)
Pharmacy faxed request per Patient - changed to [place] [facility] - request refill on Naproxen 500 MG TABLET / quantity 60 - please advise

## 2017-05-23 ENCOUNTER — Telehealth: Payer: Self-pay | Admitting: *Deleted

## 2017-05-23 NOTE — Telephone Encounter (Signed)
Patient called stating the naproxen needs to go to [facility] in [place], the rx was sent to [facility] in [place]. Patient requested for this to be changed and sent to [facility]

## 2017-05-24 ENCOUNTER — Other Ambulatory Visit: Payer: Self-pay | Admitting: Family Medicine

## 2017-05-24 NOTE — Telephone Encounter (Signed)
Prescription was transferred to [facility]

## 2017-07-07 ENCOUNTER — Other Ambulatory Visit: Payer: Self-pay | Admitting: *Deleted

## 2017-07-07 MED ORDER — ALLOPURINOL 100 MG PO TABS
200.0000 mg | ORAL_TABLET | Freq: Every day | ORAL | 0 refills | Status: DC
Start: 2017-07-07 — End: 2017-09-05

## 2017-08-02 ENCOUNTER — Ambulatory Visit (INDEPENDENT_AMBULATORY_CARE_PROVIDER_SITE_OTHER): Payer: Medicare Other | Admitting: Family Medicine

## 2017-08-02 ENCOUNTER — Encounter: Payer: Self-pay | Admitting: Family Medicine

## 2017-08-02 ENCOUNTER — Other Ambulatory Visit: Payer: Self-pay

## 2017-08-02 VITALS — BP 132/80 | HR 84 | Temp 98.1°F | Resp 16 | Ht 61.0 in | Wt 218.0 lb

## 2017-08-02 DIAGNOSIS — I10 Essential (primary) hypertension: Secondary | ICD-10-CM

## 2017-08-02 DIAGNOSIS — Z0000 Encounter for general adult medical examination without abnormal findings: Secondary | ICD-10-CM

## 2017-08-02 DIAGNOSIS — R202 Paresthesia of skin: Secondary | ICD-10-CM

## 2017-08-02 DIAGNOSIS — K219 Gastro-esophageal reflux disease without esophagitis: Secondary | ICD-10-CM | POA: Diagnosis not present

## 2017-08-02 DIAGNOSIS — Z6841 Body Mass Index (BMI) 40.0 and over, adult: Secondary | ICD-10-CM | POA: Diagnosis not present

## 2017-08-02 DIAGNOSIS — N6311 Unspecified lump in the right breast, upper outer quadrant: Secondary | ICD-10-CM | POA: Diagnosis not present

## 2017-08-02 DIAGNOSIS — E785 Hyperlipidemia, unspecified: Secondary | ICD-10-CM

## 2017-08-02 DIAGNOSIS — M1A00X0 Idiopathic chronic gout, unspecified site, without tophus (tophi): Secondary | ICD-10-CM | POA: Diagnosis not present

## 2017-08-02 LAB — CBC WITH DIFFERENTIAL/PLATELET
BASOS ABS: 20 {cells}/uL (ref 0–200)
Basophils Relative: 0.4 %
EOS ABS: 88 {cells}/uL (ref 15–500)
Eosinophils Relative: 1.8 %
HCT: 41.8 % (ref 35.0–45.0)
Hemoglobin: 14.1 g/dL (ref 11.7–15.5)
Lymphs Abs: 1793 cells/uL (ref 850–3900)
MCH: 32.9 pg (ref 27.0–33.0)
MCHC: 33.7 g/dL (ref 32.0–36.0)
MCV: 97.7 fL (ref 80.0–100.0)
MONOS PCT: 7.1 %
MPV: 10.7 fL (ref 7.5–12.5)
NEUTROS PCT: 54.1 %
Neutro Abs: 2651 cells/uL (ref 1500–7800)
PLATELETS: 319 10*3/uL (ref 140–400)
RBC: 4.28 10*6/uL (ref 3.80–5.10)
RDW: 10.9 % — AB (ref 11.0–15.0)
TOTAL LYMPHOCYTE: 36.6 %
WBC: 4.9 10*3/uL (ref 3.8–10.8)
WBCMIX: 348 {cells}/uL (ref 200–950)

## 2017-08-02 LAB — COMPREHENSIVE METABOLIC PANEL
AG Ratio: 1 (calc) (ref 1.0–2.5)
ALKALINE PHOSPHATASE (APISO): 76 U/L (ref 33–130)
ALT: 15 U/L (ref 6–29)
AST: 38 U/L — AB (ref 10–35)
Albumin: 3.9 g/dL (ref 3.6–5.1)
BUN / CREAT RATIO: 18 (calc) (ref 6–22)
BUN: 20 mg/dL (ref 7–25)
CHLORIDE: 102 mmol/L (ref 98–110)
CO2: 28 mmol/L (ref 20–32)
CREATININE: 1.09 mg/dL — AB (ref 0.60–0.93)
Calcium: 10.1 mg/dL (ref 8.6–10.4)
GLOBULIN: 3.8 g/dL — AB (ref 1.9–3.7)
GLUCOSE: 86 mg/dL (ref 65–99)
Potassium: 4.1 mmol/L (ref 3.5–5.3)
Sodium: 138 mmol/L (ref 135–146)
Total Bilirubin: 0.7 mg/dL (ref 0.2–1.2)
Total Protein: 7.7 g/dL (ref 6.1–8.1)

## 2017-08-02 LAB — LIPID PANEL
CHOL/HDL RATIO: 2.4 (calc) (ref <5.0)
CHOLESTEROL: 212 mg/dL — AB (ref <200)
HDL: 90 mg/dL (ref >50)
LDL Cholesterol (Calc): 104 mg/dL (calc) — ABNORMAL HIGH
Non-HDL Cholesterol (Calc): 122 mg/dL (calc) (ref <130)
TRIGLYCERIDES: 86 mg/dL (ref <150)

## 2017-08-02 LAB — URIC ACID: Uric Acid, Serum: 6.1 mg/dL (ref 2.5–7.0)

## 2017-08-02 MED ORDER — TRIAMTERENE-HCTZ 37.5-25 MG PO TABS: 1.0000 | ORAL_TABLET | Freq: Every day | ORAL | 3 refills | Status: DC

## 2017-08-02 MED ORDER — ALBUTEROL SULFATE HFA 108 (90 BASE) MCG/ACT IN AERS: 2.0000 | INHALATION_SPRAY | Freq: Four times a day (QID) | RESPIRATORY_TRACT | 1 refills | Status: DC | PRN

## 2017-08-02 MED ORDER — PANTOPRAZOLE SODIUM 40 MG PO TBEC: 40.0000 mg | DELAYED_RELEASE_TABLET | Freq: Every day | ORAL | 3 refills | Status: DC

## 2017-08-02 NOTE — Patient Instructions (Addendum)
Get the xray of your neck  Mammogram to be done We will call with lab results No new medications  F/U 6 months

## 2017-08-02 NOTE — Progress Notes (Signed)
Subjective:   Patient presents for Medicare Annual/Subsequent preventive examination.    Pt here for wellness exam Medications reviewed   Left hand goes numb when she lays on left shoulder, this has been present for quite some some. During the day if she is on her phone it goes numb. Has problems opening jars.  When she wakesup from sleep often is numb     Has not had any difficulty with gout , last URIC acid elevated at 9.4 and allopurinol was increased to 200mg  in June   Noticed a fatty tumor on breast 2-3 weeks ago, interested in mammogram    Had wheezing this morning, ran out of albuterol,still taking her allergy medications flonase, and claritin when needed     Review Past Medical/Family/Social: Per EMR   Risk Factors  Current exercise habits: walks some, continues to work part time prn Dietary issues discussed: Yes  Cardiac risk factors: Obesity (BMI >= 30 kg/m2). HTN  Depression Screen  (Note: if answer to either of the following is "Yes", a more complete depression screening is indicated)  Over the past two weeks, have you felt down, depressed or hopeless? No Over the past two weeks, have you felt little interest or pleasure in doing things? No Have you lost interest or pleasure in daily life? No Do you often feel hopeless? No Do you cry easily over simple problems? No   Activities of Daily Living  In your present state of health, do you have any difficulty performing the following activities?:  Driving? No  Managing money? No  Feeding yourself? No  Getting from bed to chair? No  Climbing a flight of stairs? No  Preparing food and eating?: No  Bathing or showering? No  Getting dressed: No  Getting to the toilet? No  Using the toilet:No  Moving around from place to place: No  In the past year have you fallen or had a near fall?:No  Are you sexually active? No  Do you have more than one partner? No   Hearing Difficulties: No  Do you often ask people to speak  up or repeat themselves? No  Do you experience ringing or noises in your ears? No Do you have difficulty understanding soft or whispered voices? No  Do you feel that you have a problem with memory? No Do you often misplace items? No  Do you feel safe at home? Yes  Cognitive Testing  Alert? Yes Normal Appearance?Yes  Oriented to person? Yes Place? Yes  Time? Yes  Recall of three objects? Yes  Can perform simple calculations? Yes  Displays appropriate judgment?Yes  Can read the correct time from a watch face?Yes   List the Names of Other Physician/Practitioners you currently use:  Orthopedics- Dr. Luna Glasgow Podiatry- Dr. Dillard Essex any recent Medical Services you may have received from other than [facility] providers in the past year (date may be approximate).   Screening Tests / Date DECLINES ALL OTHER, REASONABLE FOR COLONOSCOPY Colonoscopy          Declines            Zostavax  Declines  Mammogram - to be schedule due to lump palpated  Pnemonia- UTD Influenza Vaccine  UTD Tetanus/tdap declines   ROS: GEN- denies fatigue, fever, weight loss,weakness, recent illness HEENT- denies eye drainage, change in vision, nasal discharge, CVS- denies chest pain, palpitations RESP- denies SOB, cough, wheeze ABD- denies N/V, change in stools, abd pain GU- denies dysuria, hematuria, dribbling, incontinence MSK- +  joint pain, muscle aches, injury Neuro- denies headache, dizziness, syncope, seizure activity  PHYSICAL: GEN- NAD, alert and oriented x3 HEENT- PERRL, EOMI, non injected sclera, pink conjunctiva, MMM, oropharynx clear Neck- Supple, no thryomegaly, no bruit  CVS- RRR, no murmur RESP-CTAB Breast- normal symmetry, no nipple inversion,no nipple drainage, + right breast 11 clock  lumps felt Nodes- no axillary nodes ABD-NABS,soft,NT,ND MSK- Fair ROM upper, neg spurlings , rotator cuff in tact, biceps in tact, strength decreased LUE compared to right  Neuro- normal tone, DTR  symmetric, neg phalens, neg tinels , normal monofilament Upper ext EXT- No edema Pulses- Radial, DP- 2+    Assessment:    Annual wellness medicare exam   Plan:    During the course of the visit the patient was educated and counseled about appropriate screening and preventive services including:   Depresson screen negative  HTN- controlled no changes to medications, fasting labs to be done   Breast lump- Mammogram to be done with ultrasound  Paresthesia- most likely from Cervical region, obtan xray of neg, considered carpal tunnel but could not reproduce any symptoms. Pending on xray may need nerve conduction done Discussed gabapentin she wishes to hold off which is reasonale  Prevention- per above, declines immunizations except has had flu and PNA  Dicussed advanced directives- given handout        Diet review for nutrition referral? Yes ____ Not Indicated __x__  Patient Instructions (the written plan) was given to the patient.  Medicare Attestation  I have personally reviewed:  The patient's medical and social history  Their use of alcohol, tobacco or illicit drugs  Their current medications and supplements  The patient's functional ability including ADLs,fall risks, home safety risks, cognitive, and hearing and visual impairment  Diet and physical activities  Evidence for depression or mood disorders  The patient's weight, height, BMI, and visual acuity have been recorded in the chart. I have made referrals, counseling, and provided education to the patient based on review of the above and I have provided the patient with a written personalized care plan for preventive services.

## 2017-08-04 ENCOUNTER — Other Ambulatory Visit: Payer: Self-pay | Admitting: Family Medicine

## 2017-08-04 ENCOUNTER — Encounter: Payer: Self-pay | Admitting: *Deleted

## 2017-08-04 DIAGNOSIS — N6311 Unspecified lump in the right breast, upper outer quadrant: Secondary | ICD-10-CM

## 2017-08-15 ENCOUNTER — Ambulatory Visit (HOSPITAL_COMMUNITY)
Admission: RE | Admit: 2017-08-15 | Discharge: 2017-08-15 | Disposition: A | Discharge status: Home / Self Care | Payer: Medicare Other | Source: Ambulatory Visit | Attending: Family Medicine | Admitting: Family Medicine

## 2017-08-15 ENCOUNTER — Other Ambulatory Visit: Payer: Self-pay | Admitting: Family Medicine

## 2017-08-15 DIAGNOSIS — M1288 Other specific arthropathies, not elsewhere classified, other specified site: Secondary | ICD-10-CM | POA: Insufficient documentation

## 2017-08-15 DIAGNOSIS — R928 Other abnormal and inconclusive findings on diagnostic imaging of breast: Secondary | ICD-10-CM | POA: Diagnosis not present

## 2017-08-15 DIAGNOSIS — R202 Paresthesia of skin: Secondary | ICD-10-CM

## 2017-08-15 DIAGNOSIS — N6311 Unspecified lump in the right breast, upper outer quadrant: Secondary | ICD-10-CM | POA: Insufficient documentation

## 2017-08-15 DIAGNOSIS — N6489 Other specified disorders of breast: Secondary | ICD-10-CM | POA: Diagnosis not present

## 2017-08-15 DIAGNOSIS — M4802 Spinal stenosis, cervical region: Secondary | ICD-10-CM | POA: Diagnosis not present

## 2017-08-24 ENCOUNTER — Other Ambulatory Visit: Payer: Self-pay | Admitting: *Deleted

## 2017-08-24 DIAGNOSIS — G542 Cervical root disorders, not elsewhere classified: Secondary | ICD-10-CM

## 2017-08-24 DIAGNOSIS — M5030 Other cervical disc degeneration, unspecified cervical region: Secondary | ICD-10-CM

## 2017-08-25 ENCOUNTER — Other Ambulatory Visit: Payer: Self-pay | Admitting: *Deleted

## 2017-08-25 MED ORDER — FLUTICASONE PROPIONATE 50 MCG/ACT NA SUSP: 2.0000 | Freq: Every day | NASAL | 6 refills | Status: AC

## 2017-09-04 ENCOUNTER — Encounter: Payer: Self-pay | Admitting: Orthopedic Surgery

## 2017-09-05 ENCOUNTER — Other Ambulatory Visit: Payer: Self-pay | Admitting: *Deleted

## 2017-09-05 MED ORDER — ALLOPURINOL 100 MG PO TABS: 200.0000 mg | ORAL_TABLET | Freq: Every day | ORAL | 1 refills | Status: DC

## 2017-09-13 ENCOUNTER — Encounter: Payer: Self-pay | Admitting: Orthopaedic Surgery

## 2017-09-13 ENCOUNTER — Ambulatory Visit (INDEPENDENT_AMBULATORY_CARE_PROVIDER_SITE_OTHER): Payer: Medicare Other | Admitting: Orthopaedic Surgery

## 2017-09-13 VITALS — BP 165/95 | HR 92 | Ht 61.0 in | Wt 221.0 lb

## 2017-09-13 DIAGNOSIS — M542 Cervicalgia: Secondary | ICD-10-CM

## 2017-09-13 DIAGNOSIS — M5441 Lumbago with sciatica, right side: Secondary | ICD-10-CM | POA: Diagnosis not present

## 2017-09-13 DIAGNOSIS — G8929 Other chronic pain: Secondary | ICD-10-CM | POA: Diagnosis not present

## 2017-09-13 NOTE — Progress Notes (Signed)
Patient [name], female DOB:May 13, 1941, 77 y.o. ONG:[id number]  Chief Complaint  Patient presents with  . New Problem    DDD and cervical nerve impingement    HPI  [name] is a 77 y.o. female who has neck pain and right sided lower back pain.  The neck pain has been present several months.  She had x-rays of the neck on 08-15-17 showing multilevel degenerative changes.  She has paresthesias and numbness of the left index and thumb area.  She has no trauma. She has been on Naprosyn, ice, rest, rubs with slight help.  She has seen Dr. Buelah Manis her family doctor.  She also has had more recently lower back pain on the right side with some right sided sciatica symptoms.  She has no trauma,no weakness.  This comes and goes.  Her neck pain is much worse.  HPI  Body mass index is 41.76 kg/m.  ROS  Review of Systems  HENT: Negative for congestion.   Respiratory: Negative for cough and shortness of breath.   Cardiovascular: Negative for chest pain.  Endocrine: Positive for cold intolerance.  Musculoskeletal: Positive for arthralgias, gait problem and joint swelling.  Allergic/Immunologic: Positive for environmental allergies.  All other systems reviewed and are negative.   Past Medical History:  Diagnosis Date  . Acid reflux   . Arthritis   . Gout   . Gout   . Hypertension   . Shortness of breath     Past Surgical History:  Procedure Laterality Date  . MASS EXCISION N/A 04/11/2014   Procedure: EXCISION SOFT TISSUE NEOPLASM,  BACK;  Surgeon: Jamesetta So, MD;  Location: [facility];  Service: General;  Laterality: N/A;  . TONSILLECTOMY    . TUBAL LIGATION      Family History  Problem Relation Age of Onset  . Cancer Mother   . Diabetes Father     Social History Social History   Tobacco Use  . Smoking status: Never Smoker  . Smokeless tobacco: Never Used  Substance Use Topics  . Alcohol use: No  . Drug use: No    No Known Allergies  Current  Outpatient Medications  Medication Sig Dispense Refill  . albuterol (PROVENTIL HFA;VENTOLIN HFA) 108 (90 Base) MCG/ACT inhaler Inhale 2 puffs into the lungs every 6 (six) hours as needed for wheezing or shortness of breath. 1 Inhaler 1  . allopurinol (ZYLOPRIM) 100 MG tablet Take 2 tablets (200 mg total) by mouth daily. 180 tablet 1  . aspirin EC 81 MG tablet Take 81 mg by mouth daily.      . Calcium Carbonate-Vitamin D (CALCIUM-D) 600-400 MG-UNIT TABS Take by mouth 2 (two) times daily.    . diclofenac sodium (VOLTAREN) 1 % GEL Apply 2 g topically 4 (four) times daily. Pain 100 g 3  . fluticasone (FLONASE) 50 MCG/ACT nasal spray Place 2 sprays into both nostrils daily. 16 g 6  . loratadine (CLARITIN) 10 MG tablet Take 1 tablet (10 mg total) by mouth daily as needed for allergies. 30 tablet 11  . naproxen (NAPROSYN) 500 MG tablet Take 1 tablet (500 mg total) by mouth 2 (two) times daily with a meal. 60 tablet 5  . pantoprazole (PROTONIX) 40 MG tablet Take 1 tablet (40 mg total) by mouth daily. 90 tablet 3  . triamterene-hydrochlorothiazide (MAXZIDE-25) 37.5-25 MG tablet Take 1 tablet by mouth daily. 90 tablet 3   No current facility-administered medications for this visit.      Physical Exam  Blood pressure (!) 165/95, pulse 92, height 5\' 1"  (1.549 m), weight 221 lb (100.2 kg).  Constitutional: overall normal hygiene, normal nutrition, well developed, normal grooming, normal body habitus. Assistive device:none  Musculoskeletal: gait and station Limp none, muscle tone and strength are normal, no tremors or atrophy is present.  .  Neurological: coordination overall normal.  Deep tendon reflex/nerve stretch intact.  Sensation normal.  Cranial nerves II-XII intact.   Skin:   Normal overall no scars, lesions, ulcers or rashes. No psoriasis.  Psychiatric: Alert and oriented x 3.  Recent memory intact, remote memory unclear.  Normal mood and affect. Well groomed.  Good eye  contact.  Cardiovascular: overall no swelling, no varicosities, no edema bilaterally, normal temperatures of the legs and arms, no clubbing, cyanosis and good capillary refill.  Lymphatic: palpation is normal.  Neck has full ROM.  She has no spasm.  NV intact.  Spine/Pelvis examination:  Inspection:  Overall, sacoiliac joint benign and hips nontender; without crepitus or defects.   Thoracic spine inspection: Alignment normal without kyphosis present   Lumbar spine inspection:  Alignment  with normal lumbar lordosis, without scoliosis apparent.   Thoracic spine palpation:  without tenderness of spinal processes   Lumbar spine palpation: without tenderness of lumbar area; without tightness of lumbar muscles    Range of Motion:   Lumbar flexion, forward flexion is normal without pain or tenderness    Lumbar extension is full without pain or tenderness   Left lateral bend is normal without pain or tenderness   Right lateral bend is normal without pain or tenderness   Straight leg raising is normal  Strength & tone: normal   Stability overall normal stability  All other systems reviewed and are negative   The patient has been educated about the nature of the problem(s) and counseled on treatment options.  The patient appeared to understand what I have discussed and is in agreement with it.  Encounter Diagnoses  Name Primary?  . Cervicalgia   . Neck pain Yes  . Chronic right-sided low back pain with right-sided sciatica     PLAN Call if any problems.  Precautions discussed.  Continue current medications.   Return to clinic After MRI of the neck.   I am concerned about a HNP in the neck.  She will get the MRI.  She is to continue her Naprosyn.  Electronically Signed Sanjuana Kava, MD 1/16/20191:51 PM

## 2017-09-19 ENCOUNTER — Ambulatory Visit (HOSPITAL_COMMUNITY)
Admission: RE | Admit: 2017-09-19 | Discharge: 2017-09-19 | Disposition: A | Discharge status: Home / Self Care | Payer: Medicare Other | Source: Ambulatory Visit | Attending: Orthopaedic Surgery | Admitting: Orthopaedic Surgery

## 2017-09-19 DIAGNOSIS — M4802 Spinal stenosis, cervical region: Secondary | ICD-10-CM | POA: Diagnosis not present

## 2017-09-19 DIAGNOSIS — M4302 Spondylolysis, cervical region: Secondary | ICD-10-CM | POA: Insufficient documentation

## 2017-09-19 DIAGNOSIS — M542 Cervicalgia: Secondary | ICD-10-CM

## 2017-09-21 ENCOUNTER — Ambulatory Visit: Payer: Medicare Other | Admitting: Orthopaedic Surgery

## 2017-09-21 ENCOUNTER — Encounter: Payer: Self-pay | Admitting: Orthopaedic Surgery

## 2017-09-21 VITALS — BP 142/85 | HR 85 | Ht 61.0 in | Wt 220.0 lb

## 2017-09-21 DIAGNOSIS — M542 Cervicalgia: Secondary | ICD-10-CM

## 2017-09-21 NOTE — Progress Notes (Signed)
re

## 2017-09-21 NOTE — Progress Notes (Signed)
Patient DX:IPJASN L Dirks, female DOB:October 20, 1940, 77 y.o. KNL:[id number]  Chief Complaint  Patient presents with  . Results    Cervical MRI    HPI  [name] is a 77 y.o. female who has neck pain and left sided paresthesias.  She had MRI of the neck and it showed: IMPRESSION: 1. 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 2. Multifactorial degenerative changes with resultant multilevel foraminal narrowing as above. Notable findings include moderate right C3 and bilateral C4 foraminal stenosis, with severe right and moderate left C5 and C6 foraminal narrowing. 3. Multilevel facet degeneration as above, most notable at C5-6 on the right. Findings could serve as a source for neck pain.  I explained the findings.  I will have a neurosurgeon see her for further evaluation.  She is agreeable to this.  She continues to have pain and the paresthesias. HPI  Body mass index is 41.57 kg/m.  ROS  Review of Systems  HENT: Negative for congestion.   Respiratory: Negative for cough and shortness of breath.   Cardiovascular: Negative for chest pain.  Endocrine: Positive for cold intolerance.  Musculoskeletal: Positive for arthralgias, gait problem and joint swelling.  Allergic/Immunologic: Positive for environmental allergies.  All other systems reviewed and are negative.   Past Medical History:  Diagnosis Date  . Acid reflux   . Arthritis   . Gout   . Gout   . Hypertension   . Shortness of breath     Past Surgical History:  Procedure Laterality Date  . MASS EXCISION N/A 04/11/2014   Procedure: EXCISION SOFT TISSUE NEOPLASM,  BACK;  Surgeon: Jamesetta So, MD;  Location: [facility];  Service: General;  Laterality: N/A;  . TONSILLECTOMY    . TUBAL LIGATION      Family History  Problem Relation Age of Onset  . Cancer Mother   . Diabetes Father     Social History Social History   Tobacco Use  . Smoking  status: Never Smoker  . Smokeless tobacco: Never Used  Substance Use Topics  . Alcohol use: No  . Drug use: No    No Known Allergies  Current Outpatient Medications  Medication Sig Dispense Refill  . albuterol (PROVENTIL HFA;VENTOLIN HFA) 108 (90 Base) MCG/ACT inhaler Inhale 2 puffs into the lungs every 6 (six) hours as needed for wheezing or shortness of breath. 1 Inhaler 1  . allopurinol (ZYLOPRIM) 100 MG tablet Take 2 tablets (200 mg total) by mouth daily. 180 tablet 1  . aspirin EC 81 MG tablet Take 81 mg by mouth daily.      . Calcium Carbonate-Vitamin D (CALCIUM-D) 600-400 MG-UNIT TABS Take by mouth 2 (two) times daily.    . diclofenac sodium (VOLTAREN) 1 % GEL Apply 2 g topically 4 (four) times daily. Pain 100 g 3  . fluticasone (FLONASE) 50 MCG/ACT nasal spray Place 2 sprays into both nostrils daily. 16 g 6  . loratadine (CLARITIN) 10 MG tablet Take 1 tablet (10 mg total) by mouth daily as needed for allergies. 30 tablet 11  . naproxen (NAPROSYN) 500 MG tablet Take 1 tablet (500 mg total) by mouth 2 (two) times daily with a meal. 60 tablet 5  . pantoprazole (PROTONIX) 40 MG tablet Take 1 tablet (40 mg total) by mouth daily. 90 tablet 3  . triamterene-hydrochlorothiazide (MAXZIDE-25) 37.5-25 MG tablet Take 1 tablet by mouth daily. 90 tablet 3   No current facility-administered medications for this visit.  Physical Exam  Blood pressure (!) 142/85, pulse 85, height 5\' 1"  (1.549 m), weight 220 lb (99.8 kg).  Constitutional: overall normal hygiene, normal nutrition, well developed, normal grooming, normal body habitus. Assistive device:none  Musculoskeletal: gait and station Limp none, muscle tone and strength are normal, no tremors or atrophy is present.  .  Neurological: coordination overall normal.  Deep tendon reflex/nerve stretch intact.  Sensation normal.  Cranial nerves II-XII intact.   Skin:   Normal overall no scars, lesions, ulcers or rashes. No  psoriasis.  Psychiatric: Alert and oriented x 3.  Recent memory intact, remote memory unclear.  Normal mood and affect. Well groomed.  Good eye contact.  Cardiovascular: overall no swelling, no varicosities, no edema bilaterally, normal temperatures of the legs and arms, no clubbing, cyanosis and good capillary refill.  Lymphatic: palpation is normal.  All other systems reviewed and are negative   Neck motion is good but she has tenderness and slight decreased sensation of dorsal index and thumb on the left but muscle strength is good.  The patient has been educated about the nature of the problem(s) and counseled on treatment options.  The patient appeared to understand what I have discussed and is in agreement with it.  Encounter Diagnosis  Name Primary?  . Cervicalgia Yes    PLAN Call if any problems.  Precautions discussed.  Continue current medications.   Return to clinic to see neurosurgeon   Electronically Signed Sanjuana Kava, MD 1/24/20199:44 AM

## 2017-10-30 DIAGNOSIS — M4722 Other spondylosis with radiculopathy, cervical region: Secondary | ICD-10-CM | POA: Diagnosis not present

## 2017-10-30 DIAGNOSIS — Z6834 Body mass index (BMI) 34.0-34.9, adult: Secondary | ICD-10-CM | POA: Diagnosis not present

## 2017-10-30 DIAGNOSIS — I10 Essential (primary) hypertension: Secondary | ICD-10-CM | POA: Diagnosis not present

## 2017-11-02 DIAGNOSIS — M4722 Other spondylosis with radiculopathy, cervical region: Secondary | ICD-10-CM | POA: Diagnosis not present

## 2017-11-06 DIAGNOSIS — M4722 Other spondylosis with radiculopathy, cervical region: Secondary | ICD-10-CM | POA: Diagnosis not present

## 2017-11-08 DIAGNOSIS — M4722 Other spondylosis with radiculopathy, cervical region: Secondary | ICD-10-CM | POA: Diagnosis not present

## 2017-11-14 DIAGNOSIS — M4722 Other spondylosis with radiculopathy, cervical region: Secondary | ICD-10-CM | POA: Diagnosis not present

## 2017-11-15 DIAGNOSIS — M4722 Other spondylosis with radiculopathy, cervical region: Secondary | ICD-10-CM | POA: Diagnosis not present

## 2017-11-22 DIAGNOSIS — M4722 Other spondylosis with radiculopathy, cervical region: Secondary | ICD-10-CM | POA: Diagnosis not present

## 2017-12-07 DIAGNOSIS — I10 Essential (primary) hypertension: Secondary | ICD-10-CM | POA: Diagnosis not present

## 2017-12-07 DIAGNOSIS — Z6834 Body mass index (BMI) 34.0-34.9, adult: Secondary | ICD-10-CM | POA: Diagnosis not present

## 2017-12-07 DIAGNOSIS — M4722 Other spondylosis with radiculopathy, cervical region: Secondary | ICD-10-CM | POA: Diagnosis not present

## 2017-12-22 DIAGNOSIS — H04123 Dry eye syndrome of bilateral lacrimal glands: Secondary | ICD-10-CM | POA: Diagnosis not present

## 2017-12-22 DIAGNOSIS — H40033 Anatomical narrow angle, bilateral: Secondary | ICD-10-CM | POA: Diagnosis not present

## 2018-02-02 ENCOUNTER — Ambulatory Visit: Payer: Medicare Other | Admitting: Family Medicine

## 2018-02-13 ENCOUNTER — Other Ambulatory Visit: Payer: Self-pay

## 2018-02-13 ENCOUNTER — Encounter: Payer: Self-pay | Admitting: Family Medicine

## 2018-02-13 ENCOUNTER — Ambulatory Visit (INDEPENDENT_AMBULATORY_CARE_PROVIDER_SITE_OTHER): Payer: Medicare Other | Admitting: Family Medicine

## 2018-02-13 VITALS — BP 138/72 | HR 70 | Temp 98.4°F | Resp 12 | Ht 61.0 in | Wt 213.0 lb

## 2018-02-13 DIAGNOSIS — I10 Essential (primary) hypertension: Secondary | ICD-10-CM | POA: Diagnosis not present

## 2018-02-13 DIAGNOSIS — M1A00X0 Idiopathic chronic gout, unspecified site, without tophus (tophi): Secondary | ICD-10-CM

## 2018-02-13 DIAGNOSIS — Z6841 Body Mass Index (BMI) 40.0 and over, adult: Secondary | ICD-10-CM | POA: Diagnosis not present

## 2018-02-13 DIAGNOSIS — K219 Gastro-esophageal reflux disease without esophagitis: Secondary | ICD-10-CM

## 2018-02-13 DIAGNOSIS — E785 Hyperlipidemia, unspecified: Secondary | ICD-10-CM | POA: Diagnosis not present

## 2018-02-13 MED ORDER — TRIAMTERENE-HCTZ 37.5-25 MG PO TABS: 1.0000 | ORAL_TABLET | Freq: Every day | ORAL | 3 refills | Status: DC

## 2018-02-13 MED ORDER — ALLOPURINOL 100 MG PO TABS: 200.0000 mg | ORAL_TABLET | Freq: Every day | ORAL | 2 refills | Status: DC

## 2018-02-13 MED ORDER — ALBUTEROL SULFATE HFA 108 (90 BASE) MCG/ACT IN AERS: 2.0000 | INHALATION_SPRAY | Freq: Four times a day (QID) | RESPIRATORY_TRACT | 1 refills | Status: DC | PRN

## 2018-02-13 MED ORDER — PANTOPRAZOLE SODIUM 40 MG PO TBEC: 40.0000 mg | DELAYED_RELEASE_TABLET | Freq: Every day | ORAL | 3 refills | Status: DC

## 2018-02-13 NOTE — Progress Notes (Signed)
   Subjective:    Patient ID: [name], female    DOB: [birth year], 77 y.o.   MRN: [id number]  Patient presents for Follow-up (is fasting)   Pt here to f/u chronic medical problems. Medications reviewed   HTN- taking BP meds as prescribed, no concerns   Hyperlipidemia- no current meds trying to control with diet, TC  212 LDL 104 in December at [facility]  Gout- controlled with allopurinol    Weight down 7lbs since Jan, has cut down on sodas, eating more fruits and vegetables    Seen by neurosurgery for her neck pain, had PT in [place], no new meds given, completed in April    Taking protonix daily, keeps symptoms controlled   Review Of Systems:  GEN- denies fatigue, fever, weight loss,weakness, recent illness HEENT- denies eye drainage, change in vision, nasal discharge, CVS- denies chest pain, palpitations RESP- denies SOB, cough, wheeze ABD- denies N/V, change in stools, abd pain GU- denies dysuria, hematuria, dribbling, incontinence MSK- denies joint pain, muscle aches, injury Neuro- denies headache, dizziness, syncope, seizure activity       Objective:    BP 138/72   Pulse 70   Temp 98.4 F (36.9 C) (Oral)   Resp 12   Ht 5\' 1"  (1.549 m)   Wt 213 lb (96.6 kg)   SpO2 99%   BMI 40.25 kg/m  GEN- NAD, alert and oriented x3 HEENT- PERRL, EOMI, non injected sclera, pink conjunctiva, MMM, oropharynx clear Neck- Supple, no thyromegaly CVS- RRR, no murmur RESP-CTAB ABD-NABS,soft,NT,ND EXT- No edema Pulses- Radial, DP- 2+        Assessment & Plan:      Problem List Items Addressed This Visit      Unprioritized   Essential hypertension, benign - Primary    Well controlled no changes       Relevant Medications   triamterene-hydrochlorothiazide (MAXZIDE-25) 37.5-25 MG tablet   Other Relevant Orders   Comprehensive metabolic panel   GERD (gastroesophageal reflux disease)    Continue protonix      Relevant Medications   pantoprazole (PROTONIX) 40 MG  tablet   Gout    Doing well on allopurnol, no flares       Mild hyperlipidemia    Recheck lipids and LFT      Relevant Medications   triamterene-hydrochlorothiazide (MAXZIDE-25) 37.5-25 MG tablet   Other Relevant Orders   Comprehensive metabolic panel   Lipid panel   Obesity    Discussed staying active, try [employer]  She has cut out soda, increased veggies, made some changes for her weight Will continue to monitor         Note: This dictation was prepared with Dragon dictation along with smaller phrase technology. Any transcriptional errors that result from this process are unintentional.

## 2018-02-13 NOTE — Assessment & Plan Note (Signed)
Discussed staying active, try [employer]  She has cut out soda, increased veggies, made some changes for her weight Will continue to monitor

## 2018-02-13 NOTE — Assessment & Plan Note (Signed)
Doing well on allopurnol, no flares

## 2018-02-13 NOTE — Patient Instructions (Addendum)
F/U 6 MONTHS FOR PHYSICAL  Start exercise program

## 2018-02-13 NOTE — Assessment & Plan Note (Signed)
Recheck lipids and LFT 

## 2018-02-13 NOTE — Assessment & Plan Note (Signed)
Well controlled no changes 

## 2018-02-13 NOTE — Assessment & Plan Note (Signed)
Continue protonix  

## 2018-02-14 LAB — LIPID PANEL
Cholesterol: 192 mg/dL (ref <200)
HDL: 75 mg/dL (ref >50)
LDL Cholesterol (Calc): 102 mg/dL (calc) — ABNORMAL HIGH
NON-HDL CHOLESTEROL (CALC): 117 mg/dL (ref <130)
Total CHOL/HDL Ratio: 2.6 (calc) (ref <5.0)
Triglycerides: 60 mg/dL (ref <150)

## 2018-02-14 LAB — COMPREHENSIVE METABOLIC PANEL
AG RATIO: 1.1 (calc) (ref 1.0–2.5)
ALT: 13 U/L (ref 6–29)
AST: 35 U/L (ref 10–35)
Albumin: 3.7 g/dL (ref 3.6–5.1)
Alkaline phosphatase (APISO): 75 U/L (ref 33–130)
BILIRUBIN TOTAL: 0.6 mg/dL (ref 0.2–1.2)
BUN / CREAT RATIO: 22 (calc) (ref 6–22)
BUN: 21 mg/dL (ref 7–25)
CALCIUM: 10 mg/dL (ref 8.6–10.4)
CHLORIDE: 105 mmol/L (ref 98–110)
CO2: 27 mmol/L (ref 20–32)
Creat: 0.96 mg/dL — ABNORMAL HIGH (ref 0.60–0.93)
GLOBULIN: 3.5 g/dL (ref 1.9–3.7)
GLUCOSE: 91 mg/dL (ref 65–99)
POTASSIUM: 4.3 mmol/L (ref 3.5–5.3)
Sodium: 141 mmol/L (ref 135–146)
TOTAL PROTEIN: 7.2 g/dL (ref 6.1–8.1)

## 2018-02-14 LAB — SPECIMEN COMPROMISED

## 2018-02-14 LAB — EXTRA LAV TOP TUBE

## 2018-02-15 ENCOUNTER — Encounter: Payer: Self-pay | Admitting: *Deleted

## 2018-03-12 ENCOUNTER — Other Ambulatory Visit: Payer: Self-pay

## 2018-03-12 ENCOUNTER — Encounter: Payer: Self-pay | Admitting: Family Medicine

## 2018-03-12 ENCOUNTER — Ambulatory Visit (INDEPENDENT_AMBULATORY_CARE_PROVIDER_SITE_OTHER): Payer: Medicare Other | Admitting: Family Medicine

## 2018-03-12 VITALS — BP 138/78 | HR 82 | Temp 98.1°F | Resp 16 | Ht 61.0 in | Wt 217.0 lb

## 2018-03-12 DIAGNOSIS — J209 Acute bronchitis, unspecified: Secondary | ICD-10-CM | POA: Diagnosis not present

## 2018-03-12 MED ORDER — BENZONATATE 100 MG PO CAPS: 100.0000 mg | ORAL_CAPSULE | Freq: Three times a day (TID) | ORAL | 0 refills | Status: DC | PRN

## 2018-03-12 MED ORDER — PREDNISONE 20 MG PO TABS: 40.0000 mg | ORAL_TABLET | Freq: Every day | ORAL | 0 refills | Status: DC

## 2018-03-12 NOTE — Progress Notes (Signed)
   Subjective:    Patient ID: [name], female    DOB: [birth year], 77 y.o.   MRN: [id number]  Patient presents for Cough (x2 weeks- nonproductive cough, chest congestion)   Dry cough for past 2 weeks, will cough so heard gets nausea, gets nasal congestion at night, cough is all day. No fever.  Has had some wheezing , no SOB, no chest pain.   No sinus drainage  Used albuterol last yesterday morning.  No Diarrea or vomiting   Works at [employer], had a patient with PNA  Has not been taking claritin        Review Of Systems:  GEN- denies fatigue, fever, weight loss,weakness, recent illness HEENT- denies eye drainage, change in vision, nasal discharge, CVS- denies chest pain, palpitations RESP- denies SOB, +cough,+ wheeze ABD- denies N/V, change in stools, abd pain GU- denies dysuria, hematuria, dribbling, incontinence MSK- denies joint pain, muscle aches, injury Neuro- denies headache, dizziness, syncope, seizure activity       Objective:    BP 138/78   Pulse 82   Temp 98.1 F (36.7 C) (Oral)   Resp 16   Ht 5\' 1"  (1.549 m)   Wt 217 lb (98.4 kg)   SpO2 97%   BMI 41.00 kg/m  GEN- NAD, alert and oriented x3 HEENT- PERRL, EOMI, non injected sclera, pink conjunctiva, MMM, oropharynx mild injection, TM clear bilat no effusion,  No  maxillary sinus tenderness, inflammed turbinates,  Nasal drainage  Neck- Supple, no LAD CVS- RRR, no murmur RESP-CTAB EXT- No edema Pulses- Radial 2+          Assessment & Plan:      Problem List Items Addressed This Visit    None    Visit Diagnoses    Acute bronchitis, unspecified organism    -  Primary   Has some nasal congestion as well, use claritin, tessalon perrles, prednisone prn albuterol, no red flags       Note: This dictation was prepared with Dragon dictation along with smaller phrase technology. Any transcriptional errors that result from this process are unintentional.

## 2018-03-12 NOTE — Patient Instructions (Signed)
Take prednisone Use albuterol  Tessalon perrles Take allergy medicine F/U as needed

## 2018-06-08 ENCOUNTER — Ambulatory Visit (INDEPENDENT_AMBULATORY_CARE_PROVIDER_SITE_OTHER): Payer: Medicare Other | Admitting: Family Medicine

## 2018-06-08 ENCOUNTER — Encounter: Payer: Self-pay | Admitting: Family Medicine

## 2018-06-08 ENCOUNTER — Other Ambulatory Visit: Payer: Self-pay

## 2018-06-08 VITALS — BP 132/70 | HR 78 | Temp 98.8°F | Resp 16 | Ht 61.0 in | Wt 213.0 lb

## 2018-06-08 DIAGNOSIS — J309 Allergic rhinitis, unspecified: Secondary | ICD-10-CM | POA: Diagnosis not present

## 2018-06-08 DIAGNOSIS — J209 Acute bronchitis, unspecified: Secondary | ICD-10-CM | POA: Diagnosis not present

## 2018-06-08 MED ORDER — PREDNISONE 20 MG PO TABS: 40.0000 mg | ORAL_TABLET | Freq: Every day | ORAL | 0 refills | Status: AC

## 2018-06-08 MED ORDER — MONTELUKAST SODIUM 10 MG PO TABS: 10.0000 mg | ORAL_TABLET | Freq: Every day | ORAL | 3 refills | Status: DC

## 2018-06-08 MED ORDER — BENZONATATE 100 MG PO CAPS: 100.0000 mg | ORAL_CAPSULE | Freq: Three times a day (TID) | ORAL | 0 refills | Status: DC | PRN

## 2018-06-08 NOTE — Patient Instructions (Signed)
For your nasal allergies, keep doing claritin and flonase, add montelukast to that to help control allergies.  Get a saline rinse and use often to help rinse out and clear out sinuses and nasal congestion.   If sneezing and itching is severe you can take an additional benadryl as needed.  Use the tessalon for coughing, continue to use your inhaler as needed, and also get mucinex   If coughing gets worse start the steroids.  If you have a new fever call us and please tell us your symptoms.

## 2018-06-08 NOTE — Progress Notes (Signed)
Patient ID: [name], female    DOB: Jan 22, 1941, 77 y.o.   MRN: [id number]  PCP: Alycia Rossetti, MD  Chief Complaint  Patient presents with  . Illness    x1 weeks- nonproductive cough, sneezing    Subjective:   [name] is a 77 y.o. female, presents to clinic with CC of nonproductive cough and sneezing for 1 week that is gradually worsening.  She has been using inhaler which helps a little bit, has also started Claritin.  She reports severe sneezing with nasal drainage, post nasal drip, dry cough, using inhaler helps with cough but she continues to feel wheezy. Some sweats at night and a few times with chills during the day, but believes this is after coughing fits which cause her to sweat profusely.  She denies fever, chest pain, hemoptysis, weight loss.  She denies any known lung disease but she has been getting bronchitis a few times a year usually when allergies or cold occurs she will have some lingering congestion tightness cough and wheezing in her chest.  Most recent was July 2019  No known contacts, non-smoker   Patient Active Problem List   Diagnosis Date Noted  . Gout 04/01/2016  . Paresthesia of bilateral legs 11/26/2013  . Vitamin D deficiency 04/14/2013  . Obesity 07/13/2012  . OA (osteoarthritis) of knee 03/13/2012  . Mild hyperlipidemia 12/13/2011  . GERD (gastroesophageal reflux disease) 11/16/2011  . Essential hypertension, benign 11/15/2011     Prior to Admission medications   Medication Sig Start Date End Date Taking? Authorizing Provider  albuterol (PROVENTIL HFA;VENTOLIN HFA) 108 (90 Base) MCG/ACT inhaler Inhale 2 puffs into the lungs every 6 (six) hours as needed for wheezing or shortness of breath. 02/13/18  Yes [place], Modena Nunnery, MD  allopurinol (ZYLOPRIM) 100 MG tablet Take 2 tablets (200 mg total) by mouth daily. 02/13/18  Yes Page, Modena Nunnery, MD  aspirin EC 81 MG tablet Take 81 mg by mouth daily.     Yes [provider]  benzonatate (TESSALON) 100 MG capsule Take 1 capsule (100 mg total) by mouth 3 (three) times daily as needed for cough. 03/12/18  Yes Jewell, Modena Nunnery, MD  Calcium Carbonate-Vitamin D (CALCIUM-D) 600-400 MG-UNIT TABS Take by mouth 2 (two) times daily.   Yes [provider]  diclofenac sodium (VOLTAREN) 1 % GEL Apply 2 g topically 4 (four) times daily. Pain 02/14/14  Yes Terry, Modena Nunnery, MD  fluticasone [facility]) 50 MCG/ACT nasal spray Place 2 sprays into both nostrils daily. 08/25/17  Yes [place], Modena Nunnery, MD  loratadine (CLARITIN) 10 MG tablet Take 1 tablet (10 mg total) by mouth daily as needed for allergies. 11/30/15  Yes [place], Modena Nunnery, MD  naproxen (NAPROSYN) 500 MG tablet Take 1 tablet (500 mg total) by mouth 2 (two) times daily with a meal. 05/18/17  Yes Sanjuana Kava, MD  pantoprazole (PROTONIX) 40 MG tablet Take 1 tablet (40 mg total) by mouth daily. 02/13/18  Yes Singer, Modena Nunnery, MD  triamterene-hydrochlorothiazide (MAXZIDE-25) 37.5-25 MG tablet Take 1 tablet by mouth daily. 02/13/18  Yes [place], Modena Nunnery, MD   No Known Allergies   Family History  Problem Relation Age of Onset  . Cancer Mother   . Diabetes Father     Review of Systems  Constitutional: Negative.   HENT: Negative.   Eyes: Negative.   Respiratory: Negative.   Cardiovascular: Negative.   Gastrointestinal: Negative.   Endocrine: Negative.   Genitourinary: Negative.  Musculoskeletal: Negative.   Skin: Negative.   Allergic/Immunologic: Negative.   Neurological: Negative.   Hematological: Negative.   Psychiatric/Behavioral: Negative.   All other systems reviewed and are negative.     Objective:    Vitals:   06/08/18 0807  BP: 132/70  Pulse: 78  Resp: 16  Temp: 98.8 F (37.1 C)  TempSrc: Oral  SpO2: 98%  Weight: 213 lb (96.6 kg)  Height: 5\' 1"  (1.549 m)      Physical Exam  Constitutional: She is oriented to person, place, and time. She appears well-developed and  well-nourished.  Non-toxic appearance. No distress.  HENT:  Head: Normocephalic and atraumatic.  Right Ear: External ear normal.  Left Ear: External ear normal.  Mouth/Throat: Uvula is midline, oropharynx is clear and moist and mucous membranes are normal. No oropharyngeal exudate.  Nasal turbinates bilaterally enlarged and pale, moderate discharge Posterior oropharynx grossly normal without edema or erythema  Eyes: Pupils are equal, round, and reactive to light. Conjunctivae, EOM and lids are normal. No scleral icterus.  Neck: Normal range of motion and phonation normal. Neck supple. No tracheal deviation present.  Cardiovascular: Normal rate, regular rhythm, normal heart sounds, intact distal pulses and normal pulses. Exam reveals no gallop and no friction rub.  No murmur heard. Pulses:      Radial pulses are 2+ on the right side, and 2+ on the left side.       Posterior tibial pulses are 2+ on the right side, and 2+ on the left side.  Pulmonary/Chest: Effort normal and breath sounds normal. No stridor. No respiratory distress. She has no wheezes. She has no rhonchi. She has no rales. She exhibits no tenderness.  Intermittent coughing fits with deep inspiration but otherwise good effort, no retractions, accessory muscle use, symmetrical chest expansion, no tenderness to chest wall, lungs CTA A&P  Abdominal: Soft. Normal appearance and bowel sounds are normal. She exhibits no distension and no mass. There is no tenderness. There is no rebound and no guarding.  Musculoskeletal: Normal range of motion. She exhibits no edema or deformity.  Lymphadenopathy:    She has no cervical adenopathy.  Neurological: She is alert and oriented to person, place, and time. She exhibits normal muscle tone. Gait normal.  Skin: Skin is warm, dry and intact. Capillary refill takes less than 2 seconds. No rash noted. She is not diaphoretic. No pallor.  Psychiatric: She has a normal mood and affect. Her speech is  normal and behavior is normal.  Nursing note and vitals reviewed.      Assessment & Plan:      ICD-10-CM   1. Allergic rhinitis, unspecified seasonality, unspecified trigger J30.9 montelukast (SINGULAIR) 10 MG tablet    predniSONE (DELTASONE) 20 MG tablet  2. Acute bronchitis, unspecified organism J20.9 benzonatate (TESSALON) 100 MG capsule    predniSONE (DELTASONE) 20 MG tablet    Suspect worsening allergies and acute bronchitis, no history of lung disease and no overly concerning comorbidities, will treat with increase of allergy medications, steroid burst, Tessalon for cough, advised to take Mucinex, over-the-counter analgesics and antipyretics if she needs, continue to use inhaler for any wheeze, follow up if not improving.   Delsa Grana, PA-C 06/13/18 1:40 PM

## 2018-06-13 ENCOUNTER — Encounter: Payer: Self-pay | Admitting: Family Medicine

## 2018-06-19 ENCOUNTER — Other Ambulatory Visit: Payer: Self-pay | Admitting: Orthopaedic Surgery

## 2018-08-13 ENCOUNTER — Other Ambulatory Visit: Payer: Self-pay

## 2018-08-13 ENCOUNTER — Encounter: Payer: Self-pay | Admitting: Family Medicine

## 2018-08-13 ENCOUNTER — Ambulatory Visit (INDEPENDENT_AMBULATORY_CARE_PROVIDER_SITE_OTHER): Payer: Medicare Other | Admitting: Family Medicine

## 2018-08-13 VITALS — BP 138/82 | HR 76 | Temp 98.6°F | Resp 14 | Ht 61.0 in | Wt 214.0 lb

## 2018-08-13 DIAGNOSIS — Z1239 Encounter for other screening for malignant neoplasm of breast: Secondary | ICD-10-CM | POA: Diagnosis not present

## 2018-08-13 DIAGNOSIS — I10 Essential (primary) hypertension: Secondary | ICD-10-CM | POA: Diagnosis not present

## 2018-08-13 DIAGNOSIS — E559 Vitamin D deficiency, unspecified: Secondary | ICD-10-CM

## 2018-08-13 DIAGNOSIS — N951 Menopausal and female climacteric states: Secondary | ICD-10-CM | POA: Diagnosis not present

## 2018-08-13 DIAGNOSIS — Z0000 Encounter for general adult medical examination without abnormal findings: Secondary | ICD-10-CM | POA: Diagnosis not present

## 2018-08-13 DIAGNOSIS — R05 Cough: Secondary | ICD-10-CM

## 2018-08-13 DIAGNOSIS — R0989 Other specified symptoms and signs involving the circulatory and respiratory systems: Secondary | ICD-10-CM | POA: Diagnosis not present

## 2018-08-13 DIAGNOSIS — Z23 Encounter for immunization: Secondary | ICD-10-CM

## 2018-08-13 DIAGNOSIS — R053 Chronic cough: Secondary | ICD-10-CM

## 2018-08-13 DIAGNOSIS — E785 Hyperlipidemia, unspecified: Secondary | ICD-10-CM | POA: Diagnosis not present

## 2018-08-13 NOTE — Patient Instructions (Addendum)
Flu shot given We will call with lab results  Ultrasound of the carotid  Schedule your mammogram /Bone Density - after the carotid ultrasound Review the Advanced directives  Call for the cologuard Chest xray to be done  F/U 6 months

## 2018-08-13 NOTE — Progress Notes (Signed)
Subjective:   Patient presents for Medicare Annual/Subsequent preventive examination.     Still working part-time. She has cough which is intermittant over past few months. Non productive. Last seen in Oct, given singulair and tessalon which has helped. She has had some wheezing, she has used the albuterol recently   HTN- taking maxzide with out difficulty   Gout- rare flares    OA- on naprosyn as needed    , still taking ASA and her calcium and vitamin D     Review Past Medical/Family/Social: per EMR   Risk Factors  Current exercise habits: walks some  Dietary issues discussed:  Yes   Cardiac risk factors: Obesity (BMI >= 30 kg/m2). HTN  Depression Screen  (Note: if answer to either of the following is "Yes", a more complete depression screening is indicated)  Over the past two weeks, have you felt down, depressed or hopeless? No Over the past two weeks, have you felt little interest or pleasure in doing things? No Have you lost interest or pleasure in daily life? No Do you often feel hopeless? No Do you cry easily over simple problems? No   Activities of Daily Living  In your present state of health, do you have any difficulty performing the following activities?:  Driving? No  Managing money? No  Feeding yourself? No  Getting from bed to chair? No  Climbing a flight of stairs? No  Preparing food and eating?: No  Bathing or showering? No  Getting dressed: No  Getting to the toilet? No  Using the toilet:No  Moving around from place to place: No  In the past year have you fallen or had a near fall?:No  Are you sexually active? No  Do you have more than one partner? No   Hearing Difficulties: No  Do you often ask people to speak up or repeat themselves? No  Do you experience ringing or noises in your ears? No Do you have difficulty understanding soft or whispered voices? No  Do you feel that you have a problem with memory? No Do you often misplace items? No  Do  you feel safe at home? Yes  Cognitive Testing  Alert? Yes Normal Appearance?Yes  Oriented to person? Yes Place? Yes  Time? Yes  Recall of three objects? Yes  Can perform simple calculations? Yes  Displays appropriate judgment?Yes  Can read the correct time from a watch face?Yes   List the Names of Other Physician/Practitioners you currently use:   Dr. Luna Glasgow- Orthopedics Dr. Berline Lopes- Podiatry   [facility]    Screening Tests / Date Colonoscopy          Declines  - discussed cologuard          Zostavax Declines Mammogram  UTD, 2018 Influenza Vaccine UTD Tetanus/tdap Declines Pneumonia- UTD  Bone Density- Due   ROS: GEN- denies fatigue, fever, weight loss,weakness, recent illness HEENT- denies eye drainage, change in vision, nasal discharge, CVS- denies chest pain, palpitations RESP- denies SOB, +cough, wheeze ABD- denies N/V, change in stools, abd pain GU- denies dysuria, hematuria, dribbling, incontinence MSK- + joint pain, muscle aches, injury Neuro- denies headache, dizziness, syncope, seizure activity  Physical:Vitals reviewed  GEN- NAD, alert and oriented x3 HEENT- PERRL, EOMI, non injected sclera, pink conjunctiva, MMM, oropharynx clear Neck- Supple, no thryomegaly, left carotid bruit  CVS- RRR, no murmur RESP-CTAB ABD-NABS,soft,,NT,ND EXT- No edema Pulses- Radial, DP- 2+     Assessment:    Annual wellness medicare exam  Plan:    During the course of the visit the patient was educated and counseled about appropriate screening and preventive services including:    Prevention- declines further colonoscopy, has had Flu shot shot, declines other immunizations   Fall/Depression/Audit C- NEGATIVE  No urinary incontinence problems  HTN- well controlled, no change to maxide  Hyperlipidemia- diet controlled, LDL at goal 6 months ago   Chronic cough- taking proonix for Jerrye Bushy , obtain CXR, likley PFT afterwards , likely  asthma/bronchitis that is chronic   Pt to schedule mammogram and cologuard after discussion  Carotid Ultrasound for her left bruit, lipids to be checked    Full Code  Discussed advanced directives      Diet review for nutrition referral? Yes ____ Not Indicated __x__  Patient Instructions (the written plan) was given to the patient.  Medicare Attestation  I have personally reviewed:  The patient's medical and social history  Their use of alcohol, tobacco or illicit drugs  Their current medications and supplements  The patient's functional ability including ADLs,fall risks, home safety risks, cognitive, and hearing and visual impairment  Diet and physical activities  Evidence for depression or mood disorders  The patient's weight, height, BMI, and visual acuity have been recorded in the chart. I have made referrals, counseling, and provided education to the patient based on review of the above and I have provided the patient with a written personalized care plan for preventive services.

## 2018-08-14 LAB — COMPREHENSIVE METABOLIC PANEL
AG Ratio: 1.1 (calc) (ref 1.0–2.5)
ALBUMIN MSPROF: 3.9 g/dL (ref 3.6–5.1)
ALKALINE PHOSPHATASE (APISO): 73 U/L (ref 33–130)
ALT: 11 U/L (ref 6–29)
AST: 36 U/L — ABNORMAL HIGH (ref 10–35)
BUN / CREAT RATIO: 19 (calc) (ref 6–22)
BUN: 19 mg/dL (ref 7–25)
CHLORIDE: 105 mmol/L (ref 98–110)
CO2: 25 mmol/L (ref 20–32)
CREATININE: 1.02 mg/dL — AB (ref 0.60–0.93)
Calcium: 10.5 mg/dL — ABNORMAL HIGH (ref 8.6–10.4)
GLOBULIN: 3.5 g/dL (ref 1.9–3.7)
GLUCOSE: 86 mg/dL (ref 65–99)
POTASSIUM: 4.3 mmol/L (ref 3.5–5.3)
Sodium: 142 mmol/L (ref 135–146)
TOTAL PROTEIN: 7.4 g/dL (ref 6.1–8.1)
Total Bilirubin: 0.5 mg/dL (ref 0.2–1.2)

## 2018-08-14 LAB — CBC WITH DIFFERENTIAL/PLATELET
Absolute Monocytes: 371 cells/uL (ref 200–950)
Basophils Absolute: 19 cells/uL (ref 0–200)
Basophils Relative: 0.4 %
EOS PCT: 1.7 %
Eosinophils Absolute: 80 cells/uL (ref 15–500)
HCT: 40.2 % (ref 35.0–45.0)
Hemoglobin: 13.4 g/dL (ref 11.7–15.5)
Lymphs Abs: 1781 cells/uL (ref 850–3900)
MCH: 33.2 pg — ABNORMAL HIGH (ref 27.0–33.0)
MCHC: 33.3 g/dL (ref 32.0–36.0)
MCV: 99.5 fL (ref 80.0–100.0)
MONOS PCT: 7.9 %
MPV: 11 fL (ref 7.5–12.5)
Neutro Abs: 2449 cells/uL (ref 1500–7800)
Neutrophils Relative %: 52.1 %
PLATELETS: 274 10*3/uL (ref 140–400)
RBC: 4.04 10*6/uL (ref 3.80–5.10)
RDW: 10.8 % — AB (ref 11.0–15.0)
TOTAL LYMPHOCYTE: 37.9 %
WBC: 4.7 10*3/uL (ref 3.8–10.8)

## 2018-08-14 LAB — LIPID PANEL
CHOL/HDL RATIO: 2.6 (calc) (ref <5.0)
CHOLESTEROL: 196 mg/dL (ref <200)
HDL: 75 mg/dL (ref >50)
LDL Cholesterol (Calc): 107 mg/dL (calc) — ABNORMAL HIGH
NON-HDL CHOLESTEROL (CALC): 121 mg/dL (ref <130)
Triglycerides: 55 mg/dL (ref <150)

## 2018-08-16 ENCOUNTER — Encounter: Payer: Self-pay | Admitting: *Deleted

## 2018-08-17 ENCOUNTER — Ambulatory Visit (HOSPITAL_COMMUNITY)
Admission: RE | Admit: 2018-08-17 | Discharge: 2018-08-17 | Disposition: A | Discharge status: Home / Self Care | Payer: Medicare Other | Source: Ambulatory Visit | Attending: Family Medicine | Admitting: Family Medicine

## 2018-08-17 DIAGNOSIS — R0989 Other specified symptoms and signs involving the circulatory and respiratory systems: Secondary | ICD-10-CM | POA: Diagnosis not present

## 2018-08-30 ENCOUNTER — Encounter: Payer: Self-pay | Admitting: *Deleted

## 2018-09-06 ENCOUNTER — Ambulatory Visit (HOSPITAL_COMMUNITY)
Admission: RE | Admit: 2018-09-06 | Discharge: 2018-09-06 | Disposition: A | Discharge status: Home / Self Care | Payer: Medicare Other | Source: Ambulatory Visit | Attending: Family Medicine | Admitting: Family Medicine

## 2018-09-06 DIAGNOSIS — M8589 Other specified disorders of bone density and structure, multiple sites: Secondary | ICD-10-CM | POA: Diagnosis not present

## 2018-09-06 DIAGNOSIS — R053 Chronic cough: Secondary | ICD-10-CM

## 2018-09-06 DIAGNOSIS — N951 Menopausal and female climacteric states: Secondary | ICD-10-CM

## 2018-09-06 DIAGNOSIS — Z1231 Encounter for screening mammogram for malignant neoplasm of breast: Secondary | ICD-10-CM | POA: Diagnosis not present

## 2018-09-06 DIAGNOSIS — Z1239 Encounter for other screening for malignant neoplasm of breast: Secondary | ICD-10-CM | POA: Diagnosis present

## 2018-09-06 DIAGNOSIS — R05 Cough: Secondary | ICD-10-CM

## 2018-09-06 DIAGNOSIS — Z13820 Encounter for screening for osteoporosis: Secondary | ICD-10-CM | POA: Insufficient documentation

## 2018-09-06 DIAGNOSIS — Z780 Asymptomatic menopausal state: Secondary | ICD-10-CM | POA: Diagnosis not present

## 2018-09-10 ENCOUNTER — Other Ambulatory Visit: Payer: Self-pay | Admitting: Family Medicine

## 2018-09-21 ENCOUNTER — Other Ambulatory Visit: Payer: Self-pay | Admitting: Orthopedic Surgery

## 2018-12-11 ENCOUNTER — Other Ambulatory Visit: Payer: Self-pay | Admitting: Orthopedic Surgery

## 2018-12-18 ENCOUNTER — Telehealth: Payer: Self-pay | Admitting: *Deleted

## 2018-12-18 NOTE — Telephone Encounter (Signed)
Received call from patient.   Reports that she has been having restless leg type symptoms at night. States that she is now experiencing cramping in B upper thighs during the day. States that she has not increased activity, or had any injury.   Denies edema, redness, or pain. Reports only achy cramps.   Appointment scheduled for telephone visit.

## 2018-12-18 NOTE — Telephone Encounter (Signed)
noted 

## 2018-12-21 ENCOUNTER — Ambulatory Visit (INDEPENDENT_AMBULATORY_CARE_PROVIDER_SITE_OTHER): Payer: Medicare Other | Admitting: Family Medicine

## 2018-12-21 DIAGNOSIS — M79651 Pain in right thigh: Secondary | ICD-10-CM | POA: Diagnosis not present

## 2018-12-21 DIAGNOSIS — M545 Low back pain, unspecified: Secondary | ICD-10-CM

## 2018-12-21 DIAGNOSIS — G8929 Other chronic pain: Secondary | ICD-10-CM | POA: Diagnosis not present

## 2018-12-21 MED ORDER — NAPROXEN 500 MG PO TABS: 500.0000 mg | ORAL_TABLET | Freq: Two times a day (BID) | ORAL | 2 refills | Status: DC

## 2018-12-21 MED ORDER — TIZANIDINE HCL 4 MG PO TABS: 4.0000 mg | ORAL_TABLET | Freq: Four times a day (QID) | ORAL | 0 refills | Status: DC | PRN

## 2018-12-21 NOTE — Progress Notes (Signed)
Virtual Visit via Telephone Note  I connected with [place] on 12/21/18 at  10:21am by telephone and verified that I am speaking with the correct person using two identifiers.   Pt location: at home   Physician location:at home, Vic Blackbird MD    I discussed the limitations, risks, security and privacy concerns of performing an evaluation and management service by telephone and the availability of in person appointments. I also discussed with the patient that there may be a patient responsible charge related to this service. The patient expressed understanding and agreed to proceed.   History of Present Illness:  Pt has had right thigh pain for the past week or so. 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 She has some back pain which is chronic. Denies any radiating pain from back to leg, denies numbness or tingling  No rash on the leg, no swelling of the leg  Has taken a few tylenol for pain.     Observations/Objective: Unable to observe  over phone advised patient to raise leg, abduct and adduct at hip, rotate hip denied pain, extention and flexion of leg- no pain  Assessment and Plan:  Right thigh pain- likely MSK pain as high up, no sign of rash or swelling. Has been working more at her age, more adept to injury.  She has some chronic back as well No pain with ambulating., no leg swelling.  Will try naprosyn and zanaflex and see how she does If not improved will need to evaluate in person, either here or here orthopedic     Follow Up Instructions:    I discussed the assessment and treatment plan with the patient. The patient was provided an opportunity to ask questions and all were answered. The patient agreed with the plan and demonstrated an understanding of the instructions.   The patient was advised to call back or seek an in-person evaluation if the symptoms worsen or if the condition fails to improve as  anticipated.  I provided 11 minutes of non-face-to-face time during this encounter. End time 10:32  Vic Blackbird, MD

## 2018-12-23 ENCOUNTER — Encounter: Payer: Self-pay | Admitting: Family Medicine

## 2018-12-25 ENCOUNTER — Other Ambulatory Visit: Payer: Self-pay | Admitting: Family Medicine

## 2018-12-25 NOTE — Telephone Encounter (Signed)
Ok to refill 

## 2019-01-02 ENCOUNTER — Telehealth: Payer: Self-pay | Admitting: Family Medicine

## 2019-01-02 NOTE — Telephone Encounter (Signed)
-----   Message from [place], LPN sent at 01/04/5928  1:05 PM EDT ----- Regarding: RE: F/U leg pain in 2 weeks Call placed to patient.   States that she continues to have pain and cramping in her legs, especially when she reaches up or stretches.  Appointment scheduled for 01/07/2019. ----- Message ----- From: Alycia Rossetti, MD Sent: 01/02/2019   9:56 AM EDT To: [place] Lathe [place], LPN Subject: FW: F/U leg pain in 2 weeks                    Call pt see how her thigh/leg pain is?  If not improved schedule in office visit to check     ----- Message ----- From: Alycia Rossetti, MD Sent: 01/02/2019 To: Alycia Rossetti, MD Subject: F/U leg pain in 2 weeks

## 2019-01-07 ENCOUNTER — Encounter: Payer: Self-pay | Admitting: Family Medicine

## 2019-01-07 ENCOUNTER — Ambulatory Visit (INDEPENDENT_AMBULATORY_CARE_PROVIDER_SITE_OTHER): Payer: Medicare Other | Admitting: Family Medicine

## 2019-01-07 ENCOUNTER — Other Ambulatory Visit: Payer: Self-pay

## 2019-01-07 VITALS — BP 138/74 | HR 70 | Temp 98.8°F | Resp 14 | Ht 61.0 in | Wt 210.0 lb

## 2019-01-07 DIAGNOSIS — M25551 Pain in right hip: Secondary | ICD-10-CM

## 2019-01-07 DIAGNOSIS — I10 Essential (primary) hypertension: Secondary | ICD-10-CM | POA: Diagnosis not present

## 2019-01-07 DIAGNOSIS — M79651 Pain in right thigh: Secondary | ICD-10-CM | POA: Diagnosis not present

## 2019-01-07 DIAGNOSIS — E785 Hyperlipidemia, unspecified: Secondary | ICD-10-CM

## 2019-01-07 LAB — COMPREHENSIVE METABOLIC PANEL
AG Ratio: 1.2 (calc) (ref 1.0–2.5)
ALT: 11 U/L (ref 6–29)
AST: 33 U/L (ref 10–35)
Albumin: 4.1 g/dL (ref 3.6–5.1)
Alkaline phosphatase (APISO): 73 U/L (ref 37–153)
BUN/Creatinine Ratio: 23 (calc) — ABNORMAL HIGH (ref 6–22)
BUN: 23 mg/dL (ref 7–25)
CO2: 28 mmol/L (ref 20–32)
Calcium: 10.4 mg/dL (ref 8.6–10.4)
Chloride: 106 mmol/L (ref 98–110)
Creat: 1 mg/dL — ABNORMAL HIGH (ref 0.60–0.93)
Globulin: 3.3 g/dL (calc) (ref 1.9–3.7)
Glucose, Bld: 79 mg/dL (ref 65–99)
Potassium: 4.8 mmol/L (ref 3.5–5.3)
Sodium: 140 mmol/L (ref 135–146)
Total Bilirubin: 0.6 mg/dL (ref 0.2–1.2)
Total Protein: 7.4 g/dL (ref 6.1–8.1)

## 2019-01-07 LAB — CBC WITH DIFFERENTIAL/PLATELET
Absolute Monocytes: 450 cells/uL (ref 200–950)
Basophils Absolute: 17 cells/uL (ref 0–200)
Basophils Relative: 0.3 %
Eosinophils Absolute: 80 cells/uL (ref 15–500)
Eosinophils Relative: 1.4 %
HCT: 40.2 % (ref 35.0–45.0)
Hemoglobin: 13.3 g/dL (ref 11.7–15.5)
Lymphs Abs: 2081 cells/uL (ref 850–3900)
MCH: 32.8 pg (ref 27.0–33.0)
MCHC: 33.1 g/dL (ref 32.0–36.0)
MCV: 99.3 fL (ref 80.0–100.0)
MPV: 10.8 fL (ref 7.5–12.5)
Monocytes Relative: 7.9 %
Neutro Abs: 3072 cells/uL (ref 1500–7800)
Neutrophils Relative %: 53.9 %
Platelets: 273 10*3/uL (ref 140–400)
RBC: 4.05 10*6/uL (ref 3.80–5.10)
RDW: 10.9 % — ABNORMAL LOW (ref 11.0–15.0)
Total Lymphocyte: 36.5 %
WBC: 5.7 10*3/uL (ref 3.8–10.8)

## 2019-01-07 LAB — LIPID PANEL
Cholesterol: 207 mg/dL — ABNORMAL HIGH (ref <200)
HDL: 79 mg/dL (ref >=50)
LDL Cholesterol (Calc): 114 mg/dL (calc) — ABNORMAL HIGH
Non-HDL Cholesterol (Calc): 128 mg/dL (calc) (ref <130)
Total CHOL/HDL Ratio: 2.6 (calc) (ref <5.0)
Triglycerides: 57 mg/dL (ref <150)

## 2019-01-07 MED ORDER — TRAMADOL HCL 50 MG PO TABS: 50.0000 mg | ORAL_TABLET | Freq: Three times a day (TID) | ORAL | 0 refills | Status: AC | PRN

## 2019-01-07 NOTE — Assessment & Plan Note (Signed)
Recheck lipids, diet controlled currently

## 2019-01-07 NOTE — Progress Notes (Signed)
   Subjective:    Patient ID: [name], female    DOB: [birth year], 78 y.o.   MRN: [id number]  Patient presents for Leg Pain (B upper thigh pain- is fasting)  Pt here to f/u thigh pain, R > L, she called for tele visit 2 weeks ago, was having some thightness in her thigh, but denied any injury. 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 Now has pain in her hip area and down her thigh, occ back pain but no radicular symptoms from back, denies tingling numbness in feet. No change in bowel or bladder. If she moves a certain way has pain,  Has pain with walking ,including stairs  Right knee is okay, gets some pain on and off Given zanaflex and naprosyn a few weeks ago, did not take the naprosyn was afraid due to COVID, Zanaflex did not work   HTN- taking bp meds , no SE, no CP, SOB, dizziness, due for fasting labs    Review Of Systems:  GEN- denies fatigue, fever, weight loss,weakness, recent illness HEENT- denies eye drainage, change in vision, nasal discharge, CVS- denies chest pain, palpitations RESP- denies SOB, cough, wheeze ABD- denies N/V, change in stools, abd pain GU- denies dysuria, hematuria, dribbling, incontinence MSK-+joint pain, muscle aches, injury Neuro- denies headache, dizziness, syncope, seizure activity       Objective:    BP 138/74   Pulse 70   Temp 98.8 F (37.1 C) (Oral)   Resp 14   Ht 5\' 1"  (1.549 m)   Wt 210 lb (95.3 kg)   SpO2 96%   BMI 39.68 kg/m  GEN- NAD, alert and oriented x3 HEENT- PERRL, EOMI, non injected sclera, pink conjunctiva, MMM, oropharynx clear CVS- RRR, no murmur RESP-CTAB ABD-NABS,soft,NT,ND MSK- Spine NT, fair ROM, fair ROM bilat hips, TTP Right trochanter region, thigh NT to palpation, neg SLR Non antalgic gait  Neuro-normal tone LE, sensation grossly in tact, strength In tact bilat LE EXT- No edema Pulses- Radial, DP- 2+        Assessment & Plan:      Problem List Items Addressed This Visit      Unprioritized   Essential hypertension, benign - Primary    Well controlled no changes to meds      Relevant Orders   CBC with Differential/Platelet   Comprehensive metabolic panel   Mild hyperlipidemia    Recheck lipids, diet controlled currently      Relevant Orders   Lipid panel    Other Visit Diagnoses    Right hip pain       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 Any transcriptional errors that result from this process are unintentional.

## 2019-01-07 NOTE — Patient Instructions (Addendum)
Referral to orthopedics for hip pain Take naprosyn once a day at least Ultram for severe pain  We will call with lab results  Cancel June visit  Schedule AVW for Mid after December 16th

## 2019-01-07 NOTE — Assessment & Plan Note (Signed)
Well controlled no changes to meds 

## 2019-02-13 ENCOUNTER — Ambulatory Visit: Payer: Medicare Other | Admitting: Family Medicine

## 2019-03-07 ENCOUNTER — Other Ambulatory Visit: Payer: Self-pay | Admitting: Family Medicine

## 2019-04-17 ENCOUNTER — Ambulatory Visit (INDEPENDENT_AMBULATORY_CARE_PROVIDER_SITE_OTHER): Payer: Medicare Other | Admitting: Family Medicine

## 2019-04-17 ENCOUNTER — Other Ambulatory Visit: Payer: Self-pay

## 2019-04-17 ENCOUNTER — Encounter: Payer: Self-pay | Admitting: Family Medicine

## 2019-04-17 DIAGNOSIS — Z20828 Contact with and (suspected) exposure to other viral communicable diseases: Secondary | ICD-10-CM | POA: Diagnosis not present

## 2019-04-17 DIAGNOSIS — J0100 Acute maxillary sinusitis, unspecified: Secondary | ICD-10-CM

## 2019-04-17 DIAGNOSIS — Z20822 Contact with and (suspected) exposure to covid-19: Secondary | ICD-10-CM

## 2019-04-17 MED ORDER — AMOXICILLIN 875 MG PO TABS: 875.0000 mg | ORAL_TABLET | Freq: Two times a day (BID) | ORAL | 0 refills | Status: DC

## 2019-04-17 NOTE — Progress Notes (Signed)
Virtual Visit via Telephone Note  I connected with [name] on 04/17/19 at  8:29am  by telephone and verified that I am speaking with the correct person using two identifiers.      Pt location: at home   Physician location:  In office, [facility], Vic Blackbird MD     On call: patient and physician   I discussed the limitations, risks, security and privacy concerns of performing an evaluation and management service by telephone and the availability of in person appointments. I also discussed with the patient that there may be a patient responsible charge related to this service. The patient expressed understanding and agreed to proceed.   History of Present Illness:  Pt works at [employer] as [employer]. She has been wearing PPE with N95 to care for a patienet that 3 weeks ago  Last Friday she was fully gowned and masked, sweating and felt like faint, did not have syncope, the staff pulled off her PPE and put wet cloth on her. She was swabbed on Monday, still awaiting results,  Gets tested every 2 weeks   sinus drainage, headache for the past couple weeks, , no fever - temp is taken every day at work , No SOB, no cough, no myalgias  she has been using nasal spray and her allergy medicine feels like she has more sinus infection now.    Observations/Objective: No acute distress noted the phone normal speech  Assessment and Plan: Acute sinusitis-we will treat for possible sinus infection.  She has been using her allergy medications and nasal saline Flonase.  We will add amoxicillin.  She had recent test for COVID which she will be back in the next day or 2 from her job. COVID-19 exposuire  I did discuss with her that she is high risk still working in the current environment.  She is actually planning to either retire or take a leave of absence during this because of that time she is going to speak to her HR department.  She will let me know if she needs any  documentation  Follow Up Instructions:    I discussed the assessment and treatment plan with the patient. The patient was provided an opportunity to ask questions and all were answered. The patient agreed with the plan and demonstrated an understanding of the instructions.   The patient was advised to call back or seek an in-person evaluation if the symptoms worsen or if the condition fails to improve as anticipated.  I provided 7 minutes of non-face-to-face time during this encounter. Emd time: 8:36am    Vic Blackbird, MD

## 2019-04-18 ENCOUNTER — Telehealth: Payer: Self-pay | Admitting: *Deleted

## 2019-04-18 NOTE — Telephone Encounter (Signed)
Received call from patient.   Reports that her COVID 19 testing from her work did return postive.   Advised to quarantine x14 days. Advised that she will need to have improvement in Sx, and be at least 72 hours fever free without APAP/IBU before she can lift quarantine and return to work. Advised is SOB noted or worsens, she should go to ER for evaluation.  Advised that our office will provide any documentation to remain out of work as needed. States that she will be taking a leave as she is high risk of complications and se does not want to contract virus again. Advised to have HR fax FMLA for leave.

## 2019-04-19 ENCOUNTER — Other Ambulatory Visit: Payer: Self-pay

## 2019-04-19 ENCOUNTER — Encounter (HOSPITAL_COMMUNITY): Payer: Self-pay | Admitting: Emergency Medicine

## 2019-04-19 ENCOUNTER — Emergency Department (HOSPITAL_COMMUNITY)
Admission: EM | Admit: 2019-04-19 | Discharge: 2019-04-19 | Disposition: A | Discharge status: Home / Self Care | Payer: Medicare Other | Attending: Emergency Medicine | Admitting: Emergency Medicine

## 2019-04-19 DIAGNOSIS — I10 Essential (primary) hypertension: Secondary | ICD-10-CM | POA: Insufficient documentation

## 2019-04-19 DIAGNOSIS — Z7982 Long term (current) use of aspirin: Secondary | ICD-10-CM | POA: Diagnosis not present

## 2019-04-19 DIAGNOSIS — U071 COVID-19: Secondary | ICD-10-CM | POA: Diagnosis not present

## 2019-04-19 DIAGNOSIS — O071 Delayed or excessive hemorrhage following failed attempted termination of pregnancy: Secondary | ICD-10-CM | POA: Diagnosis not present

## 2019-04-19 DIAGNOSIS — R531 Weakness: Secondary | ICD-10-CM | POA: Diagnosis not present

## 2019-04-19 DIAGNOSIS — Z79899 Other long term (current) drug therapy: Secondary | ICD-10-CM | POA: Diagnosis not present

## 2019-04-19 DIAGNOSIS — R42 Dizziness and giddiness: Secondary | ICD-10-CM | POA: Diagnosis not present

## 2019-04-19 DIAGNOSIS — E869 Volume depletion, unspecified: Secondary | ICD-10-CM | POA: Diagnosis not present

## 2019-04-19 DIAGNOSIS — R630 Anorexia: Secondary | ICD-10-CM | POA: Diagnosis not present

## 2019-04-19 DIAGNOSIS — K219 Gastro-esophageal reflux disease without esophagitis: Secondary | ICD-10-CM | POA: Diagnosis not present

## 2019-04-19 NOTE — Telephone Encounter (Signed)
noted 

## 2019-04-19 NOTE — ED Triage Notes (Signed)
Pt arrived via EMS for fatigue. Pt found out she is COVID-19 positive on Wednesday. Pt seen earlier today at [facility] for same, was given fluids and mag, then discharged home and told to rest. Pt states that she didn't feel any better once she got home so her daughter called EMS.

## 2019-04-19 NOTE — ED Notes (Signed)
Pt given extensive education about symptoms of COVID-19, and what to expect in the coming weeks. Pt verbalizes understanding.

## 2019-04-19 NOTE — ED Provider Notes (Signed)
[facility] EMERGENCY DEPARTMENT Provider Note   CSN: [id number] Arrival date & time: 04/19/19  1913     History   Chief Complaint Chief Complaint  Patient presents with   COVID +   Fatigue    HPI [name] is a 78 y.o. female presenting for evaluation of generalized weakness.   Patient states she tested positive for coronavirus 2 days ago.  She was evaluated at [facility] today, found to have low magnesium, given fluids, mag, and discharge.  Patient states she was feeling slightly better upon discharge.  After she went home, she reports generalized weakness which is present when she goes from sitting to standing.  She states she has not had anything to eat today, and very little yesterday, she has little to no appetite.  She reports no dyspnea, decreased appetite, and weakness.  She reports a mild cough.  She denies fevers, chills, sore throat, chest pain, shortness of breath, nausea, vomiting, dumping, urinary symptoms, abnormal bowel movements.  Patient's daughter also tested positive for COVID.  No one else around her is a known positive.  Patient works as a Quarry manager at a [employer].  She has a history of hypertension, gout, arthritis, reflux.  She has not been taking anything for her symptoms.  Additional history obtained from paperwork patient brought with her.  Patient's magnesium was 1.6, normal is 1.7.  Otherwise, labs were reassuring.  White count was 3.7.  Creatinine stable (1.1).  Hemoglobin stable (13.1).  Patient states she had an x-ray which was read as normal.     HPI  Past Medical History:  Diagnosis Date   Acid reflux    Arthritis    Gout    Gout    Hypertension    Shortness of breath     Patient Active Problem List   Diagnosis Date Noted   Gout 04/01/2016   Paresthesia of bilateral legs 11/26/2013   Vitamin D deficiency 04/14/2013   Obesity 07/13/2012   OA (osteoarthritis) of knee 03/13/2012   Mild hyperlipidemia 12/13/2011   GERD  (gastroesophageal reflux disease) 11/16/2011   Essential hypertension, benign 11/15/2011    Past Surgical History:  Procedure Laterality Date   MASS EXCISION N/A 04/11/2014   Procedure: EXCISION SOFT TISSUE NEOPLASM,  BACK;  Surgeon: Jamesetta So, MD;  Location: [facility];  Service: General;  Laterality: N/A;   TONSILLECTOMY     TUBAL LIGATION       OB History   No obstetric history on file.      Home Medications    Prior to Admission medications   Medication Sig Start Date End Date Taking? Authorizing Provider  albuterol (PROVENTIL HFA;VENTOLIN HFA) 108 (90 Base) MCG/ACT inhaler Inhale 2 puffs into the lungs every 6 (six) hours as needed for wheezing or shortness of breath. 02/13/18   Alycia Rossetti, MD  allopurinol (ZYLOPRIM) 100 MG tablet TAKE 2 TABLETS BY MOUTH EVERY DAY 03/07/19   Alycia Rossetti, MD  amoxicillin (AMOXIL) 875 MG tablet Take 1 tablet (875 mg total) by mouth 2 (two) times daily. 04/17/19   Alycia Rossetti, MD  aspirin EC 81 MG tablet Take 81 mg by mouth daily.      [provider]  Calcium Carbonate-Vitamin D (CALCIUM-D) 600-400 MG-UNIT TABS Take by mouth 2 (two) times daily.    [provider]  diclofenac sodium (VOLTAREN) 1 % GEL Apply 2 g topically 4 (four) times daily. Pain 02/14/14   Vic Blackbird F,  MD  fluticasone (FLONASE) 50 MCG/ACT nasal spray Place 2 sprays into both nostrils daily. 08/25/17   Alycia Rossetti, MD  loratadine (CLARITIN) 10 MG tablet Take 1 tablet (10 mg total) by mouth daily as needed for allergies. 11/30/15   Alycia Rossetti, MD  naproxen (NAPROSYN) 500 MG tablet Take 1 tablet (500 mg total) by mouth 2 (two) times daily with a meal. 12/21/18   [place], Modena Nunnery, MD  pantoprazole (PROTONIX) 40 MG tablet TAKE 1 TABLET BY MOUTH ONCE DAILY 09/11/18   [place], Modena Nunnery, MD  tiZANidine (ZANAFLEX) 4 MG tablet Take 1 tablet (4 mg total) by mouth every 6 (six) hours as needed for muscle spasms. 12/21/18   Alycia Rossetti,  MD  triamterene-hydrochlorothiazide [facility]) 37.5-25 MG tablet TAKE 1 TABLET BY MOUTH EVERY DAY 03/07/19   Alycia Rossetti, MD    Family History Family History  Problem Relation Age of Onset   Cancer Mother    Diabetes Father     Social History Social History   Tobacco Use   Smoking status: Never Smoker   Smokeless tobacco: Never Used  Substance Use Topics   Alcohol use: No   Drug use: No     Allergies   Patient has no known allergies.   Review of Systems Review of Systems  Constitutional: Positive for appetite change.  Respiratory: Positive for cough.   Neurological: Positive for weakness.  All other systems reviewed and are negative.    Physical Exam Updated Vital Signs BP 126/79    Pulse 87    Temp 98.9 F (37.2 C) (Oral)    Resp 17    Ht 5\' 3"  (1.6 m)    Wt 100.7 kg    SpO2 97%    BMI 39.33 kg/m   Physical Exam Vitals signs and nursing note reviewed.  Constitutional:      General: She is not in acute distress.    Appearance: She is well-developed.     Comments: Resting comfortably in the bed in no acute distress  HENT:     Head: Normocephalic and atraumatic.  Eyes:     Conjunctiva/sclera: Conjunctivae normal.     Pupils: Pupils are equal, round, and reactive to light.  Neck:     Musculoskeletal: Normal range of motion and neck supple.  Cardiovascular:     Rate and Rhythm: Normal rate and regular rhythm.     Pulses: Normal pulses.  Pulmonary:     Effort: Pulmonary effort is normal. No respiratory distress.     Breath sounds: Normal breath sounds. No wheezing.     Comments: Speaking in full sentences.  Clear lung sounds in all fields. Abdominal:     General: There is no distension.     Palpations: Abdomen is soft. There is no mass.     Tenderness: There is no abdominal tenderness. There is no guarding or rebound.  Musculoskeletal: Normal range of motion.  Skin:    General: Skin is warm and dry.     Capillary Refill: Capillary refill  takes less than 2 seconds.  Neurological:     Mental Status: She is alert and oriented to person, place, and time.      ED Treatments / Results  Labs (all labs ordered are listed, but only abnormal results are displayed) Labs Reviewed - No data to display  EKG None  Radiology No results found.  Procedures Procedures (including critical care time)  Medications Ordered in ED Medications - No data to  display   Initial Impression / Assessment and Plan / ED Course  I have reviewed the triage vital signs and the nursing notes.  Pertinent labs & imaging results that were available during my care of the patient were reviewed by me and considered in my medical decision making (see chart for details).        Patient presenting for evaluation of generalized weakness.  Physical examination, she appears nontoxic.  Patient positive for COVID.  She has not been eating, has had nothing to eat today.  This is abnormal for her.  Patient was able to ambulate from the ambulance bay to the room without difficulty.  Sats remained at 99%.  She was not tachycardic or hypotensive.  Lab work from previous ER visit today reviewed.  I do not believe she needs repeat labs today, as it has only been several hours.  No signs of respiratory distress, pulmonary exam reassuring.  I do not believe she needs admission for coronavirus at this time, she has no tachypnea, hypoxia, or signs of respiratory distress.  Discussed typical course of viral illness.  Discussed symptomatic treatment at home with monitoring for signs of worsening respiratory status. At this time, pt appears safe for d/c. Return precautions given. Pt states she understands and agrees to plan.    Final Clinical Impressions(s) / ED Diagnoses   Final diagnoses:  COVID-19  General weakness  Decreased appetite    ED Discharge Orders    None       Franchot Heidelberg, PA-C 04/19/19 2051    Francine Graven, DO 04/21/19 1531

## 2019-04-19 NOTE — ED Notes (Signed)
Pt ambulatory in room with no difficulty or distress noted, PA aware.

## 2019-04-19 NOTE — Discharge Instructions (Addendum)
You have a viral illness (Covid).  This should be treated symptomatically. Use Tylenol or ibuprofen as needed for fevers or body aches. Make sure you stay well-hydrated with water. You need to eat regular meals to keep up your energy.  Wash your hands frequently to prevent spread of infection. I recommend getting a pulse-oximeter device to check you oxygen levels. If they are low, you should be evaluated again in the ER. Return to the emergency room if you develop chest pain, difficulty breathing, or any new or worsening symptoms.

## 2019-04-22 ENCOUNTER — Telehealth: Payer: Self-pay | Admitting: Family Medicine

## 2019-04-22 NOTE — Telephone Encounter (Signed)
noted 

## 2019-04-22 NOTE — Telephone Encounter (Signed)
Call placed to patient for daily report.   Patient Reported Symptoms Y/N  Cough N  SOB N  Fever N  Taking IBU/APAP N  Chills Y  Muscle pain N  Headache Y  Sore throat N  Loss of taste/ smell Y- TASTE   Patient reports that she is taking the Mag prescribed by ER. States that she continues to have some weakness, but she does feel that she is improving.   MD to be made aware.

## 2019-04-22 NOTE — Telephone Encounter (Signed)
Call pt see how she is doing, COVID positive  Seen at ER on 8/21 with increased fatigue

## 2019-04-23 ENCOUNTER — Telehealth: Payer: Self-pay | Admitting: *Deleted

## 2019-04-23 NOTE — Telephone Encounter (Signed)
Received call from patient.   Reports that she requires note for work. States that she is scheduled to return on 04/26/2019, but she does not feel that she will be up for it due to her recent hospitalization with COVID.    Patient Reported Symptoms Y/N  Cough N  SOB N  Fever N  Taking IBU/APAP N  Chills N  Muscle pain N  Headache Y  Sore throat N  Loss of taste/ smell Y- taste    States that she is continuing to have weakness.   MD made aware and advised to schedule telephone visit to discuss.   Call placed to patient and patient made aware. Appointment scheduled.

## 2019-04-23 NOTE — Telephone Encounter (Signed)
noted 

## 2019-04-24 ENCOUNTER — Other Ambulatory Visit: Payer: Self-pay

## 2019-04-24 ENCOUNTER — Ambulatory Visit (INDEPENDENT_AMBULATORY_CARE_PROVIDER_SITE_OTHER): Payer: Medicare Other | Admitting: Family Medicine

## 2019-04-24 DIAGNOSIS — U071 COVID-19: Secondary | ICD-10-CM | POA: Diagnosis not present

## 2019-04-24 NOTE — Progress Notes (Signed)
Virtual Visit via Telephone Note  I connected with [name] on 04/24/19 at  4:01pm by telephone and verified that I am speaking with the correct person using two identifiers.        Pt location: at home   Physician location:  In office, [facility], Vic Blackbird MD     On call: patient and physician   I discussed the limitations, risks, security and privacy concerns of performing an evaluation and management service by telephone and the availability of in person appointments. I also discussed with the patient that there may be a patient responsible charge related to this service. The patient expressed understanding and agreed to proceed.   History of Present Illness:  F/U on COVID-19 infection,  Most likelt contracted through work, on 8/19  still has decreased appetite, weakness has improved some. She is drinking plenty of gaterade and water  Breathing is okay, oxygen level staying at 96-97% at home. Tmax 98.83F.  Pt scheduled to go back to work at [employer] on 8/26  This is a workers comp claim she states someone from financial office called her asking questions about her exposure   works as Quarry manager at [employer] She would like to take a medical leave until first week of November to recuperate from [place] and get her strength back up. She has concerns about working with sick COVID/FLU patients at this time as well.  HR is [place]  Observations/Objective: NAD noted over phone, speaking full sentences   Assessment and Plan: COVID-19 viral infection, positive testing, elderly female in high risk category for complications and based on most illness affecting this population can take longer to recover. Concentrate on fluids and small meals throughout the day, resting. I do not recommend she go back to work at this time either.  Will write letter and likley will need FMLA to support this.  Pt in agreement   Follow Up Instructions:    I discussed the assessment and  treatment plan with the patient. The patient was provided an opportunity to ask questions and all were answered. The patient agreed with the plan and demonstrated an understanding of the instructions.   The patient was advised to call back or seek an in-person evaluation if the symptoms worsen or if the condition fails to improve as anticipated.  I provided 11 minutes of non-face-to-face time during this encounter. End 4:11  Vic Blackbird, MD

## 2019-04-25 ENCOUNTER — Encounter: Payer: Self-pay | Admitting: Family Medicine

## 2019-04-26 ENCOUNTER — Encounter: Payer: Self-pay | Admitting: Family Medicine

## 2019-04-26 ENCOUNTER — Other Ambulatory Visit: Payer: Self-pay | Admitting: Family Medicine

## 2019-05-10 ENCOUNTER — Telehealth: Payer: Self-pay | Admitting: Family Medicine

## 2019-05-10 NOTE — Telephone Encounter (Signed)
Pt called was concerned she was getting text messages from her job about getting tested twice a week Advised she does not need to have this done as she is not working  that is being sent to all employees She is doing okay, has some days where she is more fatigued, appetite is fair

## 2019-05-10 NOTE — Telephone Encounter (Signed)
-----   Message from Lennon, LPN sent at [id number]  4:34 PM EDT ----- Regarding: Return Call Received VM from patient requesting return call. Call placed to patient and she states that her daughter, Madaline Savage told her you wanted her to call.   MD please advise.

## 2019-05-13 ENCOUNTER — Encounter (HOSPITAL_COMMUNITY): Payer: Self-pay

## 2019-05-13 ENCOUNTER — Other Ambulatory Visit: Payer: Self-pay

## 2019-05-13 ENCOUNTER — Emergency Department (HOSPITAL_COMMUNITY): Payer: Medicare Other

## 2019-05-13 ENCOUNTER — Emergency Department (HOSPITAL_COMMUNITY)
Admission: EM | Admit: 2019-05-13 | Discharge: 2019-05-13 | Disposition: A | Discharge status: Home / Self Care | Payer: Medicare Other | Attending: Emergency Medicine | Admitting: Emergency Medicine

## 2019-05-13 ENCOUNTER — Telehealth: Payer: Self-pay | Admitting: Family Medicine

## 2019-05-13 DIAGNOSIS — R531 Weakness: Secondary | ICD-10-CM | POA: Insufficient documentation

## 2019-05-13 DIAGNOSIS — M6281 Muscle weakness (generalized): Secondary | ICD-10-CM | POA: Diagnosis not present

## 2019-05-13 DIAGNOSIS — M79605 Pain in left leg: Secondary | ICD-10-CM | POA: Insufficient documentation

## 2019-05-13 DIAGNOSIS — I10 Essential (primary) hypertension: Secondary | ICD-10-CM | POA: Diagnosis not present

## 2019-05-13 DIAGNOSIS — Z79899 Other long term (current) drug therapy: Secondary | ICD-10-CM | POA: Diagnosis not present

## 2019-05-13 DIAGNOSIS — M79662 Pain in left lower leg: Secondary | ICD-10-CM | POA: Diagnosis not present

## 2019-05-13 DIAGNOSIS — Z7982 Long term (current) use of aspirin: Secondary | ICD-10-CM | POA: Diagnosis not present

## 2019-05-13 DIAGNOSIS — I6782 Cerebral ischemia: Secondary | ICD-10-CM | POA: Diagnosis not present

## 2019-05-13 LAB — CBC WITH DIFFERENTIAL/PLATELET
Abs Immature Granulocytes: 0.01 10*3/uL (ref 0.00–0.07)
Basophils Absolute: 0 10*3/uL (ref 0.0–0.1)
Basophils Relative: 0 %
Eosinophils Absolute: 0 10*3/uL (ref 0.0–0.5)
Eosinophils Relative: 1 %
HCT: 41.2 % (ref 36.0–46.0)
Hemoglobin: 13.3 g/dL (ref 12.0–15.0)
Immature Granulocytes: 0 %
Lymphocytes Relative: 24 %
Lymphs Abs: 1.4 10*3/uL (ref 0.7–4.0)
MCH: 33.2 pg (ref 26.0–34.0)
MCHC: 32.3 g/dL (ref 30.0–36.0)
MCV: 102.7 fL — ABNORMAL HIGH (ref 80.0–100.0)
Monocytes Absolute: 0.4 10*3/uL (ref 0.1–1.0)
Monocytes Relative: 7 %
Neutro Abs: 3.9 10*3/uL (ref 1.7–7.7)
Neutrophils Relative %: 68 %
Platelets: 299 10*3/uL (ref 150–400)
RBC: 4.01 MIL/uL (ref 3.87–5.11)
RDW: 12.5 % (ref 11.5–15.5)
WBC: 5.8 10*3/uL (ref 4.0–10.5)
nRBC: 0 % (ref 0.0–0.2)

## 2019-05-13 LAB — COMPREHENSIVE METABOLIC PANEL
ALT: 14 U/L (ref 0–44)
AST: 32 U/L (ref 15–41)
Albumin: 3.9 g/dL (ref 3.5–5.0)
Alkaline Phosphatase: 73 U/L (ref 38–126)
Anion gap: 9 (ref 5–15)
BUN: 15 mg/dL (ref 8–23)
CO2: 26 mmol/L (ref 22–32)
Calcium: 9.8 mg/dL (ref 8.9–10.3)
Chloride: 101 mmol/L (ref 98–111)
Creatinine, Ser: 1.07 mg/dL — ABNORMAL HIGH (ref 0.44–1.00)
GFR calc Af Amer: 58 mL/min — ABNORMAL LOW (ref >60)
GFR calc non Af Amer: 50 mL/min — ABNORMAL LOW (ref >60)
Glucose, Bld: 105 mg/dL — ABNORMAL HIGH (ref 70–99)
Potassium: 3.6 mmol/L (ref 3.5–5.1)
Sodium: 136 mmol/L (ref 135–145)
Total Bilirubin: 0.8 mg/dL (ref 0.3–1.2)
Total Protein: 8.3 g/dL — ABNORMAL HIGH (ref 6.5–8.1)

## 2019-05-13 NOTE — ED Provider Notes (Signed)
[facility] EMERGENCY DEPARTMENT Provider Note   CSN: [id number] Arrival date & time: 05/13/19  1258     History   Chief Complaint Chief Complaint  Patient presents with  . Hypertension    HPI [name] is a 78 y.o. female.     Patient is a 78 year old female who presents to the emergency department because of difficulty with her blood pressure and weakness of her left leg.  The patient states that in August she was diagnosed with the COVID-19 virus.  She underwent the 14-day quarantine.  She says she is not been completely "right" since that time.  She has problems with weakness.  She says she has been having poor appetite.  She also reports weakness of the left leg, and says at times is difficult to ambulate.  She has been checking her blood pressure recently and she says that it has been as high as 190/110 at home.  The patient is concerned following the COVID-19 virus, and this accompanying weakness of the extremity, generalized weakness, poor appetite, and elevation of her blood pressure.  There is been no recent temperature elevation, no changes in taste or smell, no cough, and no shortness of breath or difficulty breathing.  Nothing makes this any worse, and nothing really seems to help.  The history is provided by the patient.  Hypertension Pertinent negatives include no chest pain, no abdominal pain and no shortness of breath.    Past Medical History:  Diagnosis Date  . Acid reflux   . Arthritis   . Gout   . Gout   . Hypertension   . Shortness of breath     Patient Active Problem List   Diagnosis Date Noted  . Gout 04/01/2016  . Paresthesia of bilateral legs 11/26/2013  . Vitamin D deficiency 04/14/2013  . Obesity 07/13/2012  . OA (osteoarthritis) of knee 03/13/2012  . Mild hyperlipidemia 12/13/2011  . GERD (gastroesophageal reflux disease) 11/16/2011  . Essential hypertension, benign 11/15/2011    Past Surgical History:  Procedure Laterality  Date  . MASS EXCISION N/A 04/11/2014   Procedure: EXCISION SOFT TISSUE NEOPLASM,  BACK;  Surgeon: Jamesetta So, MD;  Location: [facility];  Service: General;  Laterality: N/A;  . TONSILLECTOMY    . TUBAL LIGATION       OB History   No obstetric history on file.      Home Medications    Prior to Admission medications   Medication Sig Start Date End Date Taking? Authorizing Provider  albuterol (VENTOLIN HFA) 108 (90 Base) MCG/ACT inhaler INHALE TWO PUFFS INTO LUNGS EVERY SIX HOURS AS NEEDED FOR WHEEZING OR SHORTNESS OF BREATH. 04/26/19   Alycia Rossetti, MD  allopurinol (ZYLOPRIM) 100 MG tablet TAKE 2 TABLETS BY MOUTH EVERY DAY 03/07/19   [place], Modena Nunnery, MD  amoxicillin (AMOXIL) 875 MG tablet Take 1 tablet (875 mg total) by mouth 2 (two) times daily. 04/17/19   Alycia Rossetti, MD  aspirin EC 81 MG tablet Take 81 mg by mouth daily.      [provider]  Calcium Carbonate-Vitamin D (CALCIUM-D) 600-400 MG-UNIT TABS Take by mouth 2 (two) times daily.    [provider]  diclofenac sodium (VOLTAREN) 1 % GEL Apply 2 g topically 4 (four) times daily. Pain 02/14/14   Alycia Rossetti, MD  fluticasone [facility]) 50 MCG/ACT nasal spray Place 2 sprays into both nostrils daily. 08/25/17   Alycia Rossetti, MD  loratadine (CLARITIN) 10 MG tablet  Take 1 tablet (10 mg total) by mouth daily as needed for allergies. 11/30/15   Alycia Rossetti, MD  naproxen (NAPROSYN) 500 MG tablet Take 1 tablet (500 mg total) by mouth 2 (two) times daily with a meal. 12/21/18   [place], Modena Nunnery, MD  pantoprazole (PROTONIX) 40 MG tablet TAKE 1 TABLET BY MOUTH ONCE DAILY 09/11/18   [place], Modena Nunnery, MD  tiZANidine (ZANAFLEX) 4 MG tablet Take 1 tablet (4 mg total) by mouth every 6 (six) hours as needed for muscle spasms. 12/21/18   Alycia Rossetti, MD  triamterene-hydrochlorothiazide [facility]) 37.5-25 MG tablet TAKE 1 TABLET BY MOUTH EVERY DAY 03/07/19   Alycia Rossetti, MD    Family History Family  History  Problem Relation Age of Onset  . Cancer Mother   . Diabetes Father     Social History Social History   Tobacco Use  . Smoking status: Never Smoker  . Smokeless tobacco: Never Used  Substance Use Topics  . Alcohol use: No  . Drug use: No     Allergies   Patient has no known allergies.   Review of Systems Review of Systems  Constitutional: Positive for appetite change. Negative for activity change.  HENT: Negative for congestion, ear discharge, ear pain, facial swelling, nosebleeds, rhinorrhea, sneezing and tinnitus.   Eyes: Negative for photophobia, pain and discharge.  Respiratory: Negative for cough, choking, shortness of breath and wheezing.   Cardiovascular: Negative for chest pain, palpitations and leg swelling.  Gastrointestinal: Negative for abdominal pain, blood in stool, constipation, diarrhea, nausea and vomiting.  Genitourinary: Negative for difficulty urinating, dysuria, flank pain, frequency and hematuria.  Musculoskeletal: Negative for back pain, gait problem, myalgias and neck pain.  Skin: Negative for color change, rash and wound.  Neurological: Positive for weakness. Negative for dizziness, seizures, syncope, facial asymmetry, speech difficulty and numbness.  Hematological: Negative for adenopathy. Does not bruise/bleed easily.  Psychiatric/Behavioral: Negative for agitation, confusion, hallucinations, self-injury and suicidal ideas. The patient is not nervous/anxious.      Physical Exam Updated Vital Signs BP (!) 142/94 (BP Location: Right Arm)   Pulse 84   Temp 98.9 F (37.2 C) (Oral)   Resp 18   Ht 5\' 3"  (1.6 m)   Wt 100.7 kg   SpO2 98%   BMI 39.33 kg/m   Physical Exam Constitutional:      General: She is not in acute distress.    Appearance: Normal appearance. She is well-developed.  HENT:     Head: Normocephalic and atraumatic.     Right Ear: Tympanic membrane, ear canal and external ear normal.     Left Ear: Tympanic membrane,  ear canal and external ear normal.     Nose: Nose normal.     Mouth/Throat:     Pharynx: Uvula midline.  Eyes:     General: Lids are normal.     Conjunctiva/sclera: Conjunctivae normal.     Pupils: Pupils are equal, round, and reactive to light.  Neck:     Musculoskeletal: Normal range of motion and neck supple.     Vascular: No carotid bruit.     Trachea: Trachea and phonation normal.  Cardiovascular:     Rate and Rhythm: Normal rate and regular rhythm.     Pulses: Normal pulses.  Abdominal:     General: Bowel sounds are normal.     Palpations: Abdomen is soft.  Musculoskeletal:        General: No deformity.     Right  lower leg: No edema.     Left lower leg: No edema.  Lymphadenopathy:     Head:     Right side of head: No submental, preauricular or posterior auricular adenopathy.     Left side of head: No submental, preauricular or posterior auricular adenopathy.     Cervical: No cervical adenopathy.  Skin:    General: Skin is warm and dry.     Capillary Refill: Capillary refill takes less than 2 seconds.  Neurological:     Mental Status: She is alert and oriented to person, place, and time.     GCS: GCS eye subscore is 4. GCS verbal subscore is 5. GCS motor subscore is 6.     Cranial Nerves: No cranial nerve deficit.     Sensory: No sensory deficit.     Comments: No gross neurologic deficits appreciated on examination. Patient is ambulatory, but walks with stiffness, and a slight limp.  No foot drop appreciated.  No sensory deficits noted of the upper or lower extremities.  Psychiatric:        Mood and Affect: Mood normal.        Speech: Speech normal.      ED Treatments / Results  Labs (all labs ordered are listed, but only abnormal results are displayed) Labs Reviewed  CBC WITH DIFFERENTIAL/PLATELET - Abnormal; Notable for the following components:      Result Value   MCV 102.7 (*)    All other components within normal limits  COMPREHENSIVE METABOLIC PANEL -  Abnormal; Notable for the following components:   Glucose, Bld 105 (*)    Creatinine, Ser 1.07 (*)    Total Protein 8.3 (*)    GFR calc non Af Amer 50 (*)    GFR calc Af Amer 58 (*)    All other components within normal limits    EKG EKG Interpretation  Date/Time:  Monday May 13 2019 13:14:35 EDT Ventricular Rate:  90 PR Interval:  152 QRS Duration: 90 QT Interval:  360 QTC Calculation: 440 R Axis:   -48 Text Interpretation:  Normal sinus rhythm Right atrial enlargement Pulmonary disease pattern Left anterior fascicular block Moderate voltage criteria for LVH, may be normal variant Abnormal ECG Confirmed by Fredia Sorrow [phone]) on 05/13/2019 6:38:50 PM   Radiology No results found.  Procedures Procedures (including critical care time)  Medications Ordered in ED Medications - No data to display   Initial Impression / Assessment and Plan / ED Course  I have reviewed the triage vital signs and the nursing notes.  Pertinent labs & imaging results that were available during my care of the patient were reviewed by me and considered in my medical decision making (see chart for details).          Final Clinical Impressions(s) / ED Diagnoses MDM  Blood pressure 0000000 systolic here in the emergency department.  Pulse oximetry is 99% on room air.  Within normal limits by my interpretation.  Patient presents to the emergency department because of high blood pressure readings at home, as well as increasing left leg weakness.  Patient states that she has felt weak and generally not felt well since a positive COVID-19 test in August of this year.  Complete blood count is within normal limits.  Comprehensive metabolic panel shows the creatinine to be slightly elevated at 1.07.  The anion gap is normal at 9.  CT head scan is negative for acute problems.  Examination and history suggest possible post COVID  symptoms.  I have asked the patient to follow-up with her  primary physician for additional evaluation and management.  Case reviewed with Dr Rogene Houston.   Final diagnoses:  Hypertension, unspecified type  Pain of left lower extremity    ED Discharge Orders    None       Lily Kocher, PA-C 05/15/19 1448    Fredia Sorrow, MD 05/20/19 [phone]

## 2019-05-13 NOTE — Telephone Encounter (Signed)
[name] patients daughter called states she has left a vm for Christina this morning. Patient is complaining of high blood pressure, numbness in legs and no appetite. [name] is taking her to ER at [facility] since patient is just getting over [place].   CB# [phone]

## 2019-05-13 NOTE — ED Notes (Signed)
Patient denies any dizziness.

## 2019-05-13 NOTE — ED Triage Notes (Signed)
Pt presents to ED with complaints of hypertension and weakness in left leg. Pt states BP has been elevated since yesterday as high as 190/110. Pt states her left leg has been getting weak since "last week, about Wednesday"

## 2019-05-13 NOTE — ED Provider Notes (Signed)
Medical screening examination/treatment/procedure(s) were conducted as a shared visit with non-physician practitioner(s) and myself.  I personally evaluated the patient during the encounter.  EKG Interpretation  Date/Time:  Monday May 13 2019 13:14:35 EDT Ventricular Rate:  90 PR Interval:  152 QRS Duration: 90 QT Interval:  360 QTC Calculation: 440 R Axis:   -48 Text Interpretation:  Normal sinus rhythm Right atrial enlargement Pulmonary disease pattern Left anterior fascicular block Moderate voltage criteria for LVH, may be normal variant Abnormal ECG Confirmed by Fredia Sorrow [phone]) on 05/13/2019 6:38:50 PM   Patient seen by me along physician assistant.  Patient had diagnosis of COVID-19 infection end of August.  Patient states she has not felt quite right since that time.  Patient came in with a complaint of hypertension and weakness in left leg.  But none of this is acute is been going on for at least several days.  Patient states blood pressure been elevated since yesterday.  Patient has not followed up with her primary care doctor.  Patient was feeling not quite right ever since the initial infection weakness is continued and she thinks that somehow related to the [place].  Head CT done without any acute findings which is reassuring since is been going on for a while.  Patient may require outpatient MRI.  But there is no urgent need for MRI at this time.   Fredia Sorrow, MD 05/13/19 2023

## 2019-05-13 NOTE — ED Notes (Signed)
Patient ambulates with no problems.

## 2019-05-13 NOTE — ED Notes (Signed)
Patient is back from CT scan.

## 2019-05-13 NOTE — ED Notes (Signed)
Patient transported to CT 

## 2019-05-13 NOTE — Discharge Instructions (Addendum)
The CT scan of your head is negative for any acute changes or problems.  Your chemistries are within normal limits.  Please continue your current blood pressure medication.  Your blood pressure here in the emergency department was 142/94, significantly improved from the readings you were getting at home.  Please set up an appointment with Dr. Buelah Manis concerning the discomfort and weakness of your left leg.  There may be some residual changes related to your previous COVID-19 virus.  Please return to the emergency department if any changes in your condition, problems, worsening of your symptoms, or concerns.

## 2019-05-14 NOTE — Telephone Encounter (Signed)
VM was left on 05/13/2019 stating that BP was running 170/80.   Noted.

## 2019-05-17 ENCOUNTER — Encounter: Payer: Self-pay | Admitting: Family Medicine

## 2019-05-17 ENCOUNTER — Ambulatory Visit (INDEPENDENT_AMBULATORY_CARE_PROVIDER_SITE_OTHER): Payer: Medicare Other | Admitting: Family Medicine

## 2019-05-17 VITALS — BP 146/80 | HR 88 | Temp 98.2°F | Resp 14 | Ht 61.0 in | Wt 210.0 lb

## 2019-05-17 DIAGNOSIS — Z23 Encounter for immunization: Secondary | ICD-10-CM

## 2019-05-17 DIAGNOSIS — I10 Essential (primary) hypertension: Secondary | ICD-10-CM

## 2019-05-17 DIAGNOSIS — R790 Abnormal level of blood mineral: Secondary | ICD-10-CM

## 2019-05-17 NOTE — Assessment & Plan Note (Signed)
Blood pressure is improving now that she is back on her medications.  And will have her continue with meds and she will call me on Tuesday with her readings through the weekend as she will been on her meds for complete week at that time.  Been to recheck her metabolic panel and her magnesium level.  I think some of her stressors and weakness are deftly COVID-19 related but she is on the pill at this point.  She does not have any further paresthesias in her lower extremities her CT scan did not reveal any acute abnormality.  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  We have another follow-up visit via phone in a few weeks for she makes her decision.

## 2019-05-17 NOTE — Patient Instructions (Signed)
F/U as previous  Send me your blood pressure readings on Tuesday No change to medication

## 2019-05-17 NOTE — Progress Notes (Signed)
Subjective:    Patient ID: [name], female    DOB: 08-13-1941, 78 y.o.   MRN: [id number]  Patient presents for ER F/U (L Leg Numbness/ elevated BP)   Pt here to f/u ER visit , seen in ER 9/14 with elevated BP spiked up to  190/110  She also had left leg numbness on the lateral aspect.   She remembers 3 days after positive covid, she became real nasoues and sick went to [facility] given IVF and mag, was not able to take regulary due to feeling ill, and she was off her maxide and other meds for about 2 weeks before going to ER per above  She has had mild headache  CT Head no acute changes, labs showed mild renal insuffiencey   Today she feels better but at times feels like her balance is off. Tastebuds have been improving. She ate good yesterday  She restarted meds on Monday    P readings Monday 171/98 AM,   [place] 154-161/81-87 , Wed 143-167/81-85  , Thursday 138/80  Has not taken medications this AM   Leg numbness has resolved   Review Of Systems:  GEN- denies fatigue, fever, weight loss,weakness, recent illness HEENT- denies eye drainage, change in vision, nasal discharge, CVS- denies chest pain, palpitations RESP- denies SOB, cough, wheeze ABD- denies N/V, change in stools, abd pain GU- denies dysuria, hematuria, dribbling, incontinence MSK- denies joint pain, muscle aches, injury Neuro- denies headache, dizziness, syncope, seizure activity       Objective:    BP (!) 146/80   Pulse 88   Temp 98.2 F (36.8 C) (Oral)   Resp 14   Ht 5\' 1"  (1.549 m)   Wt 210 lb (95.3 kg)   SpO2 97%   BMI 39.68 kg/m  GEN- NAD, alert and oriented x3,well appearing  HEENT- PERRL, EOMI, non injected sclera, pink conjunctiva, MMM, oropharynx clear Neck- Supple, no thyromegaly CVS- RRR, no murmur RESP-CTAB ABD-NABS,soft,NT,ND NEURO-CNII-XII in tact, no focal deficits  EXT- No edema Pulses- Radial, DP- 2+        Assessment & Plan:      Problem List Items Addressed  This Visit      Unprioritized   Essential hypertension, benign - Primary    Blood pressure is improving now that she is back on her medications.  And will have her continue with meds and she will call me on Tuesday with her readings through the weekend as she will been on her meds for complete week at that time.  Been to recheck her metabolic panel and her magnesium level.  I think some of her stressors and weakness are deftly COVID-19 related but she is on the pill at this point.  She does not have any further paresthesias in her lower extremities her CT scan did not reveal any acute abnormality.  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  We have another follow-up visit via phone in a few weeks for she makes her decision.      Relevant Orders   Basic metabolic panel    Other Visit Diagnoses    Need for immunization against influenza       Relevant Orders   Flu Vaccine QUAD High Dose(Fluad) (Completed)   Low magnesium level       Relevant Orders   Magnesium  Note: This dictation was prepared with Dragon dictation along with smaller phrase technology. Any transcriptional errors that result from this process are unintentional.

## 2019-05-18 LAB — BASIC METABOLIC PANEL
BUN/Creatinine Ratio: 21 (calc) (ref 6–22)
BUN: 24 mg/dL (ref 7–25)
CO2: 25 mmol/L (ref 20–32)
Calcium: 10.2 mg/dL (ref 8.6–10.4)
Chloride: 104 mmol/L (ref 98–110)
Creat: 1.16 mg/dL — ABNORMAL HIGH (ref 0.60–0.93)
Glucose, Bld: 90 mg/dL (ref 65–99)
Potassium: 4.3 mmol/L (ref 3.5–5.3)
Sodium: 141 mmol/L (ref 135–146)

## 2019-05-18 LAB — MAGNESIUM: Magnesium: 1.6 mg/dL (ref 1.5–2.5)

## 2019-05-21 ENCOUNTER — Telehealth: Payer: Self-pay | Admitting: *Deleted

## 2019-05-21 MED ORDER — AMLODIPINE BESYLATE 5 MG PO TABS: 5.0000 mg | ORAL_TABLET | Freq: Every day | ORAL | 3 refills | Status: DC

## 2019-05-21 NOTE — Telephone Encounter (Signed)
Received call from patient daughter, Madaline Savage.   Reports that patient BP readings are as follows:  AM Noon PM  18-Sep 156/66 149/85 157/78  19-Sep 150/71 143/72 146/85  20-Sep 164/91 163/86 172/91  21-Sep 158/92 150/86 168/77  22-Sep 155/82    States that on 9/20, patient felt very dizzy after lunch. BP checked and noted at 201/98.  Reports that patient is taking Maxide as directed.   MD please advise.

## 2019-05-21 NOTE — Telephone Encounter (Signed)
Add norvasc 5mg  once a day to meds Call in 1 week with BP readings

## 2019-05-21 NOTE — Telephone Encounter (Signed)
Call placed to patient and patient made aware.   Prescription sent to pharmacy.  

## 2019-05-29 ENCOUNTER — Telehealth: Payer: Self-pay | Admitting: *Deleted

## 2019-05-29 MED ORDER — AMLODIPINE BESYLATE 10 MG PO TABS: 10.0000 mg | ORAL_TABLET | Freq: Every day | ORAL | 0 refills | Status: DC

## 2019-05-29 NOTE — Telephone Encounter (Signed)
Received call from patient daughter, Madaline Savage.   Reports that patient BP are as follows:  AM Noon PM  23-Sep 190/88 136/104 165/80  24-Sep 177/77 130/86 168/73  25-Sep 178/84 147/73 164/79  26-Sep 158/79 158/81 153/73  27-Sep 124/69 184/81 148/76  28-Sep 167/87 168/73 173/83  29-Sep 144/74 168/76 159/86  30-Sep 150/75     Patient is currently taking Amlodipine 5mg  and Maxide 37.5-25mg .

## 2019-05-29 NOTE — Telephone Encounter (Signed)
Call placed to patient and patient made aware.   OV scheduled.

## 2019-05-29 NOTE — Telephone Encounter (Signed)
Increase norvasc to 10mg  once a day  Schedule OV in 2 weeks for hypertension

## 2019-06-10 ENCOUNTER — Encounter: Payer: Self-pay | Admitting: Family Medicine

## 2019-06-10 ENCOUNTER — Other Ambulatory Visit: Payer: Self-pay

## 2019-06-10 ENCOUNTER — Ambulatory Visit (INDEPENDENT_AMBULATORY_CARE_PROVIDER_SITE_OTHER): Payer: Medicare Other | Admitting: Family Medicine

## 2019-06-10 VITALS — BP 138/72 | HR 94 | Temp 98.3°F | Resp 16 | Ht 61.0 in | Wt 200.0 lb

## 2019-06-10 DIAGNOSIS — Z8616 Personal history of COVID-19: Secondary | ICD-10-CM

## 2019-06-10 DIAGNOSIS — I10 Essential (primary) hypertension: Secondary | ICD-10-CM

## 2019-06-10 DIAGNOSIS — Z8619 Personal history of other infectious and parasitic diseases: Secondary | ICD-10-CM

## 2019-06-10 MED ORDER — METOPROLOL SUCCINATE ER 25 MG PO TB24: 25.0000 mg | ORAL_TABLET | Freq: Every day | ORAL | 3 refills | Status: DC

## 2019-06-10 NOTE — Patient Instructions (Addendum)
Take 1/2 tablet of metoprolol 25mg  once a day Take the Maxzide We will call with lab results Call Thursday or Friday with  Blood pressure results F/U 4 weeks

## 2019-06-10 NOTE — Progress Notes (Signed)
Subjective:    Patient ID: [name], female    DOB: October 08, 1940, 78 y.o.   MRN: [id number]  Patient presents for F/U BP (continues to fluctuate) and Work Leave ([place])  Pt here to f/u her blood pressure  since her diagnosis with COVID, her BP and HR has been up since the end of August.  She was started on amlodipine in addition to her Maxide this was titrated to the max 10 mg and her blood pressure is still up and down.  Her heart rate is also been going up at times.  She still gets episodes where she feels weak and has no appetite would lay around the bit of the time she feels good and is up and moving about.  She denies any shortness of breath no chest pain.  Denies any dizzy spells.  She has been recording her blood pressure BP this weekend 150-190/67-93 HR 90-115  she ran out of the Norvasc 10mg  yesterday  today only took maxizde this morning   She also wants to discuss regarding her working.  She does not feel comfortable going back to work but is not ready to quite retire  Review Of Systems:  GEN- denies fatigue, fever, weight loss,weakness, recent illness HEENT- denies eye drainage, change in vision, nasal discharge, CVS- denies chest pain, palpitations RESP- denies SOB, cough, wheeze ABD- denies N/V, change in stools, abd pain GU- denies dysuria, hematuria, dribbling, incontinence MSK- denies joint pain, muscle aches, injury Neuro- denies headache, dizziness, syncope, seizure activity       Objective:    BP 138/72   Pulse 94   Temp 98.3 F (36.8 C) (Oral)   Resp 16   Ht 5\' 1"  (1.549 m)   Wt 200 lb (90.7 kg)   SpO2 95%   BMI 37.79 kg/m  GEN- NAD, alert and oriented x3 HEENT- PERRL, EOMI, non injected sclera, pink conjunctiva, MMM, oropharynx clear Neck- Supple, no thyromegaly, o bruit  CVS- RRR, no murmur RESP-CTAB ABD-NABS,soft,NT,ND EXT- No edema Pulses- Radial, DP- 2+  EKG-NSR, atrial enlargement, poor r wave, compared to Sept no change       Assessment & Plan:      Problem List Items Addressed This Visit      Unprioritized   Essential hypertension, benign - Primary    Her EKG is unchanged.  She has not had any chest pain or shortness of breath just still with vague weakness and fatigue which she has had on and off since her infection in August.  At her age and with this being a novel thyroid is unclear how this will affect her long-term.  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  I recommend that she stay out through the end of flu season so have given her a letter with regards to being reevaluated to work May 1.  With regards to her blood pressure and the mild tachycardia we will check a TSH on her we will also recheck her renal function and CBC.  We will start her on metoprolol we will start at 12.5 mg once a day and titrate up as needed.  Continue the Maxide.  If her blood pressure does not respond to these changes I will been call cardiology for consultation      Relevant Medications   metoprolol succinate (TOPROL-XL) 25 MG 24 hr tablet   Other Relevant Orders  EKG 12-Lead (Completed)   Basic metabolic panel   TSH   CBC with Differential/Platelet    Other Visit Diagnoses    History of 2019 novel coronavirus disease (COVID-19)          Note: This dictation was prepared with Dragon dictation along with smaller phrase technology. Any transcriptional errors that result from this process are unintentional.

## 2019-06-10 NOTE — Assessment & Plan Note (Signed)
Her EKG is unchanged.  She has not had any chest pain or shortness of breath just still with vague weakness and fatigue which she has had on and off since her infection in August.  At her age and with this being a novel thyroid is unclear how this will affect her long-term.  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  I recommend that she stay out through the end of flu season so have given her a letter with regards to being reevaluated to work May 1.  With regards to her blood pressure and the mild tachycardia we will check a TSH on her we will also recheck her renal function and CBC.  We will start her on metoprolol we will start at 12.5 mg once a day and titrate up as needed.  Continue the Maxide.  If her blood pressure does not respond to these changes I will been call cardiology for consultation

## 2019-06-11 LAB — CBC WITH DIFFERENTIAL/PLATELET
Absolute Monocytes: 442 cells/uL (ref 200–950)
Basophils Absolute: 19 cells/uL (ref 0–200)
Basophils Relative: 0.3 %
Eosinophils Absolute: 83 cells/uL (ref 15–500)
Eosinophils Relative: 1.3 %
HCT: 40.3 % (ref 35.0–45.0)
Hemoglobin: 13.5 g/dL (ref 11.7–15.5)
Lymphs Abs: 2406 cells/uL (ref 850–3900)
MCH: 33.6 pg — ABNORMAL HIGH (ref 27.0–33.0)
MCHC: 33.5 g/dL (ref 32.0–36.0)
MCV: 100.2 fL — ABNORMAL HIGH (ref 80.0–100.0)
MPV: 11.1 fL (ref 7.5–12.5)
Monocytes Relative: 6.9 %
Neutro Abs: 3450 cells/uL (ref 1500–7800)
Neutrophils Relative %: 53.9 %
Platelets: 277 10*3/uL (ref 140–400)
RBC: 4.02 10*6/uL (ref 3.80–5.10)
RDW: 11.1 % (ref 11.0–15.0)
Total Lymphocyte: 37.6 %
WBC: 6.4 10*3/uL (ref 3.8–10.8)

## 2019-06-11 LAB — BASIC METABOLIC PANEL
BUN/Creatinine Ratio: 18 (calc) (ref 6–22)
BUN: 24 mg/dL (ref 7–25)
CO2: 27 mmol/L (ref 20–32)
Calcium: 10.9 mg/dL — ABNORMAL HIGH (ref 8.6–10.4)
Chloride: 102 mmol/L (ref 98–110)
Creat: 1.3 mg/dL — ABNORMAL HIGH (ref 0.60–0.93)
Glucose, Bld: 88 mg/dL (ref 65–99)
Potassium: 4.1 mmol/L (ref 3.5–5.3)
Sodium: 140 mmol/L (ref 135–146)

## 2019-06-11 LAB — TSH: TSH: 1.16 mIU/L (ref 0.40–4.50)

## 2019-06-13 ENCOUNTER — Telehealth: Payer: Self-pay | Admitting: *Deleted

## 2019-06-13 NOTE — Telephone Encounter (Signed)
Received call from patient daughter Madaline Savage.   Reports that patient is voicing C/O dizziness and BP is currently 110/60. States that she feels the Metoprolol 12.5mg  is too much medication for her mother. States that BP noted at 119/62 prior to medication administration.   Advised to have patient rest and move slowly when getting up. Advised to push fluids to increase BP again. Advised to hold Metoprolol in AM.   MD please advise.

## 2019-06-14 NOTE — Telephone Encounter (Signed)
If her BP does not go down < 140/90, HR less than 90, then I want her to take maxide again in the morning  And try taking the Metoprolol 12.5mg  in the evening to separate them Call us in 1 week with readings

## 2019-06-14 NOTE — Telephone Encounter (Signed)
Call placed to patient and patient and patient daughter made aware.

## 2019-06-14 NOTE — Telephone Encounter (Signed)
Call placed to patient.   Reports that current BP is 154/74, HR 80. States that she did not take any medication this morning. Reports no further Sx of dizziness.   Reports that she did stop the Norvasc when directed.   Advised to take Maxide HCTZ at this time.   MD to be made aware.

## 2019-06-14 NOTE — Telephone Encounter (Signed)
Please call and clarify make sure she did stop the norvasc  She was suppose to take maxide and the metoprolol only See what BP is today, and HR

## 2019-06-25 ENCOUNTER — Telehealth: Payer: Self-pay | Admitting: *Deleted

## 2019-06-25 NOTE — Telephone Encounter (Signed)
Call placed to patient and patient and patient daughter [name] made aware.

## 2019-06-25 NOTE — Telephone Encounter (Signed)
Received call from patient daughter, [name].   Reports that patient BP readings are as follows:  AM Noon PM  13-Oct 147/88 154/77 137/69  14-Oct 157/88 130/71 154/74  15-Oct 119/66 100/60 130/70  16-Oct 150/81 154/81 165/81  17-Oct 148/82 159/84 164/78  18-Oct 180/88 168/79 157/77  19-Oct 171/83 164/78 156/81  20-Oct 166/86 147/76 157/76  21-Oct 160/84 151/82 162/87  22-Oct 181/89 151/89 177/89  23-Oct 157/98 142/82 192/95  24-Oct 164/91 134/71 151/77  25-Oct 150/69 145/77 148/68  26-Oct 148/64 135/62 140/76  27-Oct 126/61      States that patient is taking Maxide 37.5- 25mg  Q AM and Metoprolol 12.5mg  Q PM.   MD please advise.

## 2019-06-25 NOTE — Telephone Encounter (Signed)
No changes, she only needs to check BP once a day for now

## 2019-07-08 ENCOUNTER — Encounter: Payer: Self-pay | Admitting: Family Medicine

## 2019-07-08 ENCOUNTER — Ambulatory Visit (INDEPENDENT_AMBULATORY_CARE_PROVIDER_SITE_OTHER): Payer: Medicare Other | Admitting: Family Medicine

## 2019-07-08 ENCOUNTER — Other Ambulatory Visit: Payer: Self-pay

## 2019-07-08 VITALS — BP 130/80 | HR 83 | Temp 98.9°F | Resp 15 | Ht 61.0 in | Wt 205.0 lb

## 2019-07-08 DIAGNOSIS — E559 Vitamin D deficiency, unspecified: Secondary | ICD-10-CM | POA: Diagnosis not present

## 2019-07-08 DIAGNOSIS — I10 Essential (primary) hypertension: Secondary | ICD-10-CM

## 2019-07-08 NOTE — Patient Instructions (Addendum)
Okay to check blood pressure three times a week No change to medications Change F/U to Feb WELLNESS VISIT  Cancel the December

## 2019-07-08 NOTE — Assessment & Plan Note (Signed)
History of vitamin D deficiency with also elevated calcium which is been intermittent.  We will recheck her calcium level today along with vitamin D and PTH. She is not on any calcium supplementation at this time

## 2019-07-08 NOTE — Progress Notes (Signed)
   Subjective:    Patient ID: [name], female    DOB: 05/28/1941, 77 y.o.   MRN: [id number]  Patient presents for Hypertension  Pt here to f/u blood pressure   Taking Maxide 37.5-25mg , and metoprolol 12.5mg  once a day  Bp yesterday 117/59 ,  Past 2 weeks range  126-150/60-70  HR 77-84 She does feel much better overall.  States that she occasionally gets some of those episodes where she feels fatigued but those have improved.  She does admit that she gets anxious and stressed especially thinking about work and that makes her blood pressure go up   Last Creatinine 1.3 in 4 weeks ago, calcium 10.9 sHe is not on any calcium supplements.  No new concerns  Reviewed meds she is otherwise taking her gout pill as well as her Protonix and her allergy medication  Review Of Systems:  GEN- denies fatigue, fever, weight loss,weakness, recent illness HEENT- denies eye drainage, change in vision, nasal discharge, CVS- denies chest pain, palpitations RESP- denies SOB, cough, wheeze ABD- denies N/V, change in stools, abd pain GU- denies dysuria, hematuria, dribbling, incontinence MSK- denies joint pain, muscle aches, injury Neuro- denies headache, dizziness, syncope, seizure activity       Objective:    BP 130/80   Pulse 83   Temp 98.9 F (37.2 C) (Oral)   Resp 15   Ht 5\' 1"  (1.549 m)   Wt 205 lb (93 kg)   SpO2 96%   BMI 38.73 kg/m  GEN- NAD, alert and oriented x3 HEENT- PERRL, EOMI, non injected sclera, pink conjunctiva, MMM, oropharynx clear Neck- Supple, no thyromegaly CVS- RRR, no murmur RESP-CTAB EXT- No edema Pulses- Radial, DP- 2+        Assessment & Plan:      Problem List Items Addressed This Visit      Unprioritized   Essential hypertension, benign - Primary    Blood pressures in much improved at home.  No change to her current medications.  Did discuss with her again that I think it is safe for her to remain out of work at this time.  She is in  agreement.      Relevant Orders   Basic metabolic panel   Vitamin D deficiency    History of vitamin D deficiency with also elevated calcium which is been intermittent.  We will recheck her calcium level today along with vitamin D and PTH. She is not on any calcium supplementation at this time      Relevant Orders   Vitamin D, 25-hydroxy    Other Visit Diagnoses    Hypercalcemia       Relevant Orders   PTH, Intact and Calcium      Note: This dictation was prepared with Dragon dictation along with smaller phrase technology. Any transcriptional errors that result from this process are unintentional.

## 2019-07-08 NOTE — Assessment & Plan Note (Signed)
Blood pressures in much improved at home.  No change to her current medications.  Did discuss with her again that I think it is safe for her to remain out of work at this time.  She is in agreement.

## 2019-07-09 LAB — BASIC METABOLIC PANEL
BUN/Creatinine Ratio: 21 (calc) (ref 6–22)
BUN: 22 mg/dL (ref 7–25)
CO2: 26 mmol/L (ref 20–32)
Calcium: 10.3 mg/dL (ref 8.6–10.4)
Chloride: 107 mmol/L (ref 98–110)
Creat: 1.06 mg/dL — ABNORMAL HIGH (ref 0.60–0.93)
Glucose, Bld: 82 mg/dL (ref 65–99)
Potassium: 4.1 mmol/L (ref 3.5–5.3)
Sodium: 144 mmol/L (ref 135–146)

## 2019-07-09 LAB — VITAMIN D 25 HYDROXY (VIT D DEFICIENCY, FRACTURES): Vit D, 25-Hydroxy: 31 ng/mL (ref 30–100)

## 2019-07-09 LAB — PTH, INTACT AND CALCIUM
Calcium: 10.5 mg/dL — ABNORMAL HIGH (ref 8.6–10.4)
PTH: 42 pg/mL (ref 14–64)

## 2019-07-19 ENCOUNTER — Telehealth: Payer: Self-pay | Admitting: *Deleted

## 2019-07-19 MED ORDER — ONDANSETRON 4 MG PO TBDP: 4.0000 mg | ORAL_TABLET | Freq: Four times a day (QID) | ORAL | 0 refills | Status: DC | PRN

## 2019-07-19 NOTE — Telephone Encounter (Signed)
Received call from patient daughter Madaline Savage.   Reports that patient has x3 days of nausea. States that she has not been eating as she has not appetite. Denies vomiting, diarrhea, fever, cough.  Prescription sent to pharmacy for Zofran. Advised to push fluids and bland diet as tolerated. Advised if Sx do not improve by Monday, 07/22/2019, contact office for telehealth visit.

## 2019-07-19 NOTE — Telephone Encounter (Signed)
Agree with below, if symptoms worsen, abd pain, go to ER

## 2019-08-19 ENCOUNTER — Encounter: Payer: Medicare Other | Admitting: Family Medicine

## 2019-08-21 ENCOUNTER — Encounter (HOSPITAL_COMMUNITY): Payer: Self-pay | Admitting: Emergency Medicine

## 2019-08-21 ENCOUNTER — Emergency Department (HOSPITAL_COMMUNITY)
Admission: EM | Admit: 2019-08-21 | Discharge: 2019-08-21 | Disposition: A | Discharge status: Home / Self Care | Payer: Medicare Other | Attending: Emergency Medicine | Admitting: Emergency Medicine

## 2019-08-21 ENCOUNTER — Telehealth: Payer: Self-pay | Admitting: *Deleted

## 2019-08-21 ENCOUNTER — Other Ambulatory Visit: Payer: Self-pay

## 2019-08-21 DIAGNOSIS — R110 Nausea: Secondary | ICD-10-CM | POA: Diagnosis not present

## 2019-08-21 DIAGNOSIS — Z5321 Procedure and treatment not carried out due to patient leaving prior to being seen by health care provider: Secondary | ICD-10-CM | POA: Insufficient documentation

## 2019-08-21 DIAGNOSIS — I959 Hypotension, unspecified: Secondary | ICD-10-CM | POA: Diagnosis present

## 2019-08-21 NOTE — ED Triage Notes (Signed)
Patient reports low BP today. Reading 106/63. Did not take her BP med today. 139/77 now.

## 2019-08-21 NOTE — ED Triage Notes (Signed)
Patient reports nausea x 2 weeks. Was seen by PCP and given medication.

## 2019-08-21 NOTE — Telephone Encounter (Signed)
Received call from patient daughter, Madaline Savage.   Reports that patient has had x3 days of malaise, weakness and loss of appetite. States that patient has been having lower BP readings as well. Reports that range of BP reading is 120- 140/ 60-80 in AM. States that at night though, diastolic BP drops to [id number]. Reports that she has been holding her Metoprolol as BP has been too low.   Reports that BP today noted at 106/63. Patient has not taken Maxide this AM, or Metoprolol last night.   Denies fever, SOB, cough.   Advised to hold Maxide at this time. Advised to go to ER for evaluation.

## 2019-09-04 ENCOUNTER — Telehealth: Payer: Self-pay | Admitting: *Deleted

## 2019-09-04 NOTE — Telephone Encounter (Signed)
Received call from patient daughter Madaline Savage.   Reports that patient BP readings are as follows:  AM PM  1-Jan 151/72 151/56  2-Jan 146/59 149/74  3-Jan 158/71 157/77  4-Jan 152/54   5-Jan 159/68 136/67  6-Jan 137/65 113/46   Reports that systolic BP remains elevated. States that she hold Metoprolol if diastolic is [id number], but continues Maxzide as directed.

## 2019-09-05 NOTE — Telephone Encounter (Signed)
Received call from patient.   Reports that she has separated her BP meds and is only taking 0.5 tab of Metoprolol.   States that BP continues to fluctuate.

## 2019-09-05 NOTE — Telephone Encounter (Signed)
Blood pressures are up and down , separate the 2 pills She needs to take the metoprolol 12.5mg  in the evening Take maxizde in the morning Only needs to check blood pressure once a day, unless she feels bad

## 2019-09-06 NOTE — Telephone Encounter (Signed)
Increase to the entire tablet of metoprolol, take every day Separate them Record BP

## 2019-09-06 NOTE — Telephone Encounter (Signed)
Call placed to patient and patient made aware.  

## 2019-09-16 ENCOUNTER — Ambulatory Visit (INDEPENDENT_AMBULATORY_CARE_PROVIDER_SITE_OTHER): Payer: Medicare Other | Admitting: Family Medicine

## 2019-09-16 ENCOUNTER — Encounter: Payer: Self-pay | Admitting: Family Medicine

## 2019-09-16 ENCOUNTER — Other Ambulatory Visit: Payer: Self-pay

## 2019-09-16 VITALS — BP 142/78 | HR 78 | Temp 99.5°F | Resp 14 | Ht 61.0 in | Wt 200.0 lb

## 2019-09-16 DIAGNOSIS — E785 Hyperlipidemia, unspecified: Secondary | ICD-10-CM

## 2019-09-16 DIAGNOSIS — I10 Essential (primary) hypertension: Secondary | ICD-10-CM | POA: Diagnosis not present

## 2019-09-16 LAB — COMPREHENSIVE METABOLIC PANEL
AG Ratio: 1.3 (calc) (ref 1.0–2.5)
ALT: 12 U/L (ref 6–29)
AST: 31 U/L (ref 10–35)
Albumin: 4.2 g/dL (ref 3.6–5.1)
Alkaline phosphatase (APISO): 65 U/L (ref 37–153)
BUN/Creatinine Ratio: 22 (calc) (ref 6–22)
BUN: 27 mg/dL — ABNORMAL HIGH (ref 7–25)
CO2: 28 mmol/L (ref 20–32)
Calcium: 10.1 mg/dL (ref 8.6–10.4)
Chloride: 103 mmol/L (ref 98–110)
Creat: 1.22 mg/dL — ABNORMAL HIGH (ref 0.60–0.93)
Globulin: 3.3 g/dL (calc) (ref 1.9–3.7)
Glucose, Bld: 86 mg/dL (ref 65–99)
Potassium: 4.6 mmol/L (ref 3.5–5.3)
Sodium: 140 mmol/L (ref 135–146)
Total Bilirubin: 0.7 mg/dL (ref 0.2–1.2)
Total Protein: 7.5 g/dL (ref 6.1–8.1)

## 2019-09-16 LAB — CBC WITH DIFFERENTIAL/PLATELET
Absolute Monocytes: 586 cells/uL (ref 200–950)
Basophils Absolute: 29 cells/uL (ref 0–200)
Basophils Relative: 0.5 %
Eosinophils Absolute: 81 cells/uL (ref 15–500)
Eosinophils Relative: 1.4 %
HCT: 40 % (ref 35.0–45.0)
Hemoglobin: 13.3 g/dL (ref 11.7–15.5)
Lymphs Abs: 2291 cells/uL (ref 850–3900)
MCH: 33.4 pg — ABNORMAL HIGH (ref 27.0–33.0)
MCHC: 33.3 g/dL (ref 32.0–36.0)
MCV: 100.5 fL — ABNORMAL HIGH (ref 80.0–100.0)
MPV: 10.4 fL (ref 7.5–12.5)
Monocytes Relative: 10.1 %
Neutro Abs: 2813 cells/uL (ref 1500–7800)
Neutrophils Relative %: 48.5 %
Platelets: 281 10*3/uL (ref 140–400)
RBC: 3.98 10*6/uL (ref 3.80–5.10)
RDW: 10.9 % — ABNORMAL LOW (ref 11.0–15.0)
Total Lymphocyte: 39.5 %
WBC: 5.8 10*3/uL (ref 3.8–10.8)

## 2019-09-16 LAB — LIPID PANEL
Cholesterol: 199 mg/dL (ref <200)
HDL: 71 mg/dL (ref >=50)
LDL Cholesterol (Calc): 114 mg/dL (calc) — ABNORMAL HIGH
Non-HDL Cholesterol (Calc): 128 mg/dL (calc) (ref <130)
Total CHOL/HDL Ratio: 2.8 (calc) (ref <5.0)
Triglycerides: 56 mg/dL (ref <150)

## 2019-09-16 MED ORDER — AMLODIPINE BESYLATE 5 MG PO TABS: 5.0000 mg | ORAL_TABLET | Freq: Every day | ORAL | 3 refills | Status: DC

## 2019-09-16 NOTE — Patient Instructions (Addendum)
Stop the metoprolol Take the maxide in the morning Restart norvasc 5mg  once a day  We will call with lab results F/U as previous

## 2019-09-16 NOTE — Assessment & Plan Note (Signed)
Had difficulty controlling her blood pressure since she had COVID-19 infection.  She is having dizzy spells with her metoprolol so we will discontinue the beta-blocker.  We will have her continue with the Maxide and restart amlodipine at 5 mg once a day.  I will check her metabolic panel today as well as her lipid panel.  Neurologically she is intact and looks at her baseline.  She is not had any significant weight loss either.  She will follow-up in a few weeks for her wellness visit we will recheck her blood pressure.  If I continue to to have difficulties keeping her blood pressure under control, I will get her scheduled with cardiology.

## 2019-09-16 NOTE — Progress Notes (Signed)
   Subjective:    Patient ID: [name], female    DOB: Nov 09, 1940, 79 y.o.   MRN: [id number]  Patient presents for Vertigo (x2 weeks- when dizzy, BP lower than 150) and Medication Management (continues Maxide in AM, but is holding Toprol when BP low)  Pt here to f/u her blood pressure. Her blood pressure has been up and down She has been dizzy at tims She is taking maxizide in the morning and toprol 12.5mg  in the evening  She called in with her bp readings, they have been 130-150/ 60-80,she had 1 episode of  113/46 and she felt bad She will often hold the metoprolol when she feels bad and this helps.  Yesterday blood pressure was 131/68 She has has random spells of being dizzy, when she closes her eyes, has mild episode, and a little sinus pressure.  She has been using the flonase  No chest pain no SOB Appetite is improved. She gets mild swelling in the left leg, but by morning it has resolved    Review Of Systems:  GEN- denies fatigue, fever, weight loss,weakness, recent illness HEENT- denies eye drainage, change in vision, nasal discharge, CVS- denies chest pain, palpitations RESP- denies SOB, cough, wheeze ABD- denies N/V, change in stools, abd pain GU- denies dysuria, hematuria, dribbling, incontinence MSK- denies joint pain, muscle aches, injury Neuro- denies headache, +dizziness, syncope, seizure activity       Objective:    BP (!) 142/78   Pulse 78   Temp 99.5 F (37.5 C) (Temporal)   Resp 14   Ht 5\' 1"  (1.549 m)   Wt 200 lb (90.7 kg)   SpO2 97%   BMI 37.79 kg/m  GEN- NAD, alert and oriented x3, well-appearing HEENT- PERRL, EOMI, non injected sclera, pink conjunctiva, TM clear bilaterally no effusion Neck- Supple, no thyromegaly, no bruit CVS- RRR, no murmur RESP-CTAB Neuro cranial nerves II through XII intact no focal deficits EXT- No edema Pulses- Radial, DP- 2+        Assessment & Plan:      Problem List Items Addressed This Visit       Unprioritized   Essential hypertension, benign - Primary    Had difficulty controlling her blood pressure since she had COVID-19 infection.  She is having dizzy spells with her metoprolol so we will discontinue the beta-blocker.  We will have her continue with the Maxide and restart amlodipine at 5 mg once a day.  I will check her metabolic panel today as well as her lipid panel.  Neurologically she is intact and looks at her baseline.  She is not had any significant weight loss either.  She will follow-up in a few weeks for her wellness visit we will recheck her blood pressure.  If I continue to to have difficulties keeping her blood pressure under control, I will get her scheduled with cardiology.      Relevant Medications   amLODipine (NORVASC) 5 MG tablet   Other Relevant Orders   CBC with Differential   Comprehensive metabolic panel   Mild hyperlipidemia   Relevant Medications   amLODipine (NORVASC) 5 MG tablet   Other Relevant Orders   Lipid Panel      Note: This dictation was prepared with Dragon dictation along with smaller phrase technology. Any transcriptional errors that result from this process are unintentional.

## 2019-09-25 ENCOUNTER — Encounter: Payer: Self-pay | Admitting: Family Medicine

## 2019-09-27 ENCOUNTER — Other Ambulatory Visit: Payer: Self-pay | Admitting: *Deleted

## 2019-09-27 MED ORDER — PANTOPRAZOLE SODIUM 40 MG PO TBEC: 40.0000 mg | DELAYED_RELEASE_TABLET | Freq: Every day | ORAL | 11 refills | Status: DC

## 2019-09-30 ENCOUNTER — Other Ambulatory Visit: Payer: Self-pay

## 2019-09-30 ENCOUNTER — Ambulatory Visit (INDEPENDENT_AMBULATORY_CARE_PROVIDER_SITE_OTHER): Payer: Medicare Other | Admitting: Family Medicine

## 2019-09-30 ENCOUNTER — Encounter: Payer: Self-pay | Admitting: Family Medicine

## 2019-09-30 VITALS — BP 132/76 | HR 62 | Temp 98.2°F | Resp 14 | Ht 61.0 in | Wt 200.0 lb

## 2019-09-30 DIAGNOSIS — F4321 Adjustment disorder with depressed mood: Secondary | ICD-10-CM | POA: Diagnosis not present

## 2019-09-30 DIAGNOSIS — Z0000 Encounter for general adult medical examination without abnormal findings: Secondary | ICD-10-CM

## 2019-09-30 DIAGNOSIS — K219 Gastro-esophageal reflux disease without esophagitis: Secondary | ICD-10-CM | POA: Diagnosis not present

## 2019-09-30 DIAGNOSIS — I10 Essential (primary) hypertension: Secondary | ICD-10-CM

## 2019-09-30 DIAGNOSIS — E785 Hyperlipidemia, unspecified: Secondary | ICD-10-CM

## 2019-09-30 DIAGNOSIS — M1A00X0 Idiopathic chronic gout, unspecified site, without tophus (tophi): Secondary | ICD-10-CM

## 2019-09-30 DIAGNOSIS — K5900 Constipation, unspecified: Secondary | ICD-10-CM | POA: Diagnosis not present

## 2019-09-30 MED ORDER — POLYETHYLENE GLYCOL 3350 17 GM/SCOOP PO POWD: 17.0000 g | Freq: Every day | ORAL | 1 refills | Status: AC | PRN

## 2019-09-30 NOTE — Patient Instructions (Signed)
F/U 4 months  

## 2019-09-30 NOTE — Progress Notes (Signed)
Subjective:   Patient presents for Medicare Annual/Subsequent preventive examination.   HTN- taking maxzide and additional norvasc 5mg , BP at home  120-140/50-70 HR  70-90  Gout- rare flares , she is on allopurinol    OA- on naprosyn as needed / voltaren gel   GERD- protonix daily helps control most of symptoms,. 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 HTN  Depression Screen  (Note: if answer to either of the following is "Yes", a more complete depression screening is indicated)  Over the past two weeks, have you felt down, depressed or hopeless? No Over the past two weeks, have you felt little interest or pleasure in doing things? No Have you lost interest or pleasure in daily life? No Do you often feel hopeless? No Do you cry easily over simple problems? No   Activities of Daily Living  In your present state of health, do you have any difficulty performing the following activities?:  Driving? No  Managing money? No  Feeding yourself? No  Getting from bed to chair? No  Climbing a flight of stairs? No  Preparing food and eating?: No  Bathing or showering? No  Getting dressed: No  Getting to the toilet? No  Using the toilet:No  Moving around from place to place: No  In the past year have you fallen or had a near fall?:No  Are you sexually active? No  Do you have more than one partner? No   Hearing Difficulties: No  Do you often ask people to speak up or repeat themselves? No  Do you experience ringing or noises in your ears? No Do you have difficulty understanding soft or whispered voices? No  Do you feel that you have a  problem with memory? No Do you often misplace items? No  Do you feel safe at home? Yes  Cognitive Testing  Alert? Yes Normal Appearance?Yes  Oriented to person? Yes Place? Yes  Time? Yes  Recall of three objects? Yes  Can perform simple calculations? Yes  Displays appropriate judgment?Yes  Can read the correct time from a watch face?Yes   List the Names of Other Physician/Practitioners you currently use:   Dr. Luna Glasgow- Orthopedics Dr. Berline Lopes- Podiatry   Eye doctor [facility]    Screening Tests / Date Colonoscopy   Declines  Zostavax Declines Mammogram  UTD, 2020 , every 2 years  Influenza Vaccine UTD Tetanus/tdap Declines Pneumonia- UTD  Bone Density- UTD 2020  Carotid US  2019- normal   ROS: GEN- denies fatigue, fever, weight loss,weakness, recent illness HEENT- denies eye drainage, change in vision, nasal discharge, CVS- denies chest pain, palpitations RESP- denies SOB, +cough, wheeze ABD- denies N/V, change in stools, abd pain GU- denies dysuria, hematuria, dribbling, incontinence MSK- + joint pain, muscle aches, injury Neuro- denies headache, dizziness, syncope, seizure activity  Physical:Vitals reviewed  GEN- NAD, alert and oriented x3 HEENT- PERRL, EOMI, non injected sclera, pink conjunctiva, MMM, oropharynx clear Neck- Supple, no thryomegaly,  CVS- RRR, no murmur RESP-CTAB ABD-NABS,soft,,NT,ND Psych- normal affect and mood  EXT- No edema Pulses- Radial, DP- 2+  Assessment:    Annual wellness medicare exam   Plan:    During the course of the visit the patient was educated and counseled about appropriate screening and preventive services including:   Prevention- declines further TDAP/shingles/Colonoscopy  Mammo every 2 years   Depression screen positive- states her appetite and energy just are the same, sometimes that gets her down, retiring she is just sitting around due to [place]   discussed trying to exercise within her home,  finding a hobby. Adjusting to staying and home and fear of COVID has caused some depression symptoms, no new meds needed at this time  Note no weight loss  HTN- much improved, continue maxize and norvasc 5mg    CKD stage 2- stable, keep hydrated  GERD- continue PPI can take occ tums as needed not daily   Constipation- prn miralax   Gout- controlled with allopurinol, no recent flares   Given handout on advanced directives           Diet review for nutrition referral? Yes ____ Not Indicated __x__  Patient Instructions (the written plan) was given to the patient.  Medicare Attestation  I have personally reviewed:  The patient's medical and social history  Their use of alcohol, tobacco or illicit drugs  Their current medications and supplements  The patient's functional ability including ADLs,fall risks, home safety risks, cognitive, and hearing and visual impairment  Diet and physical activities  Evidence for depression or mood disorders  The patient's weight, height, BMI, and visual acuity have been recorded in the chart. I have made referrals, counseling, and provided education to the patient based on review of the above and I have provided the patient with a written personalized care plan for preventive services.

## 2019-10-15 ENCOUNTER — Other Ambulatory Visit: Payer: Self-pay | Admitting: Family Medicine

## 2019-10-20 ENCOUNTER — Ambulatory Visit: Payer: Medicare Other | Attending: Internal Medicine

## 2019-10-20 DIAGNOSIS — Z23 Encounter for immunization: Secondary | ICD-10-CM | POA: Insufficient documentation

## 2019-10-20 NOTE — Progress Notes (Signed)
   Covid-19 Vaccination Clinic  Name:  BRIANNI GONZALO    MRN: [id number] DOB: Mar 21, 1941  10/20/2019  Ms. Janish was observed post Covid-19 immunization for 15 minutes without incidence. She was provided with Vaccine Information Sheet and instruction to access the V-Safe system.   Ms. Gallik was instructed to call 911 with any severe reactions post vaccine: Marland Kitchen Difficulty breathing  . Swelling of your face and throat  . A fast heartbeat  . A bad rash all over your body  . Dizziness and weakness    Immunizations Administered    Name Date Dose VIS Date Route   Pfizer COVID-19 Vaccine 10/20/2019  8:27 AM 0.3 mL 08/09/2019 Intramuscular   Manufacturer: [place]   Lot: [id number]   [place]: [id number]

## 2019-11-12 ENCOUNTER — Ambulatory Visit: Payer: Medicare Other | Attending: Internal Medicine

## 2019-11-12 DIAGNOSIS — Z23 Encounter for immunization: Secondary | ICD-10-CM

## 2019-11-12 NOTE — Progress Notes (Signed)
   Covid-19 Vaccination Clinic  Name:  [name]    MRN: [id number] DOB: Jan 07, 1941  11/12/2019  [name] was observed post Covid-19 immunization for 15 minutes without incident. She was provided with Vaccine Information Sheet and instruction to access the V-Safe system.   [name] was instructed to call 911 with any severe reactions post vaccine: Marland Kitchen Difficulty breathing  . Swelling of face and throat  . A fast heartbeat  . A bad rash all over body  . Dizziness and weakness   Immunizations Administered    Name Date Dose VIS Date Route   Pfizer COVID-19 Vaccine 11/12/2019  2:45 PM 0.3 mL 08/09/2019 Intramuscular   Manufacturer: [place]   Lot: [id number]   [place]: [id number]

## 2019-12-24 ENCOUNTER — Telehealth: Payer: Self-pay | Admitting: Family Medicine

## 2019-12-24 ENCOUNTER — Other Ambulatory Visit: Payer: Self-pay | Admitting: Family Medicine

## 2019-12-24 DIAGNOSIS — I10 Essential (primary) hypertension: Secondary | ICD-10-CM

## 2019-12-24 DIAGNOSIS — K219 Gastro-esophageal reflux disease without esophagitis: Secondary | ICD-10-CM

## 2019-12-24 DIAGNOSIS — E785 Hyperlipidemia, unspecified: Secondary | ICD-10-CM

## 2019-12-24 NOTE — Progress Notes (Signed)
  Chronic Care Management   Outreach Note  12/24/2019 Name: [name] MRN: [id number] DOB: 12/03/1940  Referred by: Alycia Rossetti, MD Reason for referral : Chronic Care Management (Initial CCM Outreach)   An unsuccessful telephone outreach was attempted today. The patient was referred to the pharmacist for assistance with care management and care coordination.   Follow Up Plan:   [place]

## 2019-12-25 ENCOUNTER — Telehealth: Payer: Self-pay | Admitting: Pharmacist

## 2019-12-25 NOTE — Telephone Encounter (Signed)
A user error has taken place: orders placed in error, not carried out on this patient.

## 2019-12-25 NOTE — Chronic Care Management (AMB) (Signed)
Chronic Care Management Pharmacy  Name: [name]  MRN: [id number] DOB: 1941-01-14  Chief Complaint/ HPI  [name],  79 y.o. , female presents for their Initial CCM visit with the clinical pharmacist via telephone.  PCP : Alycia Rossetti, MD  Their chronic conditions include: hypertension, GERD, osteoarthritis, hyperlipidemia, and gout.  Office Visits:  09/30/19 [facility]) - started prn Miralax for constipation. No other med changes  09/16/19 [facility]) - patient was having dizzy spells with metoprolol so it was d/c, restarted amlodipine 5mg .    07/08/2019 [facility]) - instructed her to check BP 3x weekly, BP controlled no med changes at this visit  Consult Visit:  08/21/19 (ED) - patient presented to ED d/t hypotension.  BP was 125/73.  No changed to meds at this time.  Medications: Outpatient Encounter Medications as of 12/26/2019  Medication Sig  . albuterol (VENTOLIN HFA) 108 (90 Base) MCG/ACT inhaler INHALE TWO PUFFS INTO LUNGS EVERY SIX HOURS AS NEEDED FOR WHEEZING OR SHORTNESS OF BREATH.  Marland Kitchen allopurinol (ZYLOPRIM) 100 MG tablet TAKE 2 TABLETS BY MOUTH EVERY DAY  . amLODipine (NORVASC) 5 MG tablet Take 1 tablet (5 mg total) by mouth daily.  Marland Kitchen aspirin EC 81 MG tablet Take 81 mg by mouth daily.    . diclofenac sodium (VOLTAREN) 1 % GEL Apply 2 g topically 4 (four) times daily. Pain  . fluticasone (FLONASE) 50 MCG/ACT nasal spray Place 2 sprays into both nostrils daily.  Marland Kitchen loratadine (CLARITIN) 10 MG tablet Take 1 tablet (10 mg total) by mouth daily as needed for allergies.  . naproxen (NAPROSYN) 500 MG tablet TAKE 1 TABLET BY MOUTH TWICE DAILY WITH  A  MEAL (Patient taking differently: 2 (two) times daily as needed. )  . pantoprazole (PROTONIX) 40 MG tablet Take 1 tablet (40 mg total) by mouth daily.  . polyethylene glycol powder (GLYCOLAX/MIRALAX) 17 GM/SCOOP powder Take 17 g by mouth daily as needed.  . triamterene-hydrochlorothiazide (MAXZIDE-25) 37.5-25  MG tablet TAKE 1 TABLET BY MOUTH EVERY DAY  . ondansetron (ZOFRAN ODT) 4 MG disintegrating tablet Take 1 tablet (4 mg total) by mouth every 6 (six) hours as needed for nausea or vomiting. (Patient not taking: Reported on 12/26/2019)   No facility-administered encounter medications on file as of 12/26/2019.     Current Diagnosis/Assessment:   Emergency planning/management officer Strain: Low Risk   . Difficulty of Paying Living Expenses: Not very hard     Goals Addressed            This Visit's Progress   . GERD       CARE PLAN ENTRY  Current Barriers:  . Chronic Disease Management support, education, and care coordination needs related to  GERD.  Pharmacist Clinical Goal(s):  Marland Kitchen Over the next 30 days, we will work to try and taper off of pantoprazole as patient is not currently having any symptoms related to acid reflux. Interventions: . Comprehensive medication review performed. . Collaboration with provider on taper off recommendation for PPI.  Patient Self Care Activities:  . Over the next 30 days the patient will taper off of PPI as follows: o Week 1 & 2 - Pantoprazole 40mg  every other day. o Week 3 & 4 - Pantoprazole 40mg  every third day. o After this is complete stop taking medication, report any increased symptoms of acid reflux to providers and treat periodic symptoms with H2 blocker such as Pepcid. . Report any increase or change in symptoms to PharmD or PCP.  Initial goal documentation     . Hyperlipidemia - LDL <100       CARE PLAN ENTRY (see longitudinal plan of care for additional care plan information)  Current Barriers:  . Uncontrolled hyperlipidemia, complicated by hypertension and GERD. . Current antihyperlipidemic regimen: none . Previous antihyperlipidemic medications tried: none . Most recent lipid panel:     Component Value Date/Time   CHOL 199 09/16/2019 0955   TRIG 56 09/16/2019 0955   HDL 71 09/16/2019 0955   CHOLHDL 2.8 09/16/2019 0955   VLDL 12 01/31/2017  0929   LDLCALC 114 (H) 09/16/2019 0955    . ASCVD risk enhancing conditions: age >24, DM, HTN, CKD, CHF, current smoker . 10-year ASCVD risk score: 19.6%  Pharmacist Clinical Goal(s):  Marland Kitchen Over the next 30 days, patient will work with PharmD and providers towards optimized antihyperlipidemic therapy.  Interventions: . Comprehensive medication review performed; medication list updated in electronic medical record.  Bertram Savin care team collaboration (see longitudinal plan of care) . Discussed benefits of regular exercise and diet on cholesterol. . Per Dr. Buelah Manis, [place] to start atorvastatin 10mg  nightly. o Will send in prescription to patient's pharmacy and recheck cholesterol at visit in June.    Patient Self Care Activities:  . Patient will focus on medication adherence by using pill box. . Patient will pick up prescription from pharmacy this weekend. . Over the next 30 days patient will take medication once nightly as prescribed, will report any side effects to PharmD or PCP. Marland Kitchen Patient will return to the office in June for visit with Dr. Buelah Manis to have cholesterol levels rechecked.   Initial goal documentation     . Hypertension - BP < 140/90       CARE PLAN ENTRY (see longitudinal plan of care for additional care plan information)  Current Barriers:  . Uncontrolled hypertension, complicated by hyperlipidemia, GERD. . Current antihypertensive regimen: amlodipine 5mg  daily, triamterene/hctz 37.5-25 mg daily . Previous antihypertensives tried: metoprolol ER 25mg  daily . Last practice recorded BP readings:  BP Readings from Last 3 Encounters:  09/30/19 132/76  09/16/19 (!) 142/78  08/21/19 125/73   . Current home BP readings: Range from 128/68-149/56.  BP at home this morning was 135/65.  Averages in 130s/60s based on at home log.  Pharmacist Clinical Goal(s):  Marland Kitchen Over the next 30 days, patient will work with PharmD and providers to optimize antihypertensive  regimen. o During this time patient will continue to monitor BP at home twice daily and record in log to discuss at future appointments.  Instructed patient to follow up with providers if BP is consistently > 140/90.  Interventions: . Inter-disciplinary care team collaboration (see longitudinal plan of care) . Comprehensive medication review performed; medication list updated in the electronic medical record.  . Recommended patient try to implement 30 minutes of aerobic exercise daily as tolerated.  Start with just a few days per week and work up to daily if tolerated.  Patient Self Care Activities:  . Patient will continue to check BP twice daily at home , document, and provide at future appointments . Patient will focus on medication adherence by pill box. . Over the next 30 days patient will report any consistent BP readings >140/90s to PharmD or PCP.  Initial goal documentation         Hypertension    Office blood pressures are  BP Readings from Last 3 Encounters:  09/30/19 132/76  09/16/19 (!) 142/78  08/21/19 125/73  Patient has failed these meds in the past: metoprolol ER 25mg  daily (dizziness)  Patient is currently uncontrolled on the following medications: amlodipine 5mg  daily every evening, triamterene/hctz 37.5-25 mg daily morning time  Patient checks BP at home twice daily using arm cuff.  Patient home BP readings are ranging: 135/65 this morning Range: 128/68 - 149/56 with three consecutive days in the past week in the 140s.  Lowest reported BP 128/68.  Patient denied dizziness or signs of low blood pressure since stopping metoprolol.  Discussed importance of regular exercise, currently walks at the mall with daughter.  Recommended 30 mins of aerobic exercise daily and patient can taper up to this as tolerated.  Plan  Continue current medications.  Continue to monitor BP and document in log as directed by Dr. Buelah Manis.  If BP continues to trend high may need  additional medication or dose titration of amlodipine in the future.    Hyperlipidemia   Lipid Panel     Component Value Date/Time   CHOL 199 09/16/2019 0955   TRIG 56 09/16/2019 0955   HDL 71 09/16/2019 0955   CHOLHDL 2.8 09/16/2019 0955   VLDL 12 01/31/2017 0929   LDLCALC 114 (H) 09/16/2019 0955     The 10-year ASCVD risk score Mikey Bussing DC Jr., et al., 2013) is: 19.6%   Values used to calculate the score:     Age: 89 years     Sex: Female     Is Non-Hispanic African American: Yes     Diabetic: No     Tobacco smoker: No     Systolic Blood Pressure: [id number] mmHg     Is BP treated: Yes     HDL Cholesterol: 71 mg/dL     Total Cholesterol: 199 mg/dL   Patient has failed these meds in past: none noted Patient is currently uncontrolled on the following medications: none noted  We discussed: the benefits of regular physical activity to control her cholesterol. She goes to the mall to walk with her daughter some, but not on a regular basis. Patient was open to statin if needed to get her to goal LDL < 100.  Plan  Start atorvastatin 10mg  at bedtime per Dr. Buelah Manis.  Will recheck lipids at appointment in June. Will need to check LFTs in 12 weeks from initiation of therapy.  GERD    Patient is currently controlled on the following medications: pantoprazole 40mg  daily  We discussed: Risks associated with long term PPI use including increased risk of fractures.  Patient is open to tapering down PPI and evaluating syptoms, if taper down does not work we will try to decrease dose to 20mg  daily.  Plan  Per Dr. Buelah Manis, [place] to trial taper off of pantoprazole.  Patient taper plan will be as follows:  1. 40mg  every other day for 2 weeks  2. 40mg  every third day for 2 weeks.  3.  Stop medication and evaluate symptoms  Patient will follow up with PharmD or PCP if symptoms return during taper course.  Will follow up with patient on taper after 4 weeks.    Vaccines   Reviewed and discussed  patient's vaccination history.    Immunization History  Administered Date(s) Administered  . Fluad Quad(high Dose 65+) 05/17/2019  . Influenza, High Dose Seasonal PF 08/13/2018  . Influenza,inj,Quad PF,6+ Mos 06/01/2015, 08/02/2016, 06/29/2017  . PFIZER SARS-COV-2 Vaccination 10/20/2019, 11/12/2019  . Pneumococcal Conjugate-13 11/26/2014    Plan  Recommended patient receive Tdap and shingles vaccine in pharmacy.  Medication Management   . Miscellaneous medications: flonase, loratadine, ondansetron 4mg  every 6 hours prn, albuterol inhaler 2 puffs every 6 hours prn . OTC's: aspirin 81 mg daily, miralax 17g daily prn . Patient currently uses [facility] in [place].  Phone #  [phone] . Patient reports using pill box method to organize medications and promote adherence. . Patient reports an average of one to two missed doses per week.  Would benefit from [facility] but uses [facility] due to no partD plan and does not qualify for LIS. Marland Kitchen Cost: Patient reports no issues with cost of medications or other necessities.   [name], PharmD Clinical Pharmacist [place] [phone]

## 2019-12-26 ENCOUNTER — Ambulatory Visit: Payer: Medicare Other | Admitting: Pharmacist

## 2019-12-26 DIAGNOSIS — E785 Hyperlipidemia, unspecified: Secondary | ICD-10-CM

## 2019-12-26 DIAGNOSIS — K219 Gastro-esophageal reflux disease without esophagitis: Secondary | ICD-10-CM

## 2019-12-26 DIAGNOSIS — I10 Essential (primary) hypertension: Secondary | ICD-10-CM

## 2019-12-26 NOTE — Patient Instructions (Addendum)
Thank you for meeting with me today!  I look forward to working with you to help you meet all of your healthcare goals and answer any questions you may have.  Feel free to contact me anytime!  Visit Information  Goals Addressed            This Visit's Progress   . GERD       CARE PLAN ENTRY  Current Barriers:  . Chronic Disease Management support, education, and care coordination needs related to  GERD.  Pharmacist Clinical Goal(s):  Marland Kitchen Over the next 30 days, we will work to try and taper off of pantoprazole as patient is not currently having any symptoms related to acid reflux. Interventions: . Comprehensive medication review performed. . Collaboration with provider on taper off recommendation for PPI.  Patient Self Care Activities:  . Over the next 30 days the patient will taper off of PPI as follows: o Week 1 & 2 - Pantoprazole 40mg  every other day. o Week 3 & 4 - Pantoprazole 40mg  every third day. o After this is complete stop taking medication, report any increased symptoms of acid reflux to providers and treat periodic symptoms with H2 blocker such as Pepcid. . Report any increase or change in symptoms to PharmD or PCP.  Initial goal documentation     . Hyperlipidemia - LDL <100       CARE PLAN ENTRY (see longitudinal plan of care for additional care plan information)  Current Barriers:  . Uncontrolled hyperlipidemia, complicated by hypertension and GERD. . Current antihyperlipidemic regimen: none . Previous antihyperlipidemic medications tried: none . Most recent lipid panel:     Component Value Date/Time   CHOL 199 09/16/2019 0955   TRIG 56 09/16/2019 0955   HDL 71 09/16/2019 0955   CHOLHDL 2.8 09/16/2019 0955   VLDL 12 01/31/2017 0929   LDLCALC 114 (H) 09/16/2019 0955    . ASCVD risk enhancing conditions: age >10, DM, HTN, CKD, CHF, current smoker . 10-year ASCVD risk score: 19.6%  Pharmacist Clinical Goal(s):  Marland Kitchen Over the next 30 days, patient will work  with PharmD and providers towards optimized antihyperlipidemic therapy.  Interventions: . Comprehensive medication review performed; medication list updated in electronic medical record.  Bertram Savin care team collaboration (see longitudinal plan of care) . Discussed benefits of regular exercise and diet on cholesterol. . Per Dr. Buelah Manis, [place] to start atorvastatin 10mg  nightly. o Will send in prescription to patient's pharmacy and recheck cholesterol at visit in June.    Patient Self Care Activities:  . Patient will focus on medication adherence by using pill box. . Patient will pick up prescription from pharmacy this weekend. . Over the next 30 days patient will take medication once nightly as prescribed, will report any side effects to PharmD or PCP. Marland Kitchen Patient will return to the office in June for visit with Dr. Buelah Manis to have cholesterol levels rechecked.   Initial goal documentation     . Hypertension - BP < 140/90       CARE PLAN ENTRY (see longitudinal plan of care for additional care plan information)  Current Barriers:  . Uncontrolled hypertension, complicated by hyperlipidemia, GERD. . Current antihypertensive regimen: amlodipine 5mg  daily, triamterene/hctz 37.5-25 mg daily . Previous antihypertensives tried: metoprolol ER 25mg  daily . Last practice recorded BP readings:  BP Readings from Last 3 Encounters:  09/30/19 132/76  09/16/19 (!) 142/78  08/21/19 125/73   . Current home BP readings: Range from 128/68-149/56.  BP at  home this morning was 135/65.  Averages in 130s/60s based on at home log.  Pharmacist Clinical Goal(s):  Marland Kitchen Over the next 30 days, patient will work with PharmD and providers to optimize antihypertensive regimen. o During this time patient will continue to monitor BP at home twice daily and record in log to discuss at future appointments.  Instructed patient to follow up with providers if BP is consistently >  140/90.  Interventions: . Inter-disciplinary care team collaboration (see longitudinal plan of care) . Comprehensive medication review performed; medication list updated in the electronic medical record.  . Recommended patient try to implement 30 minutes of aerobic exercise daily as tolerated.  Start with just a few days per week and work up to daily if tolerated.  Patient Self Care Activities:  . Patient will continue to check BP twice daily at home , document, and provide at future appointments . Patient will focus on medication adherence by pill box. . Over the next 30 days patient will report any consistent BP readings >140/90s to PharmD or PCP.  Initial goal documentation        Ms. Ramiro was given information about Chronic Care Management services today including:  1. CCM service includes personalized support from designated clinical staff supervised by her physician, including individualized plan of care and coordination with other care providers 2. 24/7 contact phone numbers for assistance for urgent and routine care needs. 3. Standard insurance, coinsurance, copays and deductibles apply for chronic care management only during months in which we provide at least 20 minutes of these services. Most insurances cover these services at 100%, however patients may be responsible for any copay, coinsurance and/or deductible if applicable. This service may help you avoid the need for more expensive face-to-face services. 4. Only one practitioner may furnish and bill the service in a calendar month. 5. The patient may stop CCM services at any time (effective at the end of the month) by phone call to the office staff.  Patient agreed to services and verbal consent obtained.   The patient verbalized understanding of instructions provided today and agreed to receive a mailed copy of patient instruction and/or educational materials. Telephone follow up appointment with pharmacy team member  scheduled for: 06/25/20 @ 12:30 PM   [name], PharmD Clinical Pharmacist [facility] [phone]  Dyslipidemia Dyslipidemia is an imbalance of waxy, fat-like substances (lipids) in the blood. The body needs lipids in small amounts. Dyslipidemia often involves a high level of cholesterol or triglycerides, which are types of lipids. Common forms of dyslipidemia include:  High levels of LDL cholesterol. LDL is the type of cholesterol that causes fatty deposits (plaques) to build up in the blood vessels that carry blood away from your heart (arteries).  Low levels of HDL cholesterol. HDL cholesterol is the type of cholesterol that protects against heart disease. High levels of HDL remove the LDL buildup from arteries.  High levels of triglycerides. Triglycerides are a fatty substance in the blood that is linked to a buildup of plaques in the arteries. What are the causes? Primary dyslipidemia is caused by changes (mutations) in genes that are passed down through families (inherited). These mutations cause several types of dyslipidemia. Secondary dyslipidemia is caused by lifestyle choices and diseases that lead to dyslipidemia, such as:  Eating a diet that is high in animal fat.  Not getting enough exercise.  Having diabetes, kidney disease, liver disease, or thyroid disease.  Drinking large amounts of alcohol.  Using certain medicines. What increases the risk? You are more likely to develop this condition if you are an older man or if you are a woman who has gone through menopause. Other risk factors include:  Having a family history of dyslipidemia.  Taking certain medicines, including birth control pills, steroids, some diuretics, and beta-blockers.  Smoking cigarettes.  Eating a high-fat diet.  Having certain medical conditions such as diabetes, polycystic ovary syndrome (PCOS), kidney disease, liver disease, or hypothyroidism.  Not exercising  regularly.  Being overweight or obese with too much belly fat. What are the signs or symptoms? In most cases, dyslipidemia does not usually cause any symptoms. In severe cases, very high lipid levels can cause:  Fatty bumps under the skin (xanthomas).  White or gray ring around the black center (pupil) of the eye. Very high triglyceride levels can cause inflammation of the pancreas (pancreatitis). How is this diagnosed? Your health care provider may diagnose dyslipidemia based on a routine blood test (fasting blood test). Because most people do not have symptoms of the condition, this blood testing (lipid profile) is done on adults age 38 and older and is repeated every 5 years. This test checks:  Total cholesterol. This measures the total amount of cholesterol in your blood, including LDL cholesterol, HDL cholesterol, and triglycerides. A healthy number is below 200.  LDL cholesterol. The target number for LDL cholesterol is different for each person, depending on individual risk factors. Ask your health care provider what your LDL cholesterol should be.  HDL cholesterol. An HDL level of 60 or higher is best because it helps to protect against heart disease. A number below 61 for men or below 43 for women increases the risk for heart disease.  Triglycerides. A healthy triglyceride number is below 150. If your lipid profile is abnormal, your health care provider may do other blood tests. How is this treated? Treatment depends on the type of dyslipidemia that you have and your other risk factors for heart disease and stroke. Your health care provider will have a target range for your lipid levels based on this information. For many people, this condition may be treated by lifestyle changes, such as diet and exercise. Your health care provider may recommend that you:  Get regular exercise.  Make changes to your diet.  Quit smoking if you smoke. If diet changes and exercise do not help  you reach your goals, your health care provider may also prescribe medicine to lower lipids. The most commonly prescribed type of medicine lowers your LDL cholesterol (statin drug). If you have a high triglyceride level, your provider may prescribe another type of drug (fibrate) or an omega-3 fish oil supplement, or both. Follow these instructions at home:  Eating and drinking  Follow instructions from your health care provider or dietitian about eating or drinking restrictions.  Eat a healthy diet as told by your health care provider. This can help you reach and maintain a healthy weight, lower your LDL cholesterol, and raise your HDL cholesterol. This may include: ? Limiting your calories, if you are overweight. ? Eating more fruits, vegetables, whole grains, fish, and lean meats. ? Limiting saturated fat, trans fat, and cholesterol.  If you drink alcohol: ? Limit how much you use. ? Be aware of how much alcohol is in your drink. In the [place] one drink equals one 12 oz bottle of beer (355 mL), one 5 oz glass of wine (148 mL), or one 1 oz glass of hard  liquor (44 mL).  Do not drink alcohol if: ? Your health care provider tells you not to drink. ? You are pregnant, may be pregnant, or are planning to become pregnant. Activity  Get regular exercise. Start an exercise and strength training program as told by your health care provider. Ask your health care provider what activities are safe for you. Your health care provider may recommend: ? 30 minutes of aerobic activity 4-6 days a week. Brisk walking is an example of aerobic activity. ? Strength training 2 days a week. General instructions  Do not use any products that contain nicotine or tobacco, such as cigarettes, e-cigarettes, and chewing tobacco. If you need help quitting, ask your health care provider.  Take over-the-counter and prescription medicines only as told by your health care provider. This includes supplements.  Keep all  follow-up visits as told by your health care provider. Contact a health care provider if:  You are: ? Having trouble sticking to your exercise or diet plan. ? Struggling to quit smoking or control your use of alcohol. Summary  Dyslipidemia often involves a high level of cholesterol or triglycerides, which are types of lipids.  Treatment depends on the type of dyslipidemia that you have and your other risk factors for heart disease and stroke.  For many people, treatment starts with lifestyle changes, such as diet and exercise.  Your health care provider may prescribe medicine to lower lipids. This information is not intended to replace advice given to you by your health care provider. Make sure you discuss any questions you have with your health care provider. Document Revised: 04/09/2018 Document Reviewed: 03/16/2018 Elsevier Patient Education  [employer].

## 2019-12-27 ENCOUNTER — Telehealth: Payer: Self-pay | Admitting: *Deleted

## 2019-12-27 MED ORDER — ATORVASTATIN CALCIUM 10 MG PO TABS: 10.0000 mg | ORAL_TABLET | Freq: Every day | ORAL | 3 refills | Status: DC

## 2019-12-27 NOTE — Telephone Encounter (Signed)
-----   Message from Alycia Rossetti, MD sent at 12/27/2019 10:52 AM EDT -----  You can send in the lipitor, or do you need my nurse to send it.    Yes, her labs will be checked in June  ----- Message ----- From: Edythe Clarity, [facility] Sent: 12/27/2019   8:35 AM EDT To: Alycia Rossetti, MD  OK,  may we could send in Rx today then recheck lipids at her appointment in June?  I will call her to discuss taper of PPI.  Gerald Stabs ----- Message ----- From: Alycia Rossetti, MD Sent: 12/27/2019   8:04 AM EDT To: Edythe Clarity, [facility]  She can try tapering off the PPI or reduce dose to 20mg  once a day  Okay to start 10mg  lipitor She has appt in June already  ----- Message ----- From: Edythe Clarity, [facility] Sent: 12/26/2019  12:53 PM EDT To: Alycia Rossetti, MD  Dr. Buelah Manis,  I had a nice visit with Ms. Azizi today.  We discussed a few things that I wanted to run by you to see your thoughts.    1.  We discussed long term PPI use as she reported she was taking it daily.  She was open to trying to taper it off to see how her symptoms are.  Something like every other day for a week, then every three days, then, stop.  2.  Last LDL was 114 on 09/16/19.  10-year ASCVD risk is 19.6%.  We discussed diet/exercise but she was also open to trying a statin.  Recommendation d/t cost would be Atorvastatin 10mg  daily.  Just wanted to see your thoughts?  Thanks Gerald Stabs

## 2019-12-27 NOTE — Progress Notes (Signed)
   I reviewed note and discussed  Medication changes with Pharm D  Vic Blackbird MD

## 2020-01-19 ENCOUNTER — Other Ambulatory Visit: Payer: Self-pay | Admitting: Family Medicine

## 2020-01-28 ENCOUNTER — Other Ambulatory Visit: Payer: Self-pay

## 2020-01-28 ENCOUNTER — Encounter: Payer: Self-pay | Admitting: Family Medicine

## 2020-01-28 ENCOUNTER — Ambulatory Visit (INDEPENDENT_AMBULATORY_CARE_PROVIDER_SITE_OTHER): Payer: Medicare Other | Admitting: Family Medicine

## 2020-01-28 VITALS — BP 138/78 | HR 86 | Temp 97.9°F | Resp 14 | Ht 61.0 in | Wt 209.0 lb

## 2020-01-28 DIAGNOSIS — E785 Hyperlipidemia, unspecified: Secondary | ICD-10-CM | POA: Diagnosis not present

## 2020-01-28 DIAGNOSIS — M1A00X0 Idiopathic chronic gout, unspecified site, without tophus (tophi): Secondary | ICD-10-CM

## 2020-01-28 DIAGNOSIS — I10 Essential (primary) hypertension: Secondary | ICD-10-CM | POA: Diagnosis not present

## 2020-01-28 DIAGNOSIS — N182 Chronic kidney disease, stage 2 (mild): Secondary | ICD-10-CM | POA: Diagnosis not present

## 2020-01-28 DIAGNOSIS — M170 Bilateral primary osteoarthritis of knee: Secondary | ICD-10-CM

## 2020-01-28 DIAGNOSIS — K219 Gastro-esophageal reflux disease without esophagitis: Secondary | ICD-10-CM

## 2020-01-28 DIAGNOSIS — N1831 Chronic kidney disease, stage 3a: Secondary | ICD-10-CM | POA: Insufficient documentation

## 2020-01-28 LAB — COMPLETE METABOLIC PANEL WITH GFR
AG Ratio: 1.2 (calc) (ref 1.0–2.5)
ALT: 14 U/L (ref 6–29)
AST: 30 U/L (ref 10–35)
Albumin: 4.1 g/dL (ref 3.6–5.1)
Alkaline phosphatase (APISO): 70 U/L (ref 37–153)
BUN/Creatinine Ratio: 26 (calc) — ABNORMAL HIGH (ref 6–22)
BUN: 31 mg/dL — ABNORMAL HIGH (ref 7–25)
CO2: 27 mmol/L (ref 20–32)
Calcium: 10.1 mg/dL (ref 8.6–10.4)
Chloride: 106 mmol/L (ref 98–110)
Creat: 1.2 mg/dL — ABNORMAL HIGH (ref 0.60–0.93)
GFR, Est African American: 50 mL/min/{1.73_m2} — ABNORMAL LOW (ref >=60)
GFR, Est Non African American: 44 mL/min/{1.73_m2} — ABNORMAL LOW (ref >=60)
Globulin: 3.4 g/dL (calc) (ref 1.9–3.7)
Glucose, Bld: 85 mg/dL (ref 65–99)
Potassium: 4.4 mmol/L (ref 3.5–5.3)
Sodium: 140 mmol/L (ref 135–146)
Total Bilirubin: 0.6 mg/dL (ref 0.2–1.2)
Total Protein: 7.5 g/dL (ref 6.1–8.1)

## 2020-01-28 LAB — CBC WITH DIFFERENTIAL/PLATELET
Absolute Monocytes: 567 cells/uL (ref 200–950)
Basophils Absolute: 33 cells/uL (ref 0–200)
Basophils Relative: 0.6 %
Eosinophils Absolute: 94 cells/uL (ref 15–500)
Eosinophils Relative: 1.7 %
HCT: 39.7 % (ref 35.0–45.0)
Hemoglobin: 12.9 g/dL (ref 11.7–15.5)
Lymphs Abs: 1997 cells/uL (ref 850–3900)
MCH: 32.7 pg (ref 27.0–33.0)
MCHC: 32.5 g/dL (ref 32.0–36.0)
MCV: 100.5 fL — ABNORMAL HIGH (ref 80.0–100.0)
MPV: 10.7 fL (ref 7.5–12.5)
Monocytes Relative: 10.3 %
Neutro Abs: 2811 cells/uL (ref 1500–7800)
Neutrophils Relative %: 51.1 %
Platelets: 273 10*3/uL (ref 140–400)
RBC: 3.95 10*6/uL (ref 3.80–5.10)
RDW: 11 % (ref 11.0–15.0)
Total Lymphocyte: 36.3 %
WBC: 5.5 10*3/uL (ref 3.8–10.8)

## 2020-01-28 LAB — LIPID PANEL
Cholesterol: 155 mg/dL (ref <200)
HDL: 73 mg/dL (ref >=50)
LDL Cholesterol (Calc): 69 mg/dL (calc)
Non-HDL Cholesterol (Calc): 82 mg/dL (calc) (ref <130)
Total CHOL/HDL Ratio: 2.1 (calc) (ref <5.0)
Triglycerides: 46 mg/dL (ref <150)

## 2020-01-28 LAB — URIC ACID: Uric Acid, Serum: 6 mg/dL (ref 2.5–7.0)

## 2020-01-28 NOTE — Assessment & Plan Note (Signed)
Continue tylenol, voltaren gel Check uric acid level Taking allopurinol again, was only off a few days

## 2020-01-28 NOTE — Assessment & Plan Note (Signed)
She has tapered off PPI Will continue to monitor off meds for now

## 2020-01-28 NOTE — Patient Instructions (Signed)
F/U 4 months  

## 2020-01-28 NOTE — Assessment & Plan Note (Signed)
Controlled no changes 

## 2020-01-28 NOTE — Assessment & Plan Note (Signed)
On statin drug, check LFT, lipids

## 2020-01-28 NOTE — Progress Notes (Signed)
   Subjective:    Patient ID: [name], female    DOB: [birth year], 78 y.o.   MRN: [id number]  Patient presents for Follow-up (is fasting)  Here to follow-up chronic medical problems.  Medications reviewed.  GERD since our last visit she spoke with him pharmacist.  She want to try tapering off of her PPI. She is off the PPI states her stomach is doing better, trying to avoid the trigger foods   Hyperlipidemia she is started on last LDL was 114 January.  The risk factors after speaking with pharmacist she was started on Lipitor 10 mg once a day  HTN- taking norvasc and maxide without any SE, bp at home ranges 120-140/ 50-60's, lowest was 108/55    CKD due for repeat renal function   GOUT- she had run out of her allopurinol and her right knee was been sore, but not swollen,  She has been using voltaren gel and tylenol if needed   She is now walking some    Review Of Systems:  GEN- denies fatigue, fever, weight loss,weakness, recent illness HEENT- denies eye drainage, change in vision, nasal discharge, CVS- denies chest pain, palpitations RESP- denies SOB, cough, wheeze ABD- denies N/V, change in stools, abd pain GU- denies dysuria, hematuria, dribbling, incontinence MSK-+joint pain, muscle aches, injury Neuro- denies headache, dizziness, syncope, seizure activity       Objective:    BP 138/78   Pulse 86   Temp 97.9 F (36.6 C) (Temporal)   Resp 14   Ht 5\' 1"  (1.549 m)   Wt 209 lb (94.8 kg)   SpO2 97%   BMI 39.49 kg/m  GEN- NAD, alert and oriented x3 HEENT- PERRL, EOMI, non injected sclera, pink conjunctiva, MMM, oropharynx clear Neck- Supple, no thyromegaly CVS- RRR, no murmur RESP-CTAB ABD-NABS,soft,NT,ND MSK- Fair ROM bilat knee, no effusion  EXT- No edema Pulses- Radial, DP- 2+        Assessment & Plan:      Problem List Items Addressed This Visit      Unprioritized   CKD (chronic kidney disease), stage II   Relevant Orders   COMPLETE  METABOLIC PANEL WITH GFR   Essential hypertension, benign    Controlled no changes       Relevant Orders   COMPLETE METABOLIC PANEL WITH GFR   GERD (gastroesophageal reflux disease)    She has tapered off PPI Will continue to monitor off meds for now       Gout   Relevant Orders   Uric Acid   Mild hyperlipidemia - Primary    On statin drug, check LFT, lipids       Relevant Orders   CBC with Differential/Platelet   Lipid panel   OA (osteoarthritis) of knee    Continue tylenol, voltaren gel Check uric acid level Taking allopurinol again, was only off a few days          Note: This dictation was prepared with Dragon dictation along with smaller phrase technology. Any transcriptional errors that result from this process are unintentional.

## 2020-01-29 ENCOUNTER — Encounter: Payer: Self-pay | Admitting: *Deleted

## 2020-02-05 ENCOUNTER — Other Ambulatory Visit: Payer: Self-pay | Admitting: Family Medicine

## 2020-02-19 IMAGING — CT CT HEAD W/O CM
3 series · 16 of 47 positions shown, 19 images · non-contrast
Comparison: 05/16/2010 head CT.

CLINICAL DATA: Left lower extremity weakness. Hypertension. No
reported injury.

EXAM:
CT HEAD WITHOUT CONTRAST
TECHNIQUE: Contiguous axial images were obtained from the base of the skull
through the vertex without intravenous contrast.

[Series 2: head w o · axial · 0.46mm/px · z∈[+1299,+1434]mm · 10 of 33 slices shown, 13 images]
[im 3/33  brain]
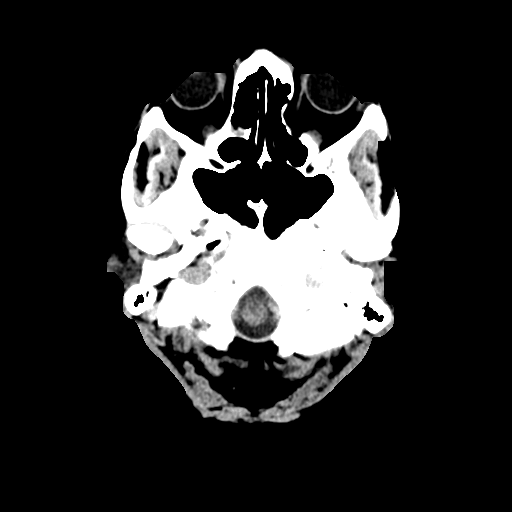
[im 3/33  bone]
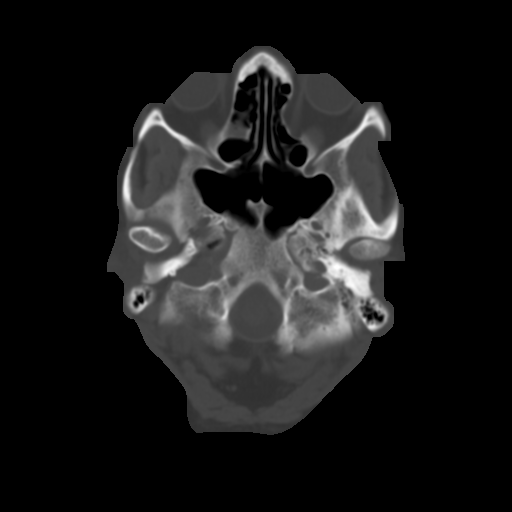
[im 6/33  brain]
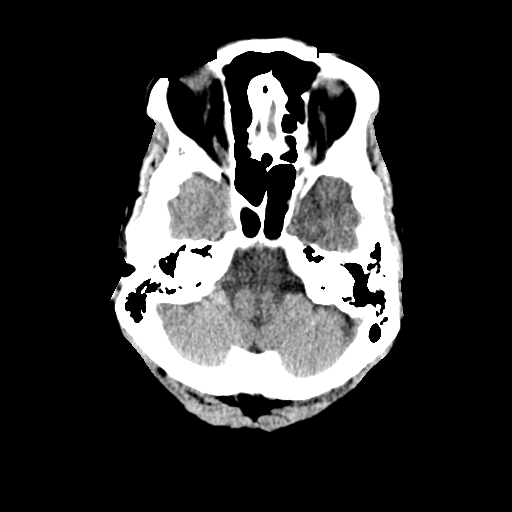
[im 9/33  brain]
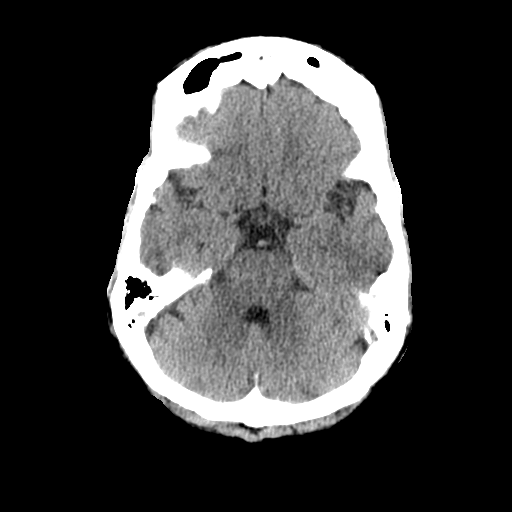
[im 12/33  brain]
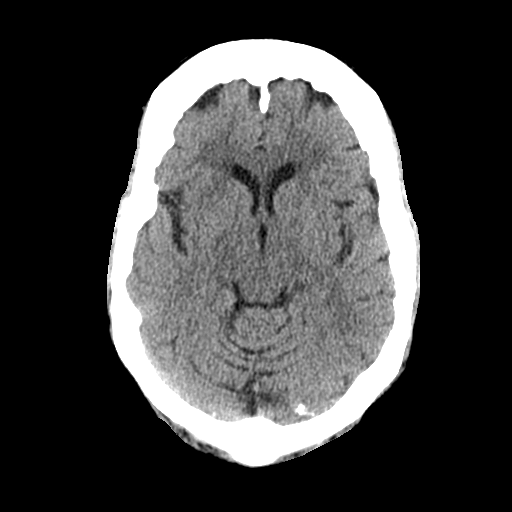
[im 15/33  brain]
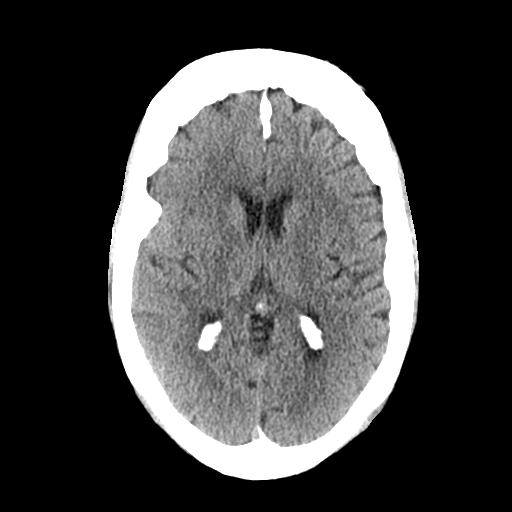
[im 15/33  bone]
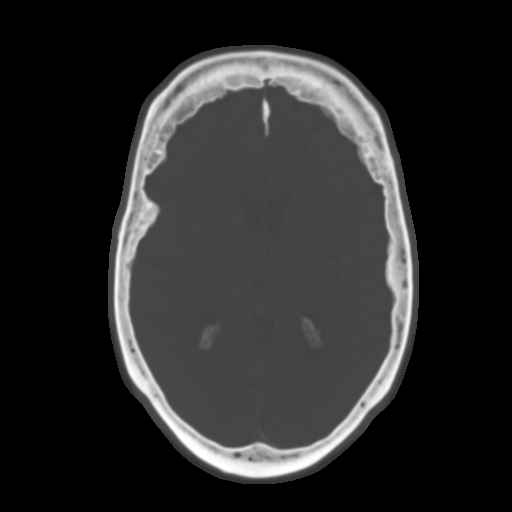
[im 18/33  brain]
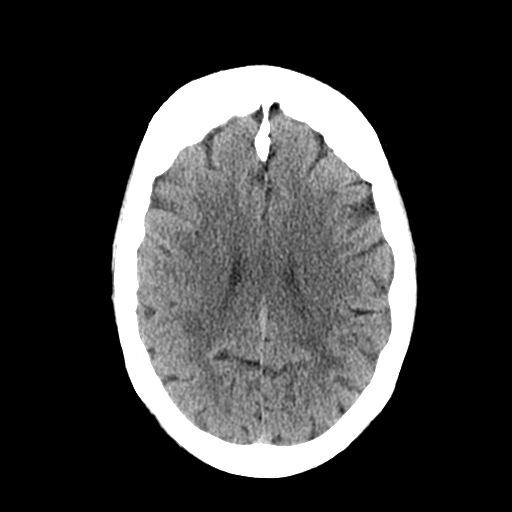
[im 21/33  brain]
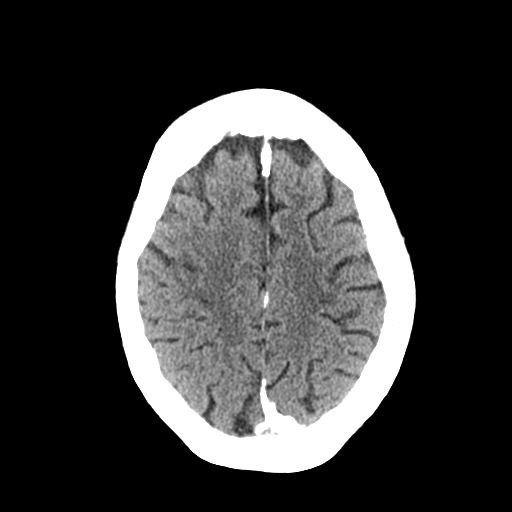
[im 25/33  brain]
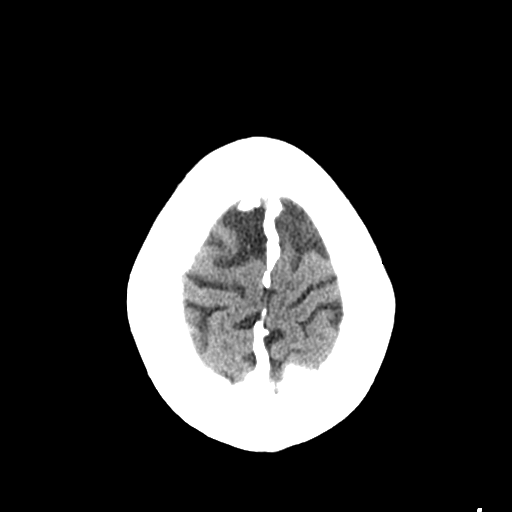
[im 27/33  brain]
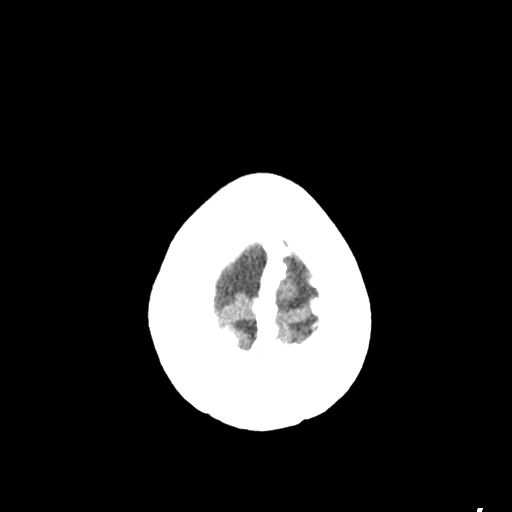
[im 27/33  bone]
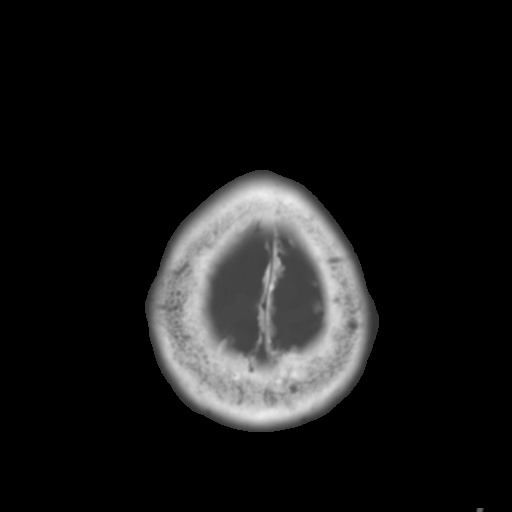
[im 30/33  brain]
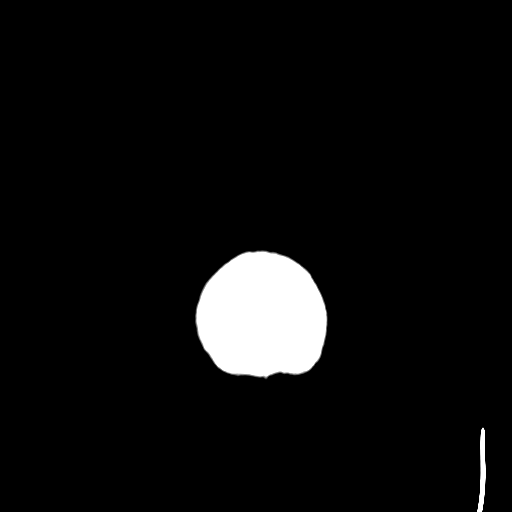

[Series 4: coronal soft · coronal · 0.31mm/px · 3 of 72 slices shown]
[im 24/72  brain]
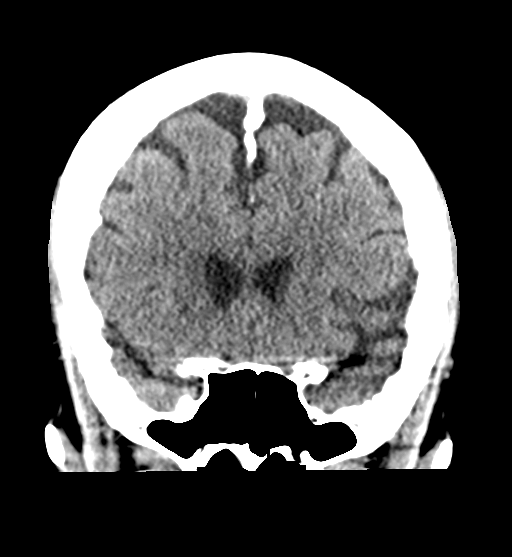
[im 32/72  brain]
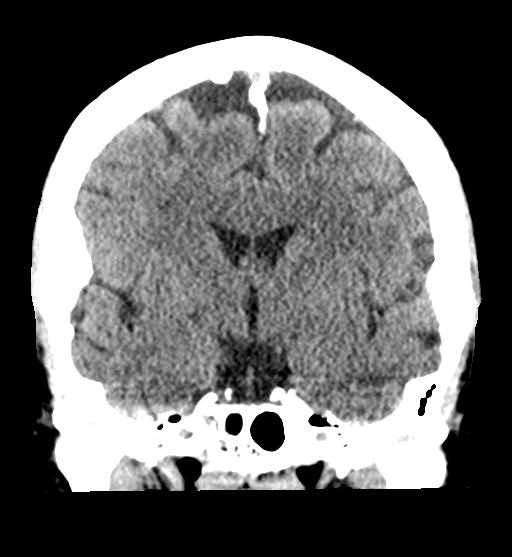
[im 40/72  brain]
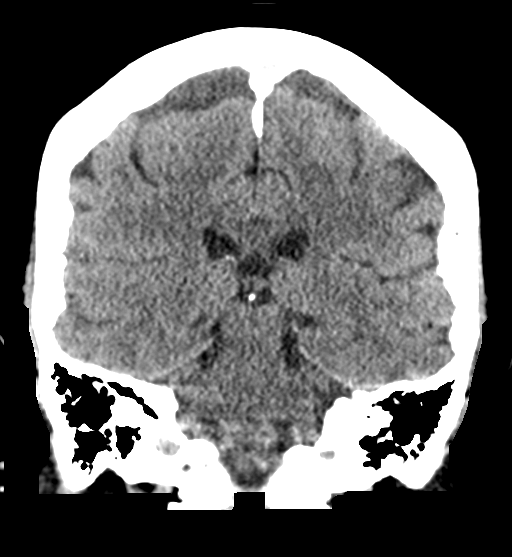

[Series 5: sagittal soft · sagittal · 0.34mm/px · 3 of 54 slices shown]
[im 18/54  brain]
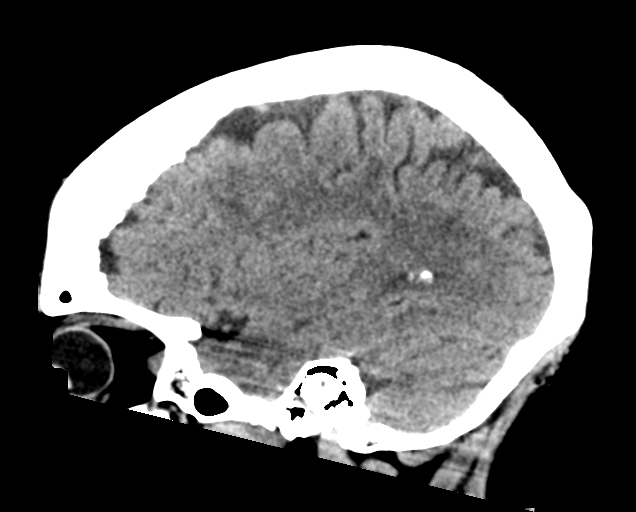
[im 27/54  brain]
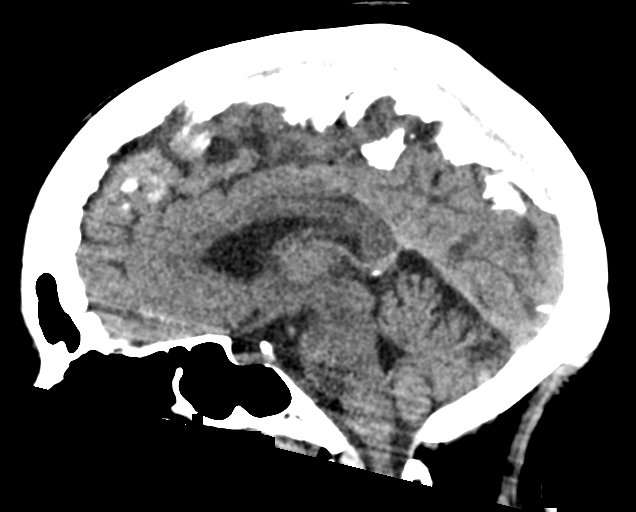
[im 36/54  brain]
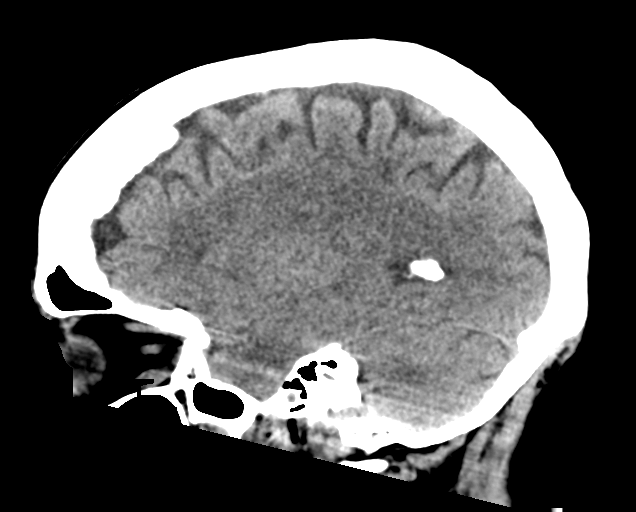

[16 of 47 positions shown; findings below may reference images not displayed]

FINDINGS: Brain: No evidence of parenchymal hemorrhage or extra-axial fluid
collection. No mass lesion, mass effect, or midline shift. No CT
evidence of acute infarction. Nonspecific mild subcortical and
periventricular white matter hypodensity, most in keeping with
chronic small vessel ischemic change. Cerebral volume is age
appropriate. No ventriculomegaly.

Vascular: No acute abnormality.

Skull: No evidence of calvarial fracture. Osteitis frontalis
interna.

Sinuses/Orbits: The visualized paranasal sinuses are essentially
clear.

Other:  The mastoid air cells are unopacified.
IMPRESSION: 1.  No evidence of acute intracranial abnormality.
2. Mild chronic small vessel ischemic changes in the cerebral white
matter.

## 2020-03-07 ENCOUNTER — Other Ambulatory Visit: Payer: Self-pay | Admitting: Family Medicine

## 2020-03-19 ENCOUNTER — Telehealth: Payer: Self-pay | Admitting: Family Medicine

## 2020-03-19 DIAGNOSIS — J209 Acute bronchitis, unspecified: Secondary | ICD-10-CM

## 2020-03-19 MED ORDER — BENZONATATE 100 MG PO CAPS: 100.0000 mg | ORAL_CAPSULE | Freq: Three times a day (TID) | ORAL | 0 refills | Status: DC | PRN

## 2020-03-19 NOTE — Telephone Encounter (Signed)
Patient requesting a refill on her benzonatate 100 mg she would like these sent to [facility] in [place].  CB# [phone]

## 2020-03-19 NOTE — Telephone Encounter (Signed)
Prescription sent to pharmacy.

## 2020-04-20 ENCOUNTER — Other Ambulatory Visit: Payer: Self-pay | Admitting: Family Medicine

## 2020-04-23 ENCOUNTER — Other Ambulatory Visit: Payer: Self-pay | Admitting: Family Medicine

## 2020-06-08 ENCOUNTER — Other Ambulatory Visit: Payer: Self-pay

## 2020-06-08 ENCOUNTER — Encounter: Payer: Self-pay | Admitting: Family Medicine

## 2020-06-08 ENCOUNTER — Ambulatory Visit (INDEPENDENT_AMBULATORY_CARE_PROVIDER_SITE_OTHER): Payer: Medicare Other | Admitting: Family Medicine

## 2020-06-08 VITALS — BP 138/74 | HR 80 | Temp 97.8°F | Resp 14 | Ht 61.0 in | Wt 210.0 lb

## 2020-06-08 DIAGNOSIS — Z23 Encounter for immunization: Secondary | ICD-10-CM | POA: Diagnosis not present

## 2020-06-08 DIAGNOSIS — E785 Hyperlipidemia, unspecified: Secondary | ICD-10-CM

## 2020-06-08 DIAGNOSIS — M1A00X0 Idiopathic chronic gout, unspecified site, without tophus (tophi): Secondary | ICD-10-CM

## 2020-06-08 DIAGNOSIS — N182 Chronic kidney disease, stage 2 (mild): Secondary | ICD-10-CM

## 2020-06-08 DIAGNOSIS — I10 Essential (primary) hypertension: Secondary | ICD-10-CM | POA: Diagnosis not present

## 2020-06-08 DIAGNOSIS — E559 Vitamin D deficiency, unspecified: Secondary | ICD-10-CM | POA: Diagnosis not present

## 2020-06-08 DIAGNOSIS — E669 Obesity, unspecified: Secondary | ICD-10-CM

## 2020-06-08 LAB — COMPREHENSIVE METABOLIC PANEL
AG Ratio: 1.1 (calc) (ref 1.0–2.5)
ALT: 12 U/L (ref 6–29)
AST: 35 U/L (ref 10–35)
Albumin: 4.1 g/dL (ref 3.6–5.1)
Alkaline phosphatase (APISO): 65 U/L (ref 37–153)
BUN/Creatinine Ratio: 20 (calc) (ref 6–22)
BUN: 25 mg/dL (ref 7–25)
CO2: 22 mmol/L (ref 20–32)
Calcium: 10.2 mg/dL (ref 8.6–10.4)
Chloride: 106 mmol/L (ref 98–110)
Creat: 1.25 mg/dL — ABNORMAL HIGH (ref 0.60–0.93)
Globulin: 3.6 g/dL (calc) (ref 1.9–3.7)
Glucose, Bld: 88 mg/dL (ref 65–99)
Potassium: 4.3 mmol/L (ref 3.5–5.3)
Sodium: 139 mmol/L (ref 135–146)
Total Bilirubin: 0.7 mg/dL (ref 0.2–1.2)
Total Protein: 7.7 g/dL (ref 6.1–8.1)

## 2020-06-08 LAB — CBC WITH DIFFERENTIAL/PLATELET
Absolute Monocytes: 370 cells/uL (ref 200–950)
Basophils Absolute: 22 cells/uL (ref 0–200)
Basophils Relative: 0.5 %
Eosinophils Absolute: 52 cells/uL (ref 15–500)
Eosinophils Relative: 1.2 %
HCT: 39.9 % (ref 35.0–45.0)
Hemoglobin: 13.3 g/dL (ref 11.7–15.5)
Lymphs Abs: 1733 cells/uL (ref 850–3900)
MCH: 33.8 pg — ABNORMAL HIGH (ref 27.0–33.0)
MCHC: 33.3 g/dL (ref 32.0–36.0)
MCV: 101.3 fL — ABNORMAL HIGH (ref 80.0–100.0)
MPV: 11.2 fL (ref 7.5–12.5)
Monocytes Relative: 8.6 %
Neutro Abs: 2124 cells/uL (ref 1500–7800)
Neutrophils Relative %: 49.4 %
Platelets: 241 10*3/uL (ref 140–400)
RBC: 3.94 10*6/uL (ref 3.80–5.10)
RDW: 10.8 % — ABNORMAL LOW (ref 11.0–15.0)
Total Lymphocyte: 40.3 %
WBC: 4.3 10*3/uL (ref 3.8–10.8)

## 2020-06-08 LAB — LIPID PANEL
Cholesterol: 154 mg/dL (ref <200)
HDL: 74 mg/dL (ref >=50)
LDL Cholesterol (Calc): 67 mg/dL (calc)
Non-HDL Cholesterol (Calc): 80 mg/dL (calc) (ref <130)
Total CHOL/HDL Ratio: 2.1 (calc) (ref <5.0)
Triglycerides: 45 mg/dL (ref <150)

## 2020-06-08 LAB — VITAMIN D 25 HYDROXY (VIT D DEFICIENCY, FRACTURES): Vit D, 25-Hydroxy: 40 ng/mL (ref 30–100)

## 2020-06-08 NOTE — Progress Notes (Signed)
   Subjective:    Patient ID: [name], female    DOB: [birth year], 79 y.o.   MRN: [id number]  Patient presents for Follow-up (is fasting)  Pt here to f/u chronic medical problems   Hyperlipidemia- taking lipitor at bedtime, due for recheck on lipids   HTN- she has been checking BP at home , she has been taking norvasc 5mg , maxzide   readings average 130-140/ 60-80  , some below 384 systolic but no dizziness, no chest pain, no SOB   Constipation - controlled using as needed miralax    Her brother has some type of metastatic growths on his body so she has been stressing over him some    GOUT - taking allopurinol , no recent flares   Due for flu shot   Review Of Systems:  GEN- denies fatigue, fever, weight loss,weakness, recent illness HEENT- denies eye drainage, change in vision, nasal discharge, CVS- denies chest pain, palpitations RESP- denies SOB, cough, wheeze ABD- denies N/V, change in stools, abd pain GU- denies dysuria, hematuria, dribbling, incontinence MSK- denies joint pain, muscle aches, injury Neuro- denies headache, dizziness, syncope, seizure activity       Objective:    BP 138/74   Pulse 80   Temp 97.8 F (36.6 C) (Temporal)   Resp 14   Ht 5\' 1"  (1.549 m)   Wt 210 lb (95.3 kg)   SpO2 97%   BMI 39.68 kg/m  HEENT- PERRL, EOMI, non injected sclera, pink conjunctiva, Neck- Supple, no thyromegaly CVS- RRR, no murmur RESP-CTAB ABD-NABS,soft,NT,ND EXT- No edema Pulses- Radial, DP- 2+      Assessment & Plan:      Problem List Items Addressed This Visit      Unprioritized   CKD (chronic kidney disease), stage II   Relevant Orders   CBC with Differential/Platelet   Comprehensive metabolic panel   Class 2 obesity    Discussed some exercises she can do within the home, seated position History of vitamin D def, recheck level She does walk on occasion, but previously more active before she retired last year      Essential hypertension,  benign - Primary    Blood pressure controlled, no changes to meds Check renal function      Relevant Orders   CBC with Differential/Platelet   Gout    Continue allopurinol, no recent flares       Mild hyperlipidemia    Tolerating statin drug Check lipids , LFT today       Relevant Orders   Lipid panel   Vitamin D deficiency   Relevant Orders   Vitamin D, 25-hydroxy      Note: This dictation was prepared with Dragon dictation along with smaller phrase technology. Any transcriptional errors that result from this process are unintentional.

## 2020-06-08 NOTE — Assessment & Plan Note (Signed)
Discussed some exercises she can do within the home, seated position History of vitamin D def, recheck level She does walk on occasion, but previously more active before she retired last year

## 2020-06-08 NOTE — Assessment & Plan Note (Signed)
Blood pressure controlled, no changes to meds Check renal function

## 2020-06-08 NOTE — Assessment & Plan Note (Signed)
Continue allopurinol, no recent flares

## 2020-06-08 NOTE — Assessment & Plan Note (Signed)
Tolerating statin drug Check lipids , LFT today

## 2020-06-08 NOTE — Patient Instructions (Addendum)
F/U 4 months for Physical  Flu shot given

## 2020-06-09 ENCOUNTER — Encounter: Payer: Self-pay | Admitting: *Deleted

## 2020-06-25 ENCOUNTER — Other Ambulatory Visit: Payer: Self-pay

## 2020-06-25 ENCOUNTER — Ambulatory Visit: Payer: Medicare Other

## 2020-06-25 DIAGNOSIS — I10 Essential (primary) hypertension: Secondary | ICD-10-CM

## 2020-06-25 DIAGNOSIS — E785 Hyperlipidemia, unspecified: Secondary | ICD-10-CM

## 2020-06-25 DIAGNOSIS — K219 Gastro-esophageal reflux disease without esophagitis: Secondary | ICD-10-CM

## 2020-06-25 MED ORDER — ALBUTEROL SULFATE HFA 108 (90 BASE) MCG/ACT IN AERS: INHALATION_SPRAY | RESPIRATORY_TRACT | 1 refills | Status: DC

## 2020-06-25 NOTE — Patient Instructions (Addendum)
Visit Information  Goals Addressed            This Visit's Progress   . GERD       CARE PLAN ENTRY  Current Barriers:  . Chronic Disease Management support, education, and care coordination needs related to  GERD.  Pharmacist Clinical Goal(s):  [name] Over the next 180 days, we will work to eliminate symptoms of GERD. Interventions: . Comprehensive medication review performed. . Evaluated GERD symptoms after PPI taper.  Patient Self Care Activities:  . Over the next 180 days the patient will work to avoid trigger foods causing GERD symptoms. . Treat intermittent symptoms with Pepcid AC. [name] Report any increase or change in symptoms to PharmD or PCP.  Please see past updates related to this goal by clicking on the "Past Updates" button in the selected goal      . Hyperlipidemia - LDL <100       CARE PLAN ENTRY (see longitudinal plan of care for additional care plan information)  Current Barriers:  . Controlled hyperlipidemia, complicated by hypertension and GERD. . Current antihyperlipidemic regimen: none . Previous antihyperlipidemic medications tried: none . Most recent lipid panel: Lipid Panel  .   [name]  Component . Value . Date/Time .   [name] CHOL . 154 . 06/08/2020 5366 .   [name] TRIG . 45 . 06/08/2020 4403 .   [name] HDL . 74 . 06/08/2020 4742 .   [name] CHOLHDL . 2.1 . 06/08/2020 5956 .   [name] VLDL . 12 . 01/31/2017 0929 .   [name] [place] . 67 . 06/08/2020 3875 .   .  . ASCVD risk enhancing conditions: age >89, DM, HTN, CKD, CHF, current smoker  Pharmacist Clinical Goal(s):  [name] Over the next 180 days, patient will work with PharmD and providers towards optimized antihyperlipidemic therapy.  Interventions: . Comprehensive medication review performed; medication list updated in electronic medical record.  Bertram Savin care team collaboration (see longitudinal plan of care) . Discussed benefits of regular exercise and diet on cholesterol.     Patient Self Care Activities:  . Patient  will focus on medication adherence by using pill box. . Patient will pick up prescription from pharmacy this weekend. . Over the next 180 days patient will take medication once nightly as prescribed, will report any side effects to PharmD or PCP.    Click to see past updates    . Hypertension - BP < 140/90       CARE PLAN ENTRY (see longitudinal plan of care for additional care plan information)  Current Barriers:  . Uncontrolled hypertension, complicated by hyperlipidemia, GERD. . Current antihypertensive regimen: amlodipine 5mg  daily, triamterene/hctz 37.5-25 mg daily . Previous antihypertensives tried: metoprolol ER 25mg  daily . Last practice recorded BP readings:  BP Readings from Last 3 Encounters:  06/08/20 138/74  01/28/20 138/78  09/30/19 132/76   . Current home BP reading average is around 130/60.  Pharmacist Clinical Goal(s):  [name] Over the next 180 days, patient will work with PharmD and providers to optimize antihypertensive regimen. o During this time patient will continue to monitor BP at home twice daily and record in log to discuss at future appointments.  Instructed patient to follow up with providers if BP is consistently > 140/90.  Interventions: . Inter-disciplinary care team collaboration (see longitudinal plan of care) . Comprehensive medication review performed; medication list updated in the electronic medical record.  . Recommended patient try to implement 30 minutes of aerobic exercise daily as  tolerated.  Start with just a few days per week and work up to daily if tolerated.  Patient Self Care Activities:  . Patient will continue to check BP twice daily at home , document, and provide at future appointments . Patient will focus on medication adherence by pill box. . Over the next 180 days patient will report any consistent BP readings >140/90s to PharmD or PCP.  Please see past updates related to this goal by clicking on the "Past Updates" button in the  selected goal         The patient verbalized understanding of instructions provided today and agreed to receive a mailed copy of patient instruction and/or educational materials.  Telephone follow up appointment with pharmacy team member scheduled for: 6 months  [name], PharmD Clinical Pharmacist [facility] [phone]   Food Choices for Gastroesophageal Reflux Disease, Adult When you have gastroesophageal reflux disease (GERD), the foods you eat and your eating habits are very important. Choosing the right foods can help ease the discomfort of GERD. Consider working with a diet and nutrition specialist (dietitian) to help you make healthy food choices. What general guidelines should I follow?  Eating plan  Choose healthy foods low in fat, such as fruits, vegetables, whole grains, low-fat dairy products, and lean meat, fish, and poultry.  Eat frequent, small meals instead of three large meals each day. Eat your meals slowly, in a relaxed setting. Avoid bending over or lying down until 2-3 hours after eating.  Limit high-fat foods such as fatty meats or fried foods.  Limit your intake of oils, butter, and shortening to less than 8 teaspoons each day.  Avoid the following: ? Foods that cause symptoms. These may be different for different people. Keep a food diary to keep track of foods that cause symptoms. ? Alcohol. ? Drinking large amounts of liquid with meals. ? Eating meals during the 2-3 hours before bed.  Cook foods using methods other than frying. This may include baking, grilling, or broiling. Lifestyle  Maintain a healthy weight. Ask your health care provider what weight is healthy for you. If you need to lose weight, work with your health care provider to do so safely.  Exercise for at least 30 minutes on 5 or more days each week, or as told by your health care provider.  Avoid wearing clothes that fit tightly around your waist and  chest.  Do not use any products that contain nicotine or tobacco, such as cigarettes and e-cigarettes. If you need help quitting, ask your health care provider.  Sleep with the head of your bed raised. Use a wedge under the mattress or blocks under the bed frame to raise the head of the bed. What foods are not recommended? The items listed may not be a complete list. Talk with your dietitian about what dietary choices are best for you. Grains Pastries or quick breads with added fat. [place] toast. Vegetables Deep fried vegetables. [place] fries. Any vegetables prepared with added fat. Any vegetables that cause symptoms. For some people this may include tomatoes and tomato products, chili peppers, onions and garlic, and horseradish. Fruits Any fruits prepared with added fat. Any fruits that cause symptoms. For some people this may include citrus fruits, such as oranges, grapefruit, pineapple, and lemons. Meats and other protein foods High-fat meats, such as fatty beef or pork, hot dogs, ribs, ham, sausage, salami and bacon. Fried meat or protein, including fried fish and fried chicken. Nuts  and nut butters. Dairy Whole milk and chocolate milk. Sour cream. Cream. Ice cream. Cream cheese. Milk shakes. Beverages Coffee and tea, with or without caffeine. Carbonated beverages. Sodas. Energy drinks. Fruit juice made with acidic fruits (such as orange or grapefruit). Tomato juice. Alcoholic drinks. Fats and oils Butter. Margarine. Shortening. Ghee. Sweets and desserts Chocolate and cocoa. Donuts. Seasoning and other foods Pepper. Peppermint and spearmint. Any condiments, herbs, or seasonings that cause symptoms. For some people, this may include curry, hot sauce, or vinegar-based salad dressings. Summary  When you have gastroesophageal reflux disease (GERD), food and lifestyle choices are very important to help ease the discomfort of GERD.  Eat frequent, small meals instead of three large meals  each day. Eat your meals slowly, in a relaxed setting. Avoid bending over or lying down until 2-3 hours after eating.  Limit high-fat foods such as fatty meat or fried foods. This information is not intended to replace advice given to you by your health care provider. Make sure you discuss any questions you have with your health care provider. Document Revised: 12/06/2018 Document Reviewed: 08/16/2016 Elsevier Patient Education  [place].

## 2020-06-25 NOTE — Progress Notes (Signed)
Chronic Care Management   Follow Up Note   06/25/2020 Name: [name] MRN: [id number] DOB: 23-Dec-1940  Referred by: Alycia Rossetti, MD Reason for referral : Chronic Care Management (PharmD follow up visit)   [name] is a 79 y.o. year old female who is a primary care patient of [place], Modena Nunnery, MD. The CCM team was consulted for assistance with chronic disease management and care coordination needs.    Review of patient status, including review of consultants reports, relevant laboratory and other test results, and collaboration with appropriate care team members and the patient's provider was performed as part of comprehensive patient evaluation and provision of chronic care management services.    SDOH (Social Determinants of Health) assessments performed: No See Care Plan activities for detailed interventions related to SDOH)    Interventions during our last visit included:  Addition of atorvastatin 86m due to elevated LDL  Taper off of PPI for GERD and working on elimination of trigger foods  Outpatient Encounter Medications as of 06/25/2020  Medication Sig  . albuterol (VENTOLIN HFA) 108 (90 Base) MCG/ACT inhaler INHALE TWO PUFFS INTO LUNGS EVERY SIX HOURS AS NEEDED FOR WHEEZING OR SHORTNESS OF BREATH.  .Marland Kitchenallopurinol (ZYLOPRIM) 100 MG tablet Take 2 tablets by mouth once daily  . amLODipine (NORVASC) 5 MG tablet Take 1 tablet by mouth once daily  . aspirin EC 81 MG tablet Take 81 mg by mouth daily.    .Marland Kitchenatorvastatin (LIPITOR) 10 MG tablet Take 1 tablet (10 mg total) by mouth daily.  . benzonatate (TESSALON) 100 MG capsule Take 1 capsule (100 mg total) by mouth 3 (three) times daily as needed for cough.  . diclofenac sodium (VOLTAREN) 1 % GEL Apply 2 g topically 4 (four) times daily. Pain  . fluticasone (FLONASE) 50 MCG/ACT nasal spray Place 2 sprays into both nostrils daily.  .Marland Kitchenloratadine (CLARITIN) 10 MG tablet Take 1 tablet (10 mg total) by mouth  daily as needed for allergies.  .Marland Kitchenondansetron (ZOFRAN ODT) 4 MG disintegrating tablet Take 1 tablet (4 mg total) by mouth every 6 (six) hours as needed for nausea or vomiting.  . polyethylene glycol powder (GLYCOLAX/MIRALAX) 17 GM/SCOOP powder Take 17 g by mouth daily as needed.  . triamterene-hydrochlorothiazide (MAXZIDE-25) 37.5-25 MG tablet Take 1 tablet by mouth once daily  . [DISCONTINUED] albuterol (VENTOLIN HFA) 108 (90 Base) MCG/ACT inhaler INHALE TWO PUFFS INTO LUNGS EVERY SIX HOURS AS NEEDED FOR WHEEZING OR SHORTNESS OF BREATH.   No facility-administered encounter medications on file as of 06/25/2020.     Goals Addressed            This Visit's Progress   . GERD       CARE PLAN ENTRY  Current Barriers:  . Chronic Disease Management support, education, and care coordination needs related to  GERD.  Pharmacist Clinical Goal(s):  .Marland KitchenOver the next 180 days, we will work to eliminate symptoms of GERD. Interventions: . Comprehensive medication review performed. . Evaluated GERD symptoms after PPI taper.  Patient Self Care Activities:  . Over the next 180 days the patient will work to avoid trigger foods causing GERD symptoms. . Treat intermittent symptoms with Pepcid AC. .Marland KitchenReport any increase or change in symptoms to PharmD or PCP.  Please see past updates related to this goal by clicking on the "Past Updates" button in the selected goal      . Hyperlipidemia - LDL <100  CARE PLAN ENTRY (see longitudinal plan of care for additional care plan information)  Current Barriers:  . Controlled hyperlipidemia, complicated by hypertension and GERD. . Current antihyperlipidemic regimen: none . Previous antihyperlipidemic medications tried: none . Most recent lipid panel: Lipid Panel  .   Marland Kitchen  Component . Value . Date/Time .   Marland Kitchen CHOL . 154 . 06/08/2020 1610 .   Marland Kitchen TRIG . 45 . 06/08/2020 9604 .   Marland Kitchen HDL . 74 . 06/08/2020 5409 .   Marland Kitchen CHOLHDL . 2.1 . 06/08/2020 8119 .   Marland Kitchen VLDL  . 12 . 01/31/2017 0929 .   Marland Kitchen [place] . 67 . 06/08/2020 1478 .   .  . ASCVD risk enhancing conditions: age >30, DM, HTN, CKD, CHF, current smoker  Pharmacist Clinical Goal(s):  Marland Kitchen Over the next 180 days, patient will work with PharmD and providers towards optimized antihyperlipidemic therapy.  Interventions: . Comprehensive medication review performed; medication list updated in electronic medical record.  Bertram Savin care team collaboration (see longitudinal plan of care) . Discussed benefits of regular exercise and diet on cholesterol.     Patient Self Care Activities:  . Patient will focus on medication adherence by using pill box. . Patient will pick up prescription from pharmacy this weekend. . Over the next 180 days patient will take medication once nightly as prescribed, will report any side effects to PharmD or PCP.    Click to see past updates    . Hypertension - BP < 140/90       CARE PLAN ENTRY (see longitudinal plan of care for additional care plan information)  Current Barriers:  . Uncontrolled hypertension, complicated by hyperlipidemia, GERD. . Current antihypertensive regimen: amlodipine 9m daily, triamterene/hctz 37.5-25 mg daily . Previous antihypertensives tried: metoprolol ER 262mdaily . Last practice recorded BP readings:  BP Readings from Last 3 Encounters:  06/08/20 138/74  01/28/20 138/78  09/30/19 132/76   . Current home BP reading average is around 130/60.  Pharmacist Clinical Goal(s):  . Marland Kitchenver the next 180 days, patient will work with PharmD and providers to optimize antihypertensive regimen. o During this time patient will continue to monitor BP at home twice daily and record in log to discuss at future appointments.  Instructed patient to follow up with providers if BP is consistently > 140/90.  Interventions: . Inter-disciplinary care team collaboration (see longitudinal plan of care) . Comprehensive medication review performed;  medication list updated in the electronic medical record.  . Recommended patient try to implement 30 minutes of aerobic exercise daily as tolerated.  Start with just a few days per week and work up to daily if tolerated.  Patient Self Care Activities:  . Patient will continue to check BP twice daily at home , document, and provide at future appointments . Patient will focus on medication adherence by pill box. . Over the next 180 days patient will report any consistent BP readings >140/90s to PharmD or PCP.  Please see past updates related to this goal by clicking on the "Past Updates" button in the selected goal        Hypertension   BP goal is:  <140/90  Office blood pressures are  BP Readings from Last 3 Encounters:  06/08/20 138/74  01/28/20 138/78  09/30/19 132/76   Patient checks BP at home 3-5x per week Patient home BP readings are ranging: 130/60s  Patient has failed these meds in the past: none noted Patient is currently controlled on the  following medications:  . Amlodipine 59m daily . Triamterene/HCTZ 37.5-25mg daily  We discussed   She continues to be adherent with medication based on patient report and fill history.  Denies dizziness, headaches.    Reports most WNL and all BP are < 140/90.  Denies swelling.  Plan  Continue current medications     Hyperlipidemia   LDL goal < 100  Last lipids Lab Results  Component Value Date   CHOL 154 06/08/2020   HDL 74 06/08/2020   LDLCALC 67 06/08/2020   TRIG 45 06/08/2020   CHOLHDL 2.1 06/08/2020   Hepatic Function Latest Ref Rng & Units 06/08/2020 01/28/2020 09/16/2019  Total Protein 6.1 - 8.1 g/dL 7.7 7.5 7.5  Albumin 3.5 - 5.0 g/dL - - -  AST 10 - 35 U/L 35 30 31  ALT 6 - 29 U/L '12 14 12  ' Alk Phosphatase 38 - 126 U/L - - -  Total Bilirubin 0.2 - 1.2 mg/dL 0.7 0.6 0.7     The 10-year ASCVD risk score (Mikey BussingDC Jr., et al., 2013) is: 16.9%   Values used to calculate the score:     Age: 89years      Sex: Female     Is Non-Hispanic African American: Yes     Diabetic: No     Tobacco smoker: No     Systolic Blood Pressure: 1655mmHg     Is BP treated: Yes     HDL Cholesterol: 74 mg/dL     Total Cholesterol: 154 mg/dL   Patient has failed these meds in past: none noted Patient is currently controlled on the following medications:  . Atorvastatin 133mdaily  We discussed:    Cholesterol has improved tremendously since adding atorvastatin.  She is tolerating medication well, denies myalgias.  Counseled on importance of statin medications.  Plan  Continue current medications GERD   Patient has failed these meds in past: none noted Patient is currently controlled on the following medications:  . NO medication at this time  We discussed:    She has successfully tapered off PPI and no issues with acid reflux post taper.  She continues to work on trRetail bankeroods.  Mentions intermittent symptoms, to which I counseled her on using H2 blocker prn/.  Plan  Continue control with trigger foods, treat intermittent symptoms with Pepcid AC.   ChBeverly MilchPharmD Clinical Pharmacist [phone]

## 2020-06-29 ENCOUNTER — Other Ambulatory Visit: Payer: Self-pay | Admitting: *Deleted

## 2020-06-29 MED ORDER — ALBUTEROL SULFATE HFA 108 (90 BASE) MCG/ACT IN AERS: INHALATION_SPRAY | RESPIRATORY_TRACT | 1 refills | Status: DC

## 2020-07-28 ENCOUNTER — Other Ambulatory Visit: Payer: Self-pay | Admitting: Family Medicine

## 2020-07-29 ENCOUNTER — Other Ambulatory Visit: Payer: Self-pay | Admitting: Family Medicine

## 2020-07-29 DIAGNOSIS — J209 Acute bronchitis, unspecified: Secondary | ICD-10-CM

## 2020-08-03 DIAGNOSIS — Z20822 Contact with and (suspected) exposure to covid-19: Secondary | ICD-10-CM | POA: Diagnosis not present

## 2020-08-03 DIAGNOSIS — R059 Cough, unspecified: Secondary | ICD-10-CM | POA: Diagnosis not present

## 2020-08-31 ENCOUNTER — Telehealth: Payer: Self-pay | Admitting: Pharmacist

## 2020-08-31 NOTE — Chronic Care Management (AMB) (Signed)
Chronic Care Management Pharmacy Assistant   Name: SAMARA STANKOWSKI  MRN: [id number] DOB: [birth year]  Reason for Encounter: Disease State for HTN.  Patient Questions:  1.  Have you seen any other providers since your last visit? Yes.   2.  Any changes in your medicines or health? Yes.    PCP : Salley Scarlet, MD   Their chronic conditions include: hypertension, GERD, osteoarthritis, hyperlipidemia, and gout.  Office Visits: None since 06/25/20   Consults: None since 06/25/20  Allergies:  No Known Allergies  Medications: Outpatient Encounter Medications as of 08/31/2020  Medication Sig   albuterol (VENTOLIN HFA) 108 (90 Base) MCG/ACT inhaler INHALE TWO PUFFS INTO LUNGS EVERY SIX HOURS AS NEEDED FOR WHEEZING OR SHORTNESS OF BREATH.   allopurinol (ZYLOPRIM) 100 MG tablet Take 2 tablets by mouth once daily   amLODipine (NORVASC) 5 MG tablet Take 1 tablet by mouth once daily   aspirin EC 81 MG tablet Take 81 mg by mouth daily.     atorvastatin (LIPITOR) 10 MG tablet Take 1 tablet (10 mg total) by mouth daily.   benzonatate (TESSALON) 100 MG capsule Take 1 capsule by mouth three times daily as needed for cough   diclofenac sodium (VOLTAREN) 1 % GEL Apply 2 g topically 4 (four) times daily. Pain   fluticasone (FLONASE) 50 MCG/ACT nasal spray Place 2 sprays into both nostrils daily.   loratadine (CLARITIN) 10 MG tablet Take 1 tablet (10 mg total) by mouth daily as needed for allergies.   ondansetron (ZOFRAN ODT) 4 MG disintegrating tablet Take 1 tablet (4 mg total) by mouth every 6 (six) hours as needed for nausea or vomiting.   polyethylene glycol powder (GLYCOLAX/MIRALAX) 17 GM/SCOOP powder Take 17 g by mouth daily as needed.   triamterene-hydrochlorothiazide (MAXZIDE-25) 37.5-25 MG tablet Take 1 tablet by mouth once daily   No facility-administered encounter medications on file as of 08/31/2020.    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 Spoke with patient regarding BP  Recent Office Vitals: BP Readings from Last 3 Encounters:  06/08/20 138/74  01/28/20 138/78  09/30/19 132/76   Pulse Readings from Last 3 Encounters:  06/08/20 80  01/28/20 86  09/30/19 62    Wt Readings from Last 3 Encounters:  06/08/20 210 lb (95.3 kg)  01/28/20 209 lb (94.8 kg)  09/30/19 200 lb (90.7 kg)     Kidney Function Lab Results  Component Value Date/Time   CREATININE 1.25 (H) 06/08/2020 08:32 AM   CREATININE 1.20 (H) 01/28/2020 08:24 AM   GFRNONAA 44 (L) 01/28/2020 08:24 AM   GFRAA 50 (L) 01/28/2020 08:24 AM    BMP Latest Ref Rng & Units 06/08/2020 01/28/2020 09/16/2019  Glucose 65 - 99 mg/dL 88 85 86  BUN 7 - 25 mg/dL 25 51(W) 25(E)  Creatinine 0.60 - 0.93 mg/dL 5.27(P) 8.24(M) 3.53(I)  BUN/Creat Ratio 6 - 22 (calc) 20 26(H) 22  Sodium 135 - 146 mmol/L 139 140 140  Potassium 3.5 - 5.3  mmol/L 4.3 4.4 4.6  Chloride 98 - 110 mmol/L 106 106 103  CO2 20 - 32 mmol/L 22 27 28   Calcium 8.6 - 10.4 mg/dL 10.2 10.1 10.1     Current antihypertensive regimen:   Amlodipine 5mg  daily  Triamterene/HCTZ 37.5-25mg  daily   Third unsuccessful telephone outreach was attempted today. The patient was referred to the pharmacist for assistance with care management and care coordination.    Follow-Up:  Pharmacist Review   Charlann Lange, [place] Pharmacist Assistant [phone]

## 2020-09-08 ENCOUNTER — Encounter: Payer: Medicare Other | Admitting: Family Medicine

## 2020-09-18 ENCOUNTER — Telehealth: Payer: Self-pay | Admitting: Pharmacist

## 2020-09-18 NOTE — Progress Notes (Signed)
    Chronic Care Management Pharmacy Assistant   Name: [name]  MRN: [id number] DOB: 12-11-1940  Reason for Encounter: Adherence Review  Patient Questions:  1.  Have you seen any other providers since your last visit? No.   2.  Any changes in your medicines or health? No.   PCP : Alycia Rossetti, MD  Verified Adherence Gap Information.Per insurance data patient has met their annual wellness and wellness bundle screening. Their most recent blood pressure was 138/74 on 06/08/20. The patient met their blood pressure goal screening of keeping their blood pressure less than 140/90.   Follow-Up:  Pharmacist Review

## 2020-09-29 ENCOUNTER — Telehealth: Payer: Self-pay | Admitting: Pharmacist

## 2020-09-29 NOTE — Progress Notes (Addendum)
Chronic Care Management Pharmacy Assistant   Name: [name]  MRN: [id number] DOB: Aug 29, 1941  Reason for Encounter: Disease State For HTN.  Patient Questions:  1.  Have you seen any other providers since your last visit? Yes.    2.  Any changes in your medicines or health? No.   PCP : Maria Rossetti, MD  Their chronic conditions include: hypertension, GERD, osteoarthritis, hyperlipidemia, and gout.  Office Visits: 08/03/20 [name] Tria for cough/possible COVID exposure. No information given.  Consults:None since 08/31/20  Allergies:  No Known Allergies  Medications: Outpatient Encounter Medications as of 09/29/2020  Medication Sig   albuterol (VENTOLIN HFA) 108 (90 Base) MCG/ACT inhaler INHALE TWO PUFFS INTO LUNGS EVERY SIX HOURS AS NEEDED FOR WHEEZING OR SHORTNESS OF BREATH.   allopurinol (ZYLOPRIM) 100 MG tablet Take 2 tablets by mouth once daily   amLODipine (NORVASC) 5 MG tablet Take 1 tablet by mouth once daily   aspirin EC 81 MG tablet Take 81 mg by mouth daily.     atorvastatin (LIPITOR) 10 MG tablet Take 1 tablet (10 mg total) by mouth daily.   benzonatate (TESSALON) 100 MG capsule Take 1 capsule by mouth three times daily as needed for cough   diclofenac sodium (VOLTAREN) 1 % GEL Apply 2 g topically 4 (four) times daily. Pain   fluticasone (FLONASE) 50 MCG/ACT nasal spray Place 2 sprays into both nostrils daily.   loratadine (CLARITIN) 10 MG tablet Take 1 tablet (10 mg total) by mouth daily as needed for allergies.   ondansetron (ZOFRAN ODT) 4 MG disintegrating tablet Take 1 tablet (4 mg total) by mouth every 6 (six) hours as needed for nausea or vomiting.   polyethylene glycol powder (GLYCOLAX/MIRALAX) 17 GM/SCOOP powder Take 17 g by mouth daily as needed.   triamterene-hydrochlorothiazide (MAXZIDE-25) 37.5-25 MG tablet Take 1 tablet by mouth once daily   No facility-administered encounter medications on file as of 09/29/2020.    Current  Diagnosis: Patient Active Problem List   Diagnosis Date Noted   CKD (chronic kidney disease), stage II 01/28/2020   Constipation 09/30/2019   Gout 04/01/2016   Paresthesia of bilateral legs 11/26/2013   Vitamin D deficiency 04/14/2013   Class 2 obesity 07/13/2012   OA (osteoarthritis) of knee 03/13/2012   Mild hyperlipidemia 12/13/2011   GERD (gastroesophageal reflux disease) 11/16/2011   Essential hypertension, benign 11/15/2011    Goals Addressed   None    Reviewed chart prior to disease state call. Spoke with patient regarding BP  Recent Office Vitals: BP Readings from Last 3 Encounters:  06/08/20 138/74  01/28/20 138/78  09/30/19 132/76   Pulse Readings from Last 3 Encounters:  06/08/20 80  01/28/20 86  09/30/19 62    Wt Readings from Last 3 Encounters:  06/08/20 210 lb (95.3 kg)  01/28/20 209 lb (94.8 kg)  09/30/19 200 lb (90.7 kg)     Kidney Function Lab Results  Component Value Date/Time   CREATININE 1.25 (H) 06/08/2020 08:32 AM   CREATININE 1.20 (H) 01/28/2020 08:24 AM   GFRNONAA 44 (L) 01/28/2020 08:24 AM   GFRAA 50 (L) 01/28/2020 08:24 AM    BMP Latest Ref Rng & Units 06/08/2020 01/28/2020 09/16/2019  Glucose 65 - 99 mg/dL 88 85 86  BUN 7 - 25 mg/dL 25 31(H) 27(H)  Creatinine 0.60 - 0.93 mg/dL 1.25(H) 1.20(H) 1.22(H)  BUN/Creat Ratio 6 - 22 (calc) 20 26(H) 22  Sodium 135 - 146 mmol/L 139 140 140  Potassium 3.5 - 5.3 mmol/L 4.3 4.4 4.6  Chloride 98 - 110 mmol/L 106 106 103  CO2 20 - 32 mmol/L 22 27 28   Calcium 8.6 - 10.4 mg/dL 10.2 10.1 10.1    Current antihypertensive regimen:  amlodipine 5 mg daily  triamterene/hctz 37.5-25 mg daily  How often are you checking your Blood Pressure? Patient stated about 3-5x per week   Current home BP readings: Patient stated she doesn't write her blood readings down. She stated her blood pressure range is around. 120-140 / 50-60 ( this is from memory)  What recent interventions/DTPs have been made by any  provider to improve Blood Pressure control since last CPP Visit: None  Any recent hospitalizations or ED visits since last visit with CPP? No.  What diet changes have been made to improve Blood Pressure Control?  Patient stated her appetite has not been good since she had COVID. She stated on a typical day she will eat oatmeal or yogurt in the morning, for lunch a salad or  baked chicken with vegetables. She stated she eats fried foods every once in awhile.   What exercise is being done to improve your Blood Pressure Control?  Patient stated she does house duties like cleaning, cooking but no regular exercising. She stated sometimes she walks with a walker because her right knee bothers her from a old injury.   Adherence Review: Is the patient currently on ACE/ARB medication? No.  Does the patient have >5 day gap between last estimated fill dates? No   Follow-Up:  Pharmacist Review   [name], RMA Clinical Pharmacist Assistant [phone]  10 minutes spent in review, coordination, and documentation.  Reviewed by: [name], PharmD Clinical Pharmacist [facility] [phone]

## 2020-10-30 ENCOUNTER — Ambulatory Visit (INDEPENDENT_AMBULATORY_CARE_PROVIDER_SITE_OTHER): Payer: Medicare HMO | Admitting: Family Medicine

## 2020-10-30 ENCOUNTER — Other Ambulatory Visit: Payer: Self-pay

## 2020-10-30 ENCOUNTER — Encounter: Payer: Self-pay | Admitting: Family Medicine

## 2020-10-30 VITALS — BP 134/70 | HR 80 | Temp 98.7°F | Resp 16 | Ht 61.0 in | Wt 213.0 lb

## 2020-10-30 DIAGNOSIS — K219 Gastro-esophageal reflux disease without esophagitis: Secondary | ICD-10-CM

## 2020-10-30 DIAGNOSIS — N182 Chronic kidney disease, stage 2 (mild): Secondary | ICD-10-CM

## 2020-10-30 DIAGNOSIS — Z0001 Encounter for general adult medical examination with abnormal findings: Secondary | ICD-10-CM | POA: Diagnosis not present

## 2020-10-30 DIAGNOSIS — M170 Bilateral primary osteoarthritis of knee: Secondary | ICD-10-CM

## 2020-10-30 DIAGNOSIS — E785 Hyperlipidemia, unspecified: Secondary | ICD-10-CM | POA: Diagnosis not present

## 2020-10-30 DIAGNOSIS — M1711 Unilateral primary osteoarthritis, right knee: Secondary | ICD-10-CM | POA: Diagnosis not present

## 2020-10-30 DIAGNOSIS — I10 Essential (primary) hypertension: Secondary | ICD-10-CM

## 2020-10-30 DIAGNOSIS — Z0000 Encounter for general adult medical examination without abnormal findings: Secondary | ICD-10-CM

## 2020-10-30 DIAGNOSIS — E559 Vitamin D deficiency, unspecified: Secondary | ICD-10-CM

## 2020-10-30 DIAGNOSIS — Z1159 Encounter for screening for other viral diseases: Secondary | ICD-10-CM | POA: Diagnosis not present

## 2020-10-30 MED ORDER — ATORVASTATIN CALCIUM 10 MG PO TABS: 10.0000 mg | ORAL_TABLET | Freq: Every day | ORAL | 2 refills | Status: DC

## 2020-10-30 MED ORDER — AMLODIPINE BESYLATE 5 MG PO TABS
5.0000 mg | ORAL_TABLET | Freq: Every day | ORAL | 2 refills | Status: DC
Start: 2020-10-30 — End: 2021-04-26

## 2020-10-30 MED ORDER — ALLOPURINOL 100 MG PO TABS: 200.0000 mg | ORAL_TABLET | Freq: Every day | ORAL | 2 refills | Status: AC

## 2020-10-30 MED ORDER — TRIAMTERENE-HCTZ 37.5-25 MG PO TABS
1.0000 | ORAL_TABLET | Freq: Every day | ORAL | 2 refills | Status: DC
Start: 2020-10-30 — End: 2020-11-04

## 2020-10-30 NOTE — Patient Instructions (Addendum)
Okay to schedule with Janett Billow - she will call back  We will call with lab results

## 2020-10-30 NOTE — Progress Notes (Signed)
Subjective:   Patient presents for Medicare Annual/Subsequent preventive examination.   HTN- taking maxzide and norvasc 5mg , BP at home    Gout- rare flares , she is on allopurinol   OA- on naprosyn as needed / voltaren gel   GERD- protonix daily helps control most of symptoms,.   still taking ASA and her calcium and vitamin D   CKD- stage 2, due for recheck    HLD on statin drug    Recent labs reviewed    Review Past Medical/Family/Social:per EMR   Risk Factors Current exercise habits:walks some Dietary issues discussed:Yes  Cardiac risk factors:Obesity (BMI >= 30 kg/m2).HTN  Depression Screen (Note: if answer to either of the following is "Yes", a more complete depression screening is indicated)  Over the past two weeks, have you felt down, depressed or hopeless? No Over the past two weeks, have you felt little interest or pleasure in doing things? No Have you lost interest or pleasure in daily life? No Do you often feel hopeless? No Do you cry easily over simple problems?No   Activities of Daily Living In your present state of health, do you have any difficulty performing the following activities?:  Driving? No  Managing money? No  Feeding yourself? No  Getting from bed to chair? No  Climbing a flight of stairs? No  Preparing food and eating?: No  Bathing or showering? No  Getting dressed: No  Getting to the toilet? No  Using the toilet:No  Moving around from place to place: No  In the past year have you fallen or had a near fall?:No    Hearing Difficulties:No  Do you often ask people to speak up or repeat themselves? No  Do you experience ringing or noises in your ears? No Do you have difficulty understanding soft or whispered voices? No  Do you feel that you have a problem with memory? No Do you often misplace items? No  Do you feel safe at home?Yes  Cognitive Testing Alert? Yes Normal Appearance?Yes  Oriented to  person? Yes Place? Yes  Time? Yes  Recall of three objects? Yes  Can perform simple calculations? Yes  Displays appropriate judgment?Yes  Can read the correct time from a watch face?Yes   List the Names of Other Physician/Practitioners you currently use: Dr. Luna Glasgow- Orthopedics Dr. Berline Lopes[facility] Podiatry [facility] [address]  Screening Tests / Date ColonoscopyDeclines ZostavaxDeclines MammogramUTD, 2020 , declines  Influenza VaccineUTD Tetanus/tdapDeclines Pneumonia- UTD Bone Density- UTD 2020  COVID-19-  Carotid US  2019- normal   ROS: GEN- denies fatigue, fever, weight loss,weakness, recent illness HEENT- denies eye drainage, change in vision, nasal discharge, CVS- denies chest pain, palpitations RESP- denies SOB,+cough, wheeze ABD- denies N/V, change in stools, abd pain GU- denies dysuria, hematuria, dribbling, incontinence MSK-+joint pain, muscle aches, injury Neuro- denies headache, dizziness, syncope, seizure activity  Physical:Vitals reviewed GEN- NAD, alert and oriented x3 HEENT- PERRL, EOMI, non injected sclera, pink conjunctiva, MMM, oropharynx clear Neck- Supple, no thryomegaly, no bruit  CVS- RRR, no murmur RESP-CTAB ABD-NABS,soft,,NT,ND Psych- normal affect and mood  MSK- fair ROM bilat knee, no effusion  EXT- No edema Pulses- Radial, DP- 2+    Assessment:    Annual wellness medicare exam   Plan:    During the course of the visit the patient was educated and counseled about appropriate screening and preventive services including:   Prevention- declines further TDAP/shingles/Colonoscopy  Mammogram UTD, declines further  FALL/DEPRESSION/AUDIT C negative   HTN- much improved, continue maxize  and norvasc 5mg    CKD stage 2- stable, keep hydrated  GERD- continue PPI   Constipation- prn miralax   Gout- controlled with allopurinol, no recent flares   OA knee referral to ortho, topical NSAID  Given  handout on advanced directives  Diet review for nutrition referral? Yes ____ Not Indicated __x__  Patient Instructions (the written plan) was given to the patient.  Medicare Attestation  I have personally reviewed:  The patient's medical and social history  Their use of alcohol, tobacco or illicit drugs  Their current medications and supplements  The patient's functional ability including ADLs,fall risks, home safety risks, cognitive, and hearing and visual impairment  Diet and physical activities  Evidence for depression or mood disorders  The patient's weight, height, BMI, and visual acuity have been recorded in the chart. I have made referrals, counseling, and provided education to the patient based on review of the above and I have provided the patient with a written personalized care plan for preventive services.

## 2020-11-02 LAB — HEPATITIS C ANTIBODY
Hepatitis C Ab: NONREACTIVE
SIGNAL TO CUT-OFF: 0.06 (ref <1.00)

## 2020-11-02 LAB — COMPREHENSIVE METABOLIC PANEL
AG Ratio: 1.1 (calc) (ref 1.0–2.5)
ALT: 14 U/L (ref 6–29)
AST: 39 U/L — ABNORMAL HIGH (ref 10–35)
Albumin: 4.1 g/dL (ref 3.6–5.1)
Alkaline phosphatase (APISO): 69 U/L (ref 37–153)
BUN/Creatinine Ratio: 19 (calc) (ref 6–22)
BUN: 21 mg/dL (ref 7–25)
CO2: 23 mmol/L (ref 20–32)
Calcium: 10.1 mg/dL (ref 8.6–10.4)
Chloride: 104 mmol/L (ref 98–110)
Creat: 1.1 mg/dL — ABNORMAL HIGH (ref 0.60–0.93)
Globulin: 3.6 g/dL (calc) (ref 1.9–3.7)
Glucose, Bld: 82 mg/dL (ref 65–99)
Potassium: 4.1 mmol/L (ref 3.5–5.3)
Sodium: 139 mmol/L (ref 135–146)
Total Bilirubin: 0.6 mg/dL (ref 0.2–1.2)
Total Protein: 7.7 g/dL (ref 6.1–8.1)

## 2020-11-02 LAB — CBC WITH DIFFERENTIAL/PLATELET
Absolute Monocytes: 376 cells/uL (ref 200–950)
Basophils Absolute: 28 cells/uL (ref 0–200)
Basophils Relative: 0.6 %
Eosinophils Absolute: 80 cells/uL (ref 15–500)
Eosinophils Relative: 1.7 %
HCT: 38.7 % (ref 35.0–45.0)
Hemoglobin: 12.9 g/dL (ref 11.7–15.5)
Lymphs Abs: 1922 cells/uL (ref 850–3900)
MCH: 33.4 pg — ABNORMAL HIGH (ref 27.0–33.0)
MCHC: 33.3 g/dL (ref 32.0–36.0)
MCV: 100.3 fL — ABNORMAL HIGH (ref 80.0–100.0)
MPV: 10.7 fL (ref 7.5–12.5)
Monocytes Relative: 8 %
Neutro Abs: 2294 cells/uL (ref 1500–7800)
Neutrophils Relative %: 48.8 %
Platelets: 271 10*3/uL (ref 140–400)
RBC: 3.86 10*6/uL (ref 3.80–5.10)
RDW: 11.1 % (ref 11.0–15.0)
Total Lymphocyte: 40.9 %
WBC: 4.7 10*3/uL (ref 3.8–10.8)

## 2020-11-02 LAB — VITAMIN D 25 HYDROXY (VIT D DEFICIENCY, FRACTURES): Vit D, 25-Hydroxy: 42 ng/mL (ref 30–100)

## 2020-11-02 LAB — LIPID PANEL
Cholesterol: 161 mg/dL (ref <200)
HDL: 75 mg/dL (ref >=50)
LDL Cholesterol (Calc): 73 mg/dL (calc)
Non-HDL Cholesterol (Calc): 86 mg/dL (calc) (ref <130)
Total CHOL/HDL Ratio: 2.1 (calc) (ref <5.0)
Triglycerides: 54 mg/dL (ref <150)

## 2020-11-03 ENCOUNTER — Ambulatory Visit: Payer: Medicare HMO

## 2020-11-03 ENCOUNTER — Encounter: Payer: Self-pay | Admitting: Orthopaedic Surgery

## 2020-11-03 ENCOUNTER — Ambulatory Visit (INDEPENDENT_AMBULATORY_CARE_PROVIDER_SITE_OTHER): Payer: Medicare HMO | Admitting: Orthopaedic Surgery

## 2020-11-03 ENCOUNTER — Other Ambulatory Visit: Payer: Self-pay

## 2020-11-03 VITALS — BP 175/101 | HR 86 | Ht 62.0 in | Wt 213.0 lb

## 2020-11-03 DIAGNOSIS — M25561 Pain in right knee: Secondary | ICD-10-CM | POA: Diagnosis not present

## 2020-11-03 DIAGNOSIS — G8929 Other chronic pain: Secondary | ICD-10-CM | POA: Diagnosis not present

## 2020-11-03 NOTE — Progress Notes (Signed)
Subjective:    Patient ID: [name], female    DOB: 10-Mar-1941, 80 y.o.   MRN: [id number]  HPI She has increasing pain of the right knee over the last six months or so.  She has popping, swelling and more recently feeling like the knee will give way but it has not given way yet.  She has tried ice, rubs and rest. She has reflux and uses Tylenol but that does not help.  She was seen by Dr. Buelah Manis on 10-30-20 and I have reviewed her notes.  The patient has no trauma, no redness, no numbness.  The pain often awakens her at night.   Review of Systems  Constitutional: Positive for activity change.  Respiratory: Positive for shortness of breath.   Musculoskeletal: Positive for arthralgias, gait problem and joint swelling.  All other systems reviewed and are negative.  For Review of Systems, all other systems reviewed and are negative.  The following is a summary of the past history medically, past history surgically, known current medicines, social history and family history.  This information is gathered electronically by the computer from prior information and documentation.  I review this each visit and have found including this information at this point in the chart is beneficial and informative.   Past Medical History:  Diagnosis Date  . Acid reflux   . Arthritis   . Gout   . Gout   . Hypertension   . Shortness of breath     Past Surgical History:  Procedure Laterality Date  . MASS EXCISION N/A 04/11/2014   Procedure: EXCISION SOFT TISSUE NEOPLASM,  BACK;  Surgeon: Jamesetta So, MD;  Location: [facility];  Service: General;  Laterality: N/A;  . TONSILLECTOMY    . TUBAL LIGATION      Current Outpatient Medications on File Prior to Visit  Medication Sig Dispense Refill  . albuterol (VENTOLIN HFA) 108 (90 Base) MCG/ACT inhaler INHALE TWO PUFFS INTO LUNGS EVERY SIX HOURS AS NEEDED FOR WHEEZING OR SHORTNESS OF BREATH. 18 g 1  . allopurinol (ZYLOPRIM) 100 MG tablet Take 2  tablets (200 mg total) by mouth daily. 180 tablet 2  . amLODipine (NORVASC) 5 MG tablet Take 1 tablet (5 mg total) by mouth daily. 90 tablet 2  . aspirin EC 81 MG tablet Take 81 mg by mouth daily.    Marland Kitchen atorvastatin (LIPITOR) 10 MG tablet Take 1 tablet (10 mg total) by mouth daily. 90 tablet 2  . diclofenac sodium (VOLTAREN) 1 % GEL Apply 2 g topically 4 (four) times daily. Pain 100 g 3  . fluticasone (FLONASE) 50 MCG/ACT nasal spray Place 2 sprays into both nostrils daily. 16 g 6  . loratadine (CLARITIN) 10 MG tablet Take 1 tablet (10 mg total) by mouth daily as needed for allergies. 30 tablet 11  . polyethylene glycol powder (GLYCOLAX/MIRALAX) 17 GM/SCOOP powder Take 17 g by mouth daily as needed. 3350 g 1  . triamterene-hydrochlorothiazide (MAXZIDE-25) 37.5-25 MG tablet Take 1 tablet by mouth daily. 90 tablet 2   No current facility-administered medications on file prior to visit.    Social History   Socioeconomic History  . Marital status: Widowed    Spouse name: Not on file  . Number of children: Not on file  . Years of education: Not on file  . Highest education level: Not on file  Occupational History  . Not on file  Tobacco Use  . Smoking status: Never Smoker  . Smokeless tobacco:  Never Used  Vaping Use  . Vaping Use: Never used  Substance and Sexual Activity  . Alcohol use: No  . Drug use: No  . Sexual activity: Yes    Birth control/protection: Post-menopausal  Other Topics Concern  . Not on file  Social History Narrative  . Not on file   Social Determinants of Health   Financial Resource Strain: Low Risk   . Difficulty of Paying Living Expenses: Not very hard  Food Insecurity: Not on file  Transportation Needs: Not on file  Physical Activity: Not on file  Stress: Not on file  Social Connections: Not on file  Intimate Partner Violence: Not on file    Family History  Problem Relation Age of Onset  . Cancer Mother   . Diabetes Father     BP (!) 175/101    Pulse 86   Ht 5\' 2"  (1.575 m)   Wt 213 lb (96.6 kg)   BMI 38.96 kg/m   Body mass index is 38.96 kg/m.      Objective:   Physical Exam Vitals and nursing note reviewed. Exam conducted with a chaperone present.  Constitutional:      Appearance: She is well-developed and well-nourished.  HENT:     Head: Normocephalic and atraumatic.  Eyes:     Extraocular Movements: EOM normal.     Conjunctiva/sclera: Conjunctivae normal.     Pupils: Pupils are equal, round, and reactive to light.  Cardiovascular:     Rate and Rhythm: Normal rate and regular rhythm.     Pulses: Intact distal pulses.  Pulmonary:     Effort: Pulmonary effort is normal.  Abdominal:     Palpations: Abdomen is soft.  Musculoskeletal:     Cervical back: Normal range of motion and neck supple.       Legs:  Skin:    General: Skin is warm and dry.  Neurological:     Mental Status: She is alert and oriented to person, place, and time.     Cranial Nerves: No cranial nerve deficit.     Motor: No abnormal muscle tone.     Coordination: Coordination normal.     Deep Tendon Reflexes: Reflexes are normal and symmetric. Reflexes normal.  Psychiatric:        Mood and Affect: Mood and affect normal.        Behavior: Behavior normal.        Thought Content: Thought content normal.        Judgment: Judgment normal.    X-rays were done of the right knee, reported separately.  Moderated DJD changes, tricompartmental, more medially.       Assessment & Plan:   Encounter Diagnosis  Name Primary?  . Chronic pain of right knee Yes   PROCEDURE NOTE:  The patient requests injections of the right knee , verbal consent was obtained.  The right knee was prepped appropriately after time out was performed.   Sterile technique was observed and injection of 1 cc of Celestone 6 mg with several cc's of plain xylocaine. Anesthesia was provided by ethyl chloride and a 20-gauge needle was used to inject the knee area. The  injection was tolerated well.  A band aid dressing was applied.  The patient was advised to apply ice later today and tomorrow to the injection sight as needed.  Use the rubs.  She may be candidate later for total knee.  Return in three weeks.  Call if any problem.  Precautions discussed.  Electronically Signed Sanjuana Kava, MD 3/8/20229:37 AM

## 2020-11-04 ENCOUNTER — Other Ambulatory Visit: Payer: Self-pay | Admitting: Family Medicine

## 2020-11-19 ENCOUNTER — Encounter: Payer: Self-pay | Admitting: Nurse Practitioner

## 2020-11-19 ENCOUNTER — Ambulatory Visit (INDEPENDENT_AMBULATORY_CARE_PROVIDER_SITE_OTHER): Payer: Medicare HMO | Admitting: Nurse Practitioner

## 2020-11-19 ENCOUNTER — Other Ambulatory Visit: Payer: Self-pay

## 2020-11-19 DIAGNOSIS — M170 Bilateral primary osteoarthritis of knee: Secondary | ICD-10-CM | POA: Diagnosis not present

## 2020-11-19 DIAGNOSIS — E785 Hyperlipidemia, unspecified: Secondary | ICD-10-CM | POA: Diagnosis not present

## 2020-11-19 DIAGNOSIS — J4530 Mild persistent asthma, uncomplicated: Secondary | ICD-10-CM

## 2020-11-19 DIAGNOSIS — K219 Gastro-esophageal reflux disease without esophagitis: Secondary | ICD-10-CM

## 2020-11-19 DIAGNOSIS — M1A00X0 Idiopathic chronic gout, unspecified site, without tophus (tophi): Secondary | ICD-10-CM

## 2020-11-19 DIAGNOSIS — Z7689 Persons encountering health services in other specified circumstances: Secondary | ICD-10-CM | POA: Diagnosis not present

## 2020-11-19 DIAGNOSIS — N182 Chronic kidney disease, stage 2 (mild): Secondary | ICD-10-CM | POA: Diagnosis not present

## 2020-11-19 DIAGNOSIS — I10 Essential (primary) hypertension: Secondary | ICD-10-CM

## 2020-11-19 DIAGNOSIS — J45909 Unspecified asthma, uncomplicated: Secondary | ICD-10-CM | POA: Insufficient documentation

## 2020-11-19 NOTE — Assessment & Plan Note (Addendum)
Lab Results  Component Value Date   CREATININE 1.10 (H) 10/30/2020   BUN 21 10/30/2020   NA 139 10/30/2020   K 4.1 10/30/2020   CL 104 10/30/2020   CO2 23 10/30/2020   -Cr elevated; last GFR was 50

## 2020-11-19 NOTE — Progress Notes (Signed)
New Patient Office Visit  Subjective:  Patient ID: [name], female    DOB: Jul 26, 1941  Age: 80 y.o. MRN: [id number]  CC:  Chief Complaint  Patient presents with  . New Patient (Initial Visit)    HPI [name] presents for new patient visit. Transferring care from Dr. Buelah Manis. Last physical was 10/30/20, and labs were drawn at that time.  No acute concerns today.  Past Medical History:  Diagnosis Date  . Acid reflux   . Arthritis    right knee pain  . Gout   . Gout   . Hypertension   . Shortness of breath     Past Surgical History:  Procedure Laterality Date  . MASS EXCISION N/A 04/11/2014   Procedure: EXCISION SOFT TISSUE NEOPLASM,  BACK;  Surgeon: Jamesetta So, MD;  Location: [facility];  Service: General;  Laterality: N/A;  . TONSILLECTOMY    . TUBAL LIGATION      Family History  Problem Relation Age of Onset  . Cancer Mother   . Diabetes Father     Social History   Socioeconomic History  . Marital status: Widowed    Spouse name: Not on file  . Number of children: Not on file  . Years of education: Not on file  . Highest education level: Not on file  Occupational History  . 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 Smoking status: Never Smoker  . Smokeless tobacco: Never Used  Vaping Use  . Vaping Use: Never used  Substance and Sexual Activity  . Alcohol use: No  . Drug use: No  . Sexual activity: Yes    Birth control/protection: Post-menopausal  Other Topics Concern  . Not on file  Social History Narrative  . Not on file   Social Determinants of Health   Financial Resource Strain: Low Risk   . Difficulty of Paying Living Expenses: Not very hard  Food Insecurity: Not on file  Transportation Needs: Not on file  Physical Activity: Not on file  Stress: Not on file  Social Connections: Not on file  Intimate Partner Violence: Not on file    ROS Review of Systems   Constitutional: Negative.   Respiratory: Negative.   Cardiovascular: Negative.   Musculoskeletal: Positive for arthralgias.       Right knee pain; followed by Dr. Luna Glasgow; has hx of gout as well  Psychiatric/Behavioral: Negative.     Objective:   Today's Vitals: BP (!) 147/85   Pulse 88   Temp 98.7 F (37.1 C)   Resp 18   Ht _0  (1.575 m)   Wt 212 lb (96.2 kg)   SpO2 95%   BMI 38.78 kg/m   Physical Exam Constitutional:      Appearance: She is obese.  Cardiovascular:     Rate and Rhythm: Normal rate and regular rhythm.     Pulses: Normal pulses.     Heart sounds: Normal heart sounds.  Pulmonary:     Effort: Pulmonary effort is normal.     Breath sounds: Normal breath sounds.  Neurological:     Mental Status: She is alert.  Psychiatric:        Mood and Affect: Mood normal.        Behavior: Behavior normal.        Thought Content: Thought content normal.        Judgment: Judgment normal.  Assessment & Plan:   Problem List Items Addressed This Visit      Cardiovascular and Mediastinum   Essential hypertension, benign    -BP slightly elevated today at 147/85 -taking amlodipine and triamterene-HCTZ      Relevant Orders   CBC with Differential/Platelet   CMP14+EGFR   Lipid Panel With LDL/HDL Ratio     Respiratory   Asthma    -uses albuterol 1-2 x per week        Digestive   GERD (gastroesophageal reflux disease)    -no current medications        Musculoskeletal and Integument   OA (osteoarthritis) of knee    Uses diclofenac QID PRN        Genitourinary   CKD (chronic kidney disease), stage II    Lab Results  Component Value Date   CREATININE 1.10 (H) 10/30/2020   BUN 21 10/30/2020   NA 139 10/30/2020   K 4.1 10/30/2020   CL 104 10/30/2020   CO2 23 10/30/2020   -Cr elevated; last GFR was 50      Relevant Orders   CMP14+EGFR     Other   Mild hyperlipidemia    Lab Results  Component Value Date   CHOL 161 10/30/2020   HDL 75  10/30/2020   LDLCALC 73 10/30/2020   TRIG 54 10/30/2020   CHOLHDL 2.1 10/30/2020   -takes atorvastatin -LDL at goal      Relevant Orders   CBC with Differential/Platelet   CMP14+EGFR   Lipid Panel With LDL/HDL Ratio   Gout    -takes allopurinol -doing well with this      Relevant Orders   Uric acid   Encounter to establish care    -briefly reviewed records from Dr. Buelah Manis; records in chart -preventative maintenance up to date      Relevant Orders   CBC with Differential/Platelet   CMP14+EGFR   Lipid Panel With LDL/HDL Ratio   Uric acid      Outpatient Encounter Medications as of 11/19/2020  Medication Sig  . albuterol (VENTOLIN HFA) 108 (90 Base) MCG/ACT inhaler INHALE TWO PUFFS INTO LUNGS EVERY SIX HOURS AS NEEDED FOR WHEEZING OR SHORTNESS OF BREATH.  Marland Kitchen allopurinol (ZYLOPRIM) 100 MG tablet Take 2 tablets (200 mg total) by mouth daily.  Marland Kitchen amLODipine (NORVASC) 5 MG tablet Take 1 tablet (5 mg total) by mouth daily.  Marland Kitchen aspirin EC 81 MG tablet Take 81 mg by mouth daily.  Marland Kitchen atorvastatin (LIPITOR) 10 MG tablet Take 1 tablet (10 mg total) by mouth daily.  . diclofenac sodium (VOLTAREN) 1 % GEL Apply 2 g topically 4 (four) times daily. Pain  . fluticasone (FLONASE) 50 MCG/ACT nasal spray Place 2 sprays into both nostrils daily.  Marland Kitchen loratadine (CLARITIN) 10 MG tablet Take 1 tablet (10 mg total) by mouth daily as needed for allergies.  . polyethylene glycol powder (GLYCOLAX/MIRALAX) 17 GM/SCOOP powder Take 17 g by mouth daily as needed.  . triamterene-hydrochlorothiazide (MAXZIDE-25) 37.5-25 MG tablet Take 1 tablet by mouth once daily   No facility-administered encounter medications on file as of 11/19/2020.    Follow-up: Return in about 4 months (around 03/21/2021) for Lab follow-up (HLD, gout, HTN).   Noreene Larsson, NP

## 2020-11-19 NOTE — Patient Instructions (Signed)
Please have fasting labs drawn 2-3 days prior to your appointment so we can discuss the results during your office visit.  

## 2020-11-19 NOTE — Assessment & Plan Note (Addendum)
-  briefly reviewed records from Dr. Buelah Manis; records in chart -preventative maintenance up to date

## 2020-11-19 NOTE — Assessment & Plan Note (Signed)
-  uses albuterol 1-2 x per week

## 2020-11-19 NOTE — Assessment & Plan Note (Signed)
-  BP slightly elevated today at 147/85 -taking amlodipine and triamterene-HCTZ

## 2020-11-19 NOTE — Assessment & Plan Note (Signed)
-  takes allopurinol -doing well with this

## 2020-11-19 NOTE — Assessment & Plan Note (Signed)
Uses diclofenac QID PRN

## 2020-11-19 NOTE — Assessment & Plan Note (Signed)
Lab Results  Component Value Date   CHOL 161 10/30/2020   HDL 75 10/30/2020   LDLCALC 73 10/30/2020   TRIG 54 10/30/2020   CHOLHDL 2.1 10/30/2020   -takes atorvastatin -LDL at goal

## 2020-11-19 NOTE — Assessment & Plan Note (Signed)
-  no current medications

## 2020-11-24 ENCOUNTER — Other Ambulatory Visit: Payer: Self-pay

## 2020-11-24 ENCOUNTER — Encounter: Payer: Self-pay | Admitting: Orthopaedic Surgery

## 2020-11-24 ENCOUNTER — Ambulatory Visit: Payer: Medicare HMO | Admitting: Orthopaedic Surgery

## 2020-11-24 DIAGNOSIS — M25561 Pain in right knee: Secondary | ICD-10-CM | POA: Diagnosis not present

## 2020-11-24 DIAGNOSIS — G8929 Other chronic pain: Secondary | ICD-10-CM | POA: Diagnosis not present

## 2020-11-24 NOTE — Progress Notes (Signed)
PROCEDURE NOTE:  The patient requests injections of the right knee , verbal consent was obtained.  The right knee was prepped appropriately after time out was performed.   Sterile technique was observed and injection of 1 cc of Celestone 6 mg with several cc's of plain xylocaine. Anesthesia was provided by ethyl chloride and a 20-gauge needle was used to inject the knee area. The injection was tolerated well.  A band aid dressing was applied.  The patient was advised to apply ice later today and tomorrow to the injection sight as needed.  Return in one month.  Call if any problem.  Precautions discussed.   Electronically Signed Sanjuana Kava, MD 3/29/20229:00 AM

## 2020-12-22 ENCOUNTER — Telehealth: Payer: Self-pay | Admitting: Orthopaedic Surgery

## 2020-12-22 ENCOUNTER — Encounter: Payer: Self-pay | Admitting: Orthopaedic Surgery

## 2020-12-22 ENCOUNTER — Ambulatory Visit: Payer: Medicare HMO | Admitting: Orthopaedic Surgery

## 2020-12-22 ENCOUNTER — Other Ambulatory Visit: Payer: Self-pay

## 2020-12-22 DIAGNOSIS — G8929 Other chronic pain: Secondary | ICD-10-CM

## 2020-12-22 DIAGNOSIS — M25561 Pain in right knee: Secondary | ICD-10-CM | POA: Diagnosis not present

## 2020-12-22 NOTE — Telephone Encounter (Signed)
Lethea Killings called and states that we need to call the insurance company [employer] to request that authorization for that service not the patient  Holland Falling # [phone]   Any questions please call Charlene at [phone]

## 2020-12-22 NOTE — Progress Notes (Signed)
PROCEDURE NOTE:  The patient requests injections of the right knee , verbal consent was obtained.  The right knee was prepped appropriately after time out was performed.   Sterile technique was observed and injection of 1 cc of Celestone 6 mg with several cc's of plain xylocaine. Anesthesia was provided by ethyl chloride and a 20-gauge needle was used to inject the knee area. The injection was tolerated well.  A band aid dressing was applied.  The patient was advised to apply ice later today and tomorrow to the injection sight as needed.  We talked about viscosupplementation.  Return prn.  Call if any problem.  Precautions discussed.   Electronically Signed Sanjuana Kava, MD 4/26/20229:54 AM

## 2020-12-25 ENCOUNTER — Telehealth: Payer: Self-pay

## 2021-01-26 DIAGNOSIS — H5200 Hypermetropia, unspecified eye: Secondary | ICD-10-CM | POA: Diagnosis not present

## 2021-01-26 DIAGNOSIS — Z0100 Encounter for examination of eyes and vision without abnormal findings: Secondary | ICD-10-CM | POA: Diagnosis not present

## 2021-01-29 DIAGNOSIS — G8929 Other chronic pain: Secondary | ICD-10-CM | POA: Diagnosis not present

## 2021-01-29 DIAGNOSIS — Z7722 Contact with and (suspected) exposure to environmental tobacco smoke (acute) (chronic): Secondary | ICD-10-CM | POA: Diagnosis not present

## 2021-01-29 DIAGNOSIS — I10 Essential (primary) hypertension: Secondary | ICD-10-CM | POA: Diagnosis not present

## 2021-01-29 DIAGNOSIS — Z008 Encounter for other general examination: Secondary | ICD-10-CM | POA: Diagnosis not present

## 2021-01-29 DIAGNOSIS — M1990 Unspecified osteoarthritis, unspecified site: Secondary | ICD-10-CM | POA: Diagnosis not present

## 2021-01-29 DIAGNOSIS — J45909 Unspecified asthma, uncomplicated: Secondary | ICD-10-CM | POA: Diagnosis not present

## 2021-01-29 DIAGNOSIS — M109 Gout, unspecified: Secondary | ICD-10-CM | POA: Diagnosis not present

## 2021-01-29 DIAGNOSIS — E785 Hyperlipidemia, unspecified: Secondary | ICD-10-CM | POA: Diagnosis not present

## 2021-01-29 DIAGNOSIS — Z6841 Body Mass Index (BMI) 40.0 and over, adult: Secondary | ICD-10-CM | POA: Diagnosis not present

## 2021-01-29 DIAGNOSIS — Z809 Family history of malignant neoplasm, unspecified: Secondary | ICD-10-CM | POA: Diagnosis not present

## 2021-02-16 ENCOUNTER — Telehealth: Payer: Self-pay | Admitting: Orthopaedic Surgery

## 2021-02-16 NOTE — Telephone Encounter (Signed)
Patient called to speak with Inez Catalina to follow up on 'gel' injections; states she knows her insurance would need to give authorization. Please call patient [phone].

## 2021-03-11 ENCOUNTER — Encounter: Payer: Self-pay | Admitting: Orthopaedic Surgery

## 2021-03-11 ENCOUNTER — Ambulatory Visit: Payer: Medicare HMO | Admitting: Orthopaedic Surgery

## 2021-03-11 ENCOUNTER — Other Ambulatory Visit: Payer: Self-pay

## 2021-03-11 DIAGNOSIS — M1711 Unilateral primary osteoarthritis, right knee: Secondary | ICD-10-CM | POA: Diagnosis not present

## 2021-03-11 DIAGNOSIS — G8929 Other chronic pain: Secondary | ICD-10-CM

## 2021-03-11 NOTE — Progress Notes (Signed)
This patient is diagnosed with osteoarthritis of the knee(s).    Radiographs show evidence of joint space narrowing, osteophytes, subchondral sclerosis and/or subchondral cysts.  This patient has knee pain which interferes with functional and activities of daily living.    This patient has experienced inadequate response, adverse effects and/or intolerance with conservative treatments such as acetaminophen, NSAIDS, topical creams, physical therapy or regular exercise, knee bracing and/or weight loss.   This patient has experienced inadequate response or has a contraindication to intra articular steroid injections for at least 3 months.   This patient is not scheduled to have a total knee replacement within 6 months of starting treatment with viscosupplementation.  PROCEDURE NOTE:  Synvisc injection #  1 of 3   Injection 1 vial of Orthovisc into right  knee  There were no vitals taken for this visit.  The right knee exam: there was no synovitis or infection   The knee was prepped sterilely  Ethyl chloride was used to anesthetize the skin A 20 g needle was used to inject the knee with 1 vial of Synvisc A sterile dressing was placed  There were no complications  Follow up one week   Call if any problem.  Precautions discussed.  Electronically Signed Sanjuana Kava, MD 7/14/20229:26 AM

## 2021-03-18 ENCOUNTER — Encounter: Payer: Self-pay | Admitting: Orthopaedic Surgery

## 2021-03-18 ENCOUNTER — Ambulatory Visit: Payer: Medicare HMO | Admitting: Orthopaedic Surgery

## 2021-03-18 ENCOUNTER — Other Ambulatory Visit: Payer: Self-pay

## 2021-03-18 DIAGNOSIS — G8929 Other chronic pain: Secondary | ICD-10-CM

## 2021-03-18 DIAGNOSIS — M1711 Unilateral primary osteoarthritis, right knee: Secondary | ICD-10-CM | POA: Diagnosis not present

## 2021-03-18 DIAGNOSIS — M25561 Pain in right knee: Secondary | ICD-10-CM

## 2021-03-18 NOTE — Progress Notes (Signed)
This patient is diagnosed with osteoarthritis of the knee(s).    Radiographs show evidence of joint space narrowing, osteophytes, subchondral sclerosis and/or subchondral cysts.  This patient has knee pain which interferes with functional and activities of daily living.    This patient has experienced inadequate response, adverse effects and/or intolerance with conservative treatments such as acetaminophen, NSAIDS, topical creams, physical therapy or regular exercise, knee bracing and/or weight loss.   This patient has experienced inadequate response or has a contraindication to intra articular steroid injections for at least 3 months.   This patient is not scheduled to have a total knee replacement within 6 months of starting treatment with viscosupplementation.   PROCEDURE NOTE:  Synvisc injection #  2 of 3   Injection 1 vial of Synvisc into right  knee  There were no vitals taken for this visit.  The right knee exam: there was no synovitis or infection   The knee was prepped sterilely  Ethyl chloride was used to anesthetize the skin A 20 g needle was used to inject the knee with 1 vial of Orthovisc A sterile dressing was placed  There were no complications  Follow up one week  Call if any problem.  Precautions discussed.  Electronically Signed Sanjuana Kava, MD 7/21/202210:06 AM

## 2021-03-22 DIAGNOSIS — Z7689 Persons encountering health services in other specified circumstances: Secondary | ICD-10-CM | POA: Diagnosis not present

## 2021-03-22 DIAGNOSIS — R002 Palpitations: Secondary | ICD-10-CM | POA: Diagnosis not present

## 2021-03-22 DIAGNOSIS — E785 Hyperlipidemia, unspecified: Secondary | ICD-10-CM | POA: Diagnosis not present

## 2021-03-22 DIAGNOSIS — I10 Essential (primary) hypertension: Secondary | ICD-10-CM | POA: Diagnosis not present

## 2021-03-22 DIAGNOSIS — N182 Chronic kidney disease, stage 2 (mild): Secondary | ICD-10-CM | POA: Diagnosis not present

## 2021-03-22 DIAGNOSIS — M1A00X0 Idiopathic chronic gout, unspecified site, without tophus (tophi): Secondary | ICD-10-CM | POA: Diagnosis not present

## 2021-03-23 ENCOUNTER — Telehealth: Payer: Self-pay | Admitting: Nurse Practitioner

## 2021-03-23 NOTE — Progress Notes (Signed)
Labs are good. Kidney function is a little low, but it is stable from her last lab draw.

## 2021-03-23 NOTE — Telephone Encounter (Signed)
Returning your call. °

## 2021-03-24 LAB — LIPID PANEL WITH LDL/HDL RATIO
Cholesterol, Total: 175 mg/dL (ref 100–199)
HDL: 73 mg/dL (ref >39)
LDL Chol Calc (NIH): 90 mg/dL (ref 0–99)
LDL/HDL Ratio: 1.2 ratio (ref 0.0–3.2)
Triglycerides: 62 mg/dL (ref 0–149)
VLDL Cholesterol Cal: 12 mg/dL (ref 5–40)

## 2021-03-24 LAB — CBC WITH DIFFERENTIAL/PLATELET
Basophils Absolute: 0 10*3/uL (ref 0.0–0.2)
Basos: 1 %
EOS (ABSOLUTE): 0.1 10*3/uL (ref 0.0–0.4)
Eos: 1 %
Hematocrit: 39.3 % (ref 34.0–46.6)
Hemoglobin: 13.3 g/dL (ref 11.1–15.9)
Immature Grans (Abs): 0 10*3/uL (ref 0.0–0.1)
Immature Granulocytes: 0 %
Lymphocytes Absolute: 2.2 10*3/uL (ref 0.7–3.1)
Lymphs: 43 %
MCH: 33.6 pg — ABNORMAL HIGH (ref 26.6–33.0)
MCHC: 33.8 g/dL (ref 31.5–35.7)
MCV: 99 fL — ABNORMAL HIGH (ref 79–97)
Monocytes Absolute: 0.4 10*3/uL (ref 0.1–0.9)
Monocytes: 8 %
Neutrophils Absolute: 2.4 10*3/uL (ref 1.4–7.0)
Neutrophils: 47 %
Platelets: 283 10*3/uL (ref 150–450)
RBC: 3.96 x10E6/uL (ref 3.77–5.28)
RDW: 10.8 % — ABNORMAL LOW (ref 11.7–15.4)
WBC: 5.1 10*3/uL (ref 3.4–10.8)

## 2021-03-24 LAB — CMP14+EGFR
ALT: 14 IU/L (ref 0–32)
AST: 40 IU/L (ref 0–40)
Albumin/Globulin Ratio: 1.4 (ref 1.2–2.2)
Albumin: 4.6 g/dL (ref 3.7–4.7)
Alkaline Phosphatase: 78 IU/L (ref 44–121)
BUN/Creatinine Ratio: 24 (ref 12–28)
BUN: 27 mg/dL (ref 8–27)
Bilirubin Total: 0.6 mg/dL (ref 0.0–1.2)
CO2: 23 mmol/L (ref 20–29)
Calcium: 10.3 mg/dL (ref 8.7–10.3)
Chloride: 104 mmol/L (ref 96–106)
Creatinine, Ser: 1.12 mg/dL — ABNORMAL HIGH (ref 0.57–1.00)
Globulin, Total: 3.2 g/dL (ref 1.5–4.5)
Glucose: 89 mg/dL (ref 65–99)
Potassium: 4.4 mmol/L (ref 3.5–5.2)
Sodium: 143 mmol/L (ref 134–144)
Total Protein: 7.8 g/dL (ref 6.0–8.5)
eGFR: 50 mL/min/{1.73_m2} — ABNORMAL LOW (ref >59)

## 2021-03-24 LAB — URIC ACID: Uric Acid: 5.6 mg/dL (ref 3.1–7.9)

## 2021-03-25 ENCOUNTER — Ambulatory Visit (INDEPENDENT_AMBULATORY_CARE_PROVIDER_SITE_OTHER): Payer: Medicare HMO | Admitting: Nurse Practitioner

## 2021-03-25 ENCOUNTER — Encounter: Payer: Self-pay | Admitting: Nurse Practitioner

## 2021-03-25 ENCOUNTER — Other Ambulatory Visit: Payer: Self-pay

## 2021-03-25 ENCOUNTER — Encounter: Payer: Self-pay | Admitting: Orthopaedic Surgery

## 2021-03-25 ENCOUNTER — Ambulatory Visit: Payer: Medicare HMO | Admitting: Orthopaedic Surgery

## 2021-03-25 VITALS — BP 150/89 | HR 90 | Temp 97.5°F | Ht 62.0 in | Wt 211.0 lb

## 2021-03-25 DIAGNOSIS — N1831 Chronic kidney disease, stage 3a: Secondary | ICD-10-CM

## 2021-03-25 DIAGNOSIS — R002 Palpitations: Secondary | ICD-10-CM

## 2021-03-25 DIAGNOSIS — I10 Essential (primary) hypertension: Secondary | ICD-10-CM | POA: Diagnosis not present

## 2021-03-25 DIAGNOSIS — E785 Hyperlipidemia, unspecified: Secondary | ICD-10-CM

## 2021-03-25 DIAGNOSIS — M1711 Unilateral primary osteoarthritis, right knee: Secondary | ICD-10-CM

## 2021-03-25 DIAGNOSIS — M1A00X0 Idiopathic chronic gout, unspecified site, without tophus (tophi): Secondary | ICD-10-CM | POA: Diagnosis not present

## 2021-03-25 DIAGNOSIS — M25561 Pain in right knee: Secondary | ICD-10-CM

## 2021-03-25 DIAGNOSIS — G8929 Other chronic pain: Secondary | ICD-10-CM

## 2021-03-25 NOTE — Progress Notes (Signed)
Established Patient Office Visit  Subjective:  Patient ID: [name], female    DOB: October 04, 1940  Age: 80 y.o. MRN: [id number]  CC:  Chief Complaint  Patient presents with   Hypertension    Follow up    HPI [name] presents for lab follow-up. She has hx of HTN, HLD, CKD, and gout.   She states that she has been having palpitations. Last episode was early this morning. She states the episodes last about 15 minutes. No current palpitations or acute distress. She states that sometimes when she has palpitations it feels like she is going to pass out (2 episodes that severe).  Past Medical History:  Diagnosis Date   Acid reflux    Arthritis    right knee pain   Gout    Gout    Hypertension    Shortness of breath     Past Surgical History:  Procedure Laterality Date   MASS EXCISION N/A 04/11/2014   Procedure: EXCISION SOFT TISSUE NEOPLASM,  BACK;  Surgeon: Jamesetta So, MD;  Location: [facility];  Service: General;  Laterality: N/A;   TONSILLECTOMY     TUBAL LIGATION      Family History  Problem Relation Age of Onset   Cancer Mother    Diabetes Father     Social History   Socioeconomic History   Marital status: Widowed    Spouse name: Not on file   Number of children: Not on file   Years of education: Not on file   Highest education level: Not on file  Occupational History   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  Tobacco Use   Smoking status: Never   Smokeless tobacco: Never  Vaping Use   Vaping Use: Never used  Substance and Sexual Activity   Alcohol use: No   Drug use: No   Sexual activity: Yes    Birth control/protection: Post-menopausal  Other Topics Concern   Not on file  Social History Narrative   Not on file   Social Determinants of Health   Financial Resource Strain: Not on file  Food Insecurity: Not on file  Transportation Needs: Not on file  Physical Activity: Not on file   Stress: Not on file  Social Connections: Not on file  Intimate Partner Violence: Not on file    Outpatient Medications Prior to Visit  Medication Sig Dispense Refill   albuterol (VENTOLIN HFA) 108 (90 Base) MCG/ACT inhaler INHALE TWO PUFFS INTO LUNGS EVERY SIX HOURS AS NEEDED FOR WHEEZING OR SHORTNESS OF BREATH. 18 g 1   allopurinol (ZYLOPRIM) 100 MG tablet Take 2 tablets (200 mg total) by mouth daily. 180 tablet 2   amLODipine (NORVASC) 5 MG tablet Take 1 tablet (5 mg total) by mouth daily. 90 tablet 2   aspirin EC 81 MG tablet Take 81 mg by mouth daily.     atorvastatin (LIPITOR) 10 MG tablet Take 1 tablet (10 mg total) by mouth daily. 90 tablet 2   diclofenac sodium (VOLTAREN) 1 % GEL Apply 2 g topically 4 (four) times daily. Pain 100 g 3   fluticasone (FLONASE) 50 MCG/ACT nasal spray Place 2 sprays into both nostrils daily. 16 g 6   loratadine (CLARITIN) 10 MG tablet Take 1 tablet (10 mg total) by mouth daily as needed for allergies. 30 tablet 11   polyethylene glycol powder (GLYCOLAX/MIRALAX) 17 GM/SCOOP powder Take 17 g by mouth daily as needed.  3350 g 1   triamterene-hydrochlorothiazide (MAXZIDE-25) 37.5-25 MG tablet Take 1 tablet by mouth once daily 90 tablet 0   No facility-administered medications prior to visit.    No Known Allergies  ROS Review of Systems  Constitutional: Negative.   Respiratory: Negative.    Cardiovascular:  Positive for palpitations.       Fells like she is going to pass out sometimes (had 2 episodes) while she was having palpitations  Psychiatric/Behavioral: Negative.       Objective:    Physical Exam Constitutional:      Appearance: Normal appearance. She is obese.  Cardiovascular:     Rate and Rhythm: Normal rate and regular rhythm.     Pulses: Normal pulses.     Heart sounds: Normal heart sounds.  Pulmonary:     Effort: Pulmonary effort is normal.     Breath sounds: Normal breath sounds.  Neurological:     Mental Status: She is alert.   Psychiatric:        Mood and Affect: Mood normal.        Behavior: Behavior normal.        Thought Content: Thought content normal.        Judgment: Judgment normal.    BP (!) 150/89 (BP Location: Left Arm, Patient Position: Sitting, Cuff Size: Large)   Pulse 90   Temp (!) 97.5 F (36.4 C) (Temporal)   Ht '5\' 2"'  (1.575 m)   Wt 211 lb (95.7 kg)   SpO2 95%   BMI 38.59 kg/m  Wt Readings from Last 3 Encounters:  03/25/21 211 lb (95.7 kg)  11/19/20 212 lb (96.2 kg)  11/03/20 213 lb (96.6 kg)     Health Maintenance Due  Topic Date Due   Zoster Vaccines- Shingrix (1 of 2) Never done    There are no preventive care reminders to display for this patient.  Lab Results  Component Value Date   TSH 1.16 06/10/2019   Lab Results  Component Value Date   WBC 5.1 03/22/2021   HGB 13.3 03/22/2021   HCT 39.3 03/22/2021   MCV 99 (H) 03/22/2021   PLT 283 03/22/2021   Lab Results  Component Value Date   NA 143 03/22/2021   K 4.4 03/22/2021   CO2 23 03/22/2021   GLUCOSE 89 03/22/2021   BUN 27 03/22/2021   CREATININE 1.12 (H) 03/22/2021   BILITOT 0.6 03/22/2021   ALKPHOS 78 03/22/2021   AST 40 03/22/2021   ALT 14 03/22/2021   PROT 7.8 03/22/2021   ALBUMIN 4.6 03/22/2021   CALCIUM 10.3 03/22/2021   ANIONGAP 9 05/13/2019   EGFR 50 (L) 03/22/2021   Lab Results  Component Value Date   CHOL 175 03/22/2021   Lab Results  Component Value Date   HDL 73 03/22/2021   Lab Results  Component Value Date   LDLCALC 90 03/22/2021   Lab Results  Component Value Date   TRIG 62 03/22/2021   Lab Results  Component Value Date   CHOLHDL 2.1 10/30/2020   Lab Results  Component Value Date   HGBA1C 4.8 08/02/2016      Assessment & Plan:   Problem List Items Addressed This Visit       Cardiovascular and Mediastinum   Essential hypertension, benign - Primary    BP Readings from Last 3 Encounters:  03/25/21 (!) 150/89  11/19/20 (!) 147/85  11/03/20 (!) 175/101  -BP  elevated today -takes triameterene-HCTZ and amlodipine for BP -she states she  routinely forgets her amlodipine, and she forgot it last night -she will set a cell phone alarm and take her medication on time -f/u in 1 month for BP check      Relevant Orders   CBC with Differential/Platelet   CMP14+EGFR   Lipid Panel With LDL/HDL Ratio     Genitourinary   Chronic kidney disease (CKD) stage G3a/A1, moderately decreased glomerular filtration rate (GFR) between 45-59 mL/min/1.73 square meter and albuminuria creatinine ratio less than 30 mg/g (HCC)    Lab Results  Component Value Date   CREATININE 1.12 (H) 03/22/2021   BUN 27 03/22/2021   NA 143 03/22/2021   K 4.4 03/22/2021   CL 104 03/22/2021   CO2 23 03/22/2021  -stable from previous labs -likely related to HTN; control BP to improve or prevent decline in GFR      Relevant Orders   CMP14+EGFR     Other   Mild hyperlipidemia    Lab Results  Component Value Date   CHOL 175 03/22/2021   HDL 73 03/22/2021   LDLCALC 90 03/22/2021   TRIG 62 03/22/2021   CHOLHDL 2.1 10/30/2020  -LDL < 100, so she is at goal -continue atorvastatin       Relevant Orders   Lipid Panel With LDL/HDL Ratio   Gout    -uric acid 5.6 -goal < 6, so she is at goal -no changes to allopurinol       Palpitations    -had episode this AM prior to coming to the office; no acute distress today -will get EKG -referral to cardiology       Relevant Orders   Ambulatory referral to Cardiology    No orders of the defined types were placed in this encounter.   Follow-up: Return in about 1 month (around 04/25/2021) for BP check.    Noreene Larsson, NP

## 2021-03-25 NOTE — Assessment & Plan Note (Addendum)
-  had episode this AM prior to coming to the office; no acute distress today -EKG showed NSR -referral to cardiology

## 2021-03-25 NOTE — Progress Notes (Signed)
This patient is diagnosed with osteoarthritis of the knee(s).    Radiographs show evidence of joint space narrowing, osteophytes, subchondral sclerosis and/or subchondral cysts.  This patient has knee pain which interferes with functional and activities of daily living.    This patient has experienced inadequate response, adverse effects and/or intolerance with conservative treatments such as acetaminophen, NSAIDS, topical creams, physical therapy or regular exercise, knee bracing and/or weight loss.   This patient has experienced inadequate response or has a contraindication to intra articular steroid injections for at least 3 months.   This patient is not scheduled to have a total knee replacement within 6 months of starting treatment with viscosupplementation. PROCEDURE NOTE:  Synvisc injection #  3 of 3   Injection 1 vial of Synvisc into right  knee  There were no vitals taken for this visit.  The right knee exam: there was no synovitis or infection   The knee was prepped sterilely  Ethyl chloride was used to anesthetize the skin A 20 g needle was used to inject the knee with 1 vial of Synvisc A sterile dressing was placed  There were no complications  Follow up five weeks.  Call if any problem.  Precautions discussed.  Electronically Signed Sanjuana Kava, MD 7/28/20229:37 AM

## 2021-03-25 NOTE — Assessment & Plan Note (Signed)
-  uric acid 5.6 -goal < 6, so she is at goal -no changes to allopurinol

## 2021-03-25 NOTE — Assessment & Plan Note (Addendum)
BP Readings from Last 3 Encounters:  03/25/21 (!) 150/89  11/19/20 (!) 147/85  11/03/20 (!) 175/101   -BP elevated today -takes triameterene-HCTZ and amlodipine for BP -she states she routinely forgets her amlodipine, and she forgot it last night -she will set a cell phone alarm and take her medication on time -f/u in 1 month for BP check

## 2021-03-25 NOTE — Assessment & Plan Note (Signed)
Lab Results  Component Value Date   CHOL 175 03/22/2021   HDL 73 03/22/2021   LDLCALC 90 03/22/2021   TRIG 62 03/22/2021   CHOLHDL 2.1 10/30/2020  -LDL < 100, so she is at goal -continue atorvastatin

## 2021-03-25 NOTE — Patient Instructions (Signed)
We will meet up in 1 month for a BP check.  Since labs were good today, we will check them again in 6 months.

## 2021-03-25 NOTE — Assessment & Plan Note (Addendum)
Lab Results  Component Value Date   CREATININE 1.12 (H) 03/22/2021   BUN 27 03/22/2021   NA 143 03/22/2021   K 4.4 03/22/2021   CL 104 03/22/2021   CO2 23 03/22/2021   -stable from previous labs -likely related to HTN; control BP to improve or prevent decline in GFR

## 2021-03-30 LAB — TSH: TSH: 0.767 u[IU]/mL (ref 0.450–4.500)

## 2021-03-30 LAB — SPECIMEN STATUS REPORT

## 2021-04-13 ENCOUNTER — Telehealth: Payer: Self-pay | Admitting: *Deleted

## 2021-04-13 NOTE — Chronic Care Management (AMB) (Signed)
  Chronic Care Management   Outreach Note  04/13/2021 Name: [name] MRN: [id number] DOB: November 27, 1940  [name] is a 80 y.o. year old female who is a primary care patient of Noreene Larsson, NP. I reached out to [employer] by phone today in response to a referral sent by Ms. [name]'s PCP, Noreene Larsson, NP      An unsuccessful telephone outreach was attempted today. The patient was referred to the case management team for assistance with care management and care coordination.   Follow Up Plan: A HIPAA compliant phone message was left for the patient providing contact information and requesting a return call. The care management team will reach out to the patient again over the next 7 days.  If patient returns call to provider office, please advise to call [facility] at [phone].  [facility]  Direct Dial: [phone]

## 2021-04-19 NOTE — Chronic Care Management (AMB) (Signed)
  Chronic Care Management   Outreach Note  04/19/2021 Name: [name] MRN: [id number] DOB: Jan 18, 1941  [name] is a 80 y.o. year old female who is a primary care patient of Noreene Larsson, NP. I reached out to [employer] by phone today in response to a referral sent by [name]'s PCP, Noreene Larsson, NP      A second unsuccessful telephone outreach was attempted today. The patient was referred to the case management team for assistance with care management and care coordination.   Follow Up Plan: A HIPAA compliant phone message was left for the patient providing contact information and requesting a return call.  The care management team will reach out to the patient again over the next 7 days.  If patient returns call to provider office, please advise to call [facility]* at [phone].*  [facility]  Direct Dial: [phone]

## 2021-04-26 ENCOUNTER — Ambulatory Visit (INDEPENDENT_AMBULATORY_CARE_PROVIDER_SITE_OTHER): Payer: Medicare HMO | Admitting: Nurse Practitioner

## 2021-04-26 ENCOUNTER — Encounter: Payer: Self-pay | Admitting: Nurse Practitioner

## 2021-04-26 ENCOUNTER — Other Ambulatory Visit: Payer: Self-pay

## 2021-04-26 ENCOUNTER — Other Ambulatory Visit: Payer: Self-pay | Admitting: Nurse Practitioner

## 2021-04-26 VITALS — BP 152/87 | HR 80 | Temp 99.0°F | Ht 62.0 in | Wt 212.0 lb

## 2021-04-26 DIAGNOSIS — I10 Essential (primary) hypertension: Secondary | ICD-10-CM

## 2021-04-26 DIAGNOSIS — N6310 Unspecified lump in the right breast, unspecified quadrant: Secondary | ICD-10-CM

## 2021-04-26 MED ORDER — AMLODIPINE BESYLATE 10 MG PO TABS
10.0000 mg | ORAL_TABLET | Freq: Every day | ORAL | 1 refills | Status: AC
Start: 2021-04-26 — End: ?

## 2021-04-26 NOTE — Progress Notes (Addendum)
Established Patient Office Visit  Subjective:  Patient ID: [name], female    DOB: 08/12/1941  Age: 80 y.o. MRN: [id number]  CC:  Chief Complaint  Patient presents with   Hypertension    Follow up   breast complaint    R breast has a knot in it, noticed it x1 week ago, feels painful when touched.     HPI [name] presents for BP follow-up. At he last OV, her BP was 150/89.  She noticed a right breast mass last week. She states she has had some fatty tissue in her breast previously, but she states it feels larger and is painful today.  Past Medical History:  Diagnosis Date   Acid reflux    Arthritis    right knee pain   Gout    Gout    Hypertension    Shortness of breath     Past Surgical History:  Procedure Laterality Date   MASS EXCISION N/A 04/11/2014   Procedure: EXCISION SOFT TISSUE NEOPLASM,  BACK;  Surgeon: Jamesetta So, MD;  Location: [facility];  Service: General;  Laterality: N/A;   TONSILLECTOMY     TUBAL LIGATION      Family History  Problem Relation Age of Onset   Cancer Mother    Diabetes Father     Social History   Socioeconomic History   Marital status: Widowed    Spouse name: Not on file   Number of children: Not on file   Years of education: Not on file   Highest education level: Not on file  Occupational History   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  Tobacco Use   Smoking status: Never   Smokeless tobacco: Never  Vaping Use   Vaping Use: Never used  Substance and Sexual Activity   Alcohol use: No   Drug use: No   Sexual activity: Yes    Birth control/protection: Post-menopausal  Other Topics Concern   Not on file  Social History Narrative   Not on file   Social Determinants of Health   Financial Resource Strain: Not on file  Food Insecurity: Not on file  Transportation Needs: Not on file  Physical Activity: Not on file  Stress: Not on file  Social  Connections: Not on file  Intimate Partner Violence: Not on file    Outpatient Medications Prior to Visit  Medication Sig Dispense Refill   albuterol (VENTOLIN HFA) 108 (90 Base) MCG/ACT inhaler INHALE TWO PUFFS INTO LUNGS EVERY SIX HOURS AS NEEDED FOR WHEEZING OR SHORTNESS OF BREATH. 18 g 1   allopurinol (ZYLOPRIM) 100 MG tablet Take 2 tablets (200 mg total) by mouth daily. 180 tablet 2   aspirin EC 81 MG tablet Take 81 mg by mouth daily.     atorvastatin (LIPITOR) 10 MG tablet Take 1 tablet (10 mg total) by mouth daily. 90 tablet 2   diclofenac sodium (VOLTAREN) 1 % GEL Apply 2 g topically 4 (four) times daily. Pain 100 g 3   fluticasone (FLONASE) 50 MCG/ACT nasal spray Place 2 sprays into both nostrils daily. 16 g 6   loratadine (CLARITIN) 10 MG tablet Take 1 tablet (10 mg total) by mouth daily as needed for allergies. 30 tablet 11   polyethylene glycol powder (GLYCOLAX/MIRALAX) 17 GM/SCOOP powder Take 17 g by mouth daily as needed. 3350 g 1   triamterene-hydrochlorothiazide (MAXZIDE-25) 37.5-25 MG tablet Take 1 tablet by mouth once  daily 90 tablet 0   amLODipine (NORVASC) 5 MG tablet Take 1 tablet (5 mg total) by mouth daily. 90 tablet 2   No facility-administered medications prior to visit.    No Known Allergies  ROS Review of Systems  Constitutional: Negative.   Respiratory: Negative.    Cardiovascular: Negative.   Skin:        Right breast mass     Objective:    Physical Exam Exam conducted with a chaperone present [facility]).  Constitutional:      Appearance: Normal appearance.  Cardiovascular:     Rate and Rhythm: Normal rate and regular rhythm.     Pulses: Normal pulses.     Heart sounds: Normal heart sounds.  Pulmonary:     Effort: Pulmonary effort is normal.     Breath sounds: Normal breath sounds.  Chest:     Chest wall: Mass present.  Breasts:    Right: Mass present. No swelling, bleeding, inverted nipple, nipple discharge, skin change or tenderness.      Left: Normal.    Lymphadenopathy:     Upper Body:     Right upper body: No supraclavicular, axillary or pectoral adenopathy.     Left upper body: No supraclavicular, axillary or pectoral adenopathy.  Neurological:     Mental Status: She is alert.    BP (!) 152/87 (BP Location: Left Arm, Patient Position: Sitting, Cuff Size: Large)   Pulse 80   Temp 99 F (37.2 C) (Oral)   Ht '5\' 2"'  (1.575 m)   Wt 212 lb (96.2 kg)   SpO2 96%   BMI 38.78 kg/m  Wt Readings from Last 3 Encounters:  04/26/21 212 lb (96.2 kg)  03/25/21 211 lb (95.7 kg)  11/19/20 212 lb (96.2 kg)     Health Maintenance Due  Topic Date Due   Zoster Vaccines- Shingrix (1 of 2) Never done   INFLUENZA VACCINE  03/29/2021    There are no preventive care reminders to display for this patient.  Lab Results  Component Value Date   TSH 0.767 03/22/2021   Lab Results  Component Value Date   WBC 5.1 03/22/2021   HGB 13.3 03/22/2021   HCT 39.3 03/22/2021   MCV 99 (H) 03/22/2021   PLT 283 03/22/2021   Lab Results  Component Value Date   NA 143 03/22/2021   K 4.4 03/22/2021   CO2 23 03/22/2021   GLUCOSE 89 03/22/2021   BUN 27 03/22/2021   CREATININE 1.12 (H) 03/22/2021   BILITOT 0.6 03/22/2021   ALKPHOS 78 03/22/2021   AST 40 03/22/2021   ALT 14 03/22/2021   PROT 7.8 03/22/2021   ALBUMIN 4.6 03/22/2021   CALCIUM 10.3 03/22/2021   ANIONGAP 9 05/13/2019   EGFR 50 (L) 03/22/2021   Lab Results  Component Value Date   CHOL 175 03/22/2021   Lab Results  Component Value Date   HDL 73 03/22/2021   Lab Results  Component Value Date   LDLCALC 90 03/22/2021   Lab Results  Component Value Date   TRIG 62 03/22/2021   Lab Results  Component Value Date   CHOLHDL 2.1 10/30/2020   Lab Results  Component Value Date   HGBA1C 4.8 08/02/2016      Assessment & Plan:   Problem List Items Addressed This Visit       Cardiovascular and Mediastinum   Essential hypertension, benign - Primary     -INCREASE amlodipine to 10 mg -continue maxzide-25  Relevant Medications   amLODipine (NORVASC) 10 MG tablet     Other   Breast mass, right    -ordered mammo and U/S -small mass in right breast upper outer quadrant; size of pencil eraser      Relevant Orders   MM Digital Diagnostic Bilat   US BREAST COMPLETE UNI RIGHT INC AXILLA    Meds ordered this encounter  Medications   amLODipine (NORVASC) 10 MG tablet    Sig: Take 1 tablet (10 mg total) by mouth daily.    Dispense:  90 tablet    Refill:  1    Follow-up: Return in about 1 month (around 05/27/2021) for Med check.    Noreene Larsson, NP

## 2021-04-26 NOTE — Assessment & Plan Note (Signed)
-  ordered mammo and U/S -small mass in right breast upper outer quadrant; size of pencil eraser

## 2021-04-26 NOTE — Assessment & Plan Note (Signed)
-  INCREASE amlodipine to 10 mg -continue maxzide-25

## 2021-04-28 NOTE — Chronic Care Management (AMB) (Signed)
  Chronic Care Management   Outreach Note  04/28/2021 Name: [name] MRN: [id number] DOB: 28-Jul-1941  [name] is a 80 y.o. year old female who is a primary care patient of Noreene Larsson, NP. I reached out to [employer] by phone today in response to a referral sent by Ms. [name]'s PCP, Noreene Larsson, NP      Third unsuccessful telephone outreach was attempted today. The patient was referred to the case management team for assistance with care management and care coordination. The patient's primary care provider has been notified of our unsuccessful attempts to make or maintain contact with the patient. The care management team is pleased to engage with this patient at any time in the future should he/she be interested in assistance from the care management team.   Follow Up Plan: We have been unable to make contact with the patient. The care management team is available to follow up with the patient after provider conversation with the patient regarding recommendation for care management engagement and subsequent re-referral to the care management team. A HIPAA compliant phone message was left for the patient providing contact information and requesting a return call. If patient returns call to provider office, please advise to call [facility] at [phone].  [facility] Management  Direct Dial: [phone]

## 2021-04-29 ENCOUNTER — Other Ambulatory Visit: Payer: Self-pay

## 2021-04-29 ENCOUNTER — Encounter: Payer: Self-pay | Admitting: Orthopaedic Surgery

## 2021-04-29 ENCOUNTER — Ambulatory Visit (INDEPENDENT_AMBULATORY_CARE_PROVIDER_SITE_OTHER): Payer: Medicare HMO | Admitting: Orthopaedic Surgery

## 2021-04-29 VITALS — BP 158/92 | HR 85 | Ht 62.0 in | Wt 211.4 lb

## 2021-04-29 DIAGNOSIS — G8929 Other chronic pain: Secondary | ICD-10-CM | POA: Diagnosis not present

## 2021-04-29 DIAGNOSIS — M25561 Pain in right knee: Secondary | ICD-10-CM | POA: Diagnosis not present

## 2021-04-29 NOTE — Progress Notes (Signed)
My knee is much better.  She has done well with the right knee after the viscosupplementation.  She is walking better and has much less pain.  ROM of her right knee is 0 to 110, no limp, some crepitus and some effusion, knee is stable.  No limp.  Encounter Diagnosis  Name Primary?   Chronic pain of right knee Yes   I will see as needed.  Call if any problem.  Precautions discussed.  Electronically Signed Sanjuana Kava, MD 9/1/20229:26 AM

## 2021-05-27 ENCOUNTER — Encounter: Payer: Self-pay | Admitting: Nurse Practitioner

## 2021-05-27 ENCOUNTER — Other Ambulatory Visit: Payer: Self-pay

## 2021-05-27 ENCOUNTER — Ambulatory Visit (INDEPENDENT_AMBULATORY_CARE_PROVIDER_SITE_OTHER): Payer: Medicare HMO | Admitting: Nurse Practitioner

## 2021-05-27 VITALS — BP 147/86 | HR 99 | Temp 97.9°F | Ht 62.0 in | Wt 212.0 lb

## 2021-05-27 DIAGNOSIS — E785 Hyperlipidemia, unspecified: Secondary | ICD-10-CM

## 2021-05-27 DIAGNOSIS — Z23 Encounter for immunization: Secondary | ICD-10-CM | POA: Diagnosis not present

## 2021-05-27 DIAGNOSIS — I10 Essential (primary) hypertension: Secondary | ICD-10-CM

## 2021-05-27 NOTE — Patient Instructions (Signed)
Please have fasting labs drawn 2-3 days prior to your appointment so we can discuss the results during your office visit.  

## 2021-05-27 NOTE — Assessment & Plan Note (Signed)
BP Readings from Last 3 Encounters:  05/27/21 (!) 147/86  04/29/21 (!) 158/92  04/26/21 (!) 152/87   -BP improved some from previous check -she home BPs have been great -no change in meds today

## 2021-05-27 NOTE — Progress Notes (Signed)
Acute Office Visit  Subjective:    Patient ID: [name], female    DOB: [birth year], 80 y.o.   MRN: [id number]  Chief Complaint  Patient presents with   Follow-up    1 month follow up    HPI Patient is in today for BP check. At her last OV, we increased her amlodipine to 10 mg from 5. Her home SBPs have been in the 110-120s.  She has upcoming [place] and mammogram in October.  Past Medical History:  Diagnosis Date   Acid reflux    Arthritis    right knee pain   Gout    Gout    Hypertension    Shortness of breath     Past Surgical History:  Procedure Laterality Date   MASS EXCISION N/A 04/11/2014   Procedure: EXCISION SOFT TISSUE NEOPLASM,  BACK;  Surgeon: Jamesetta So, MD;  Location: [facility];  Service: General;  Laterality: N/A;   TONSILLECTOMY     TUBAL LIGATION      Family History  Problem Relation Age of Onset   Cancer Mother    Diabetes Father     Social History   Socioeconomic History   Marital status: Widowed    Spouse name: Not on file   Number of children: Not on file   Years of education: Not on file   Highest education level: Not on file  Occupational History   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  Tobacco Use   Smoking status: Never   Smokeless tobacco: Never  Vaping Use   Vaping Use: Never used  Substance and Sexual Activity   Alcohol use: No   Drug use: No   Sexual activity: Yes    Birth control/protection: Post-menopausal  Other Topics Concern   Not on file  Social History Narrative   Not on file   Social Determinants of Health   Financial Resource Strain: Not on file  Food Insecurity: Not on file  Transportation Needs: Not on file  Physical Activity: Not on file  Stress: Not on file  Social Connections: Not on file  Intimate Partner Violence: Not on file    Outpatient Medications Prior to Visit  Medication Sig Dispense Refill   albuterol (VENTOLIN HFA) 108 (90 Base)  MCG/ACT inhaler INHALE TWO PUFFS INTO LUNGS EVERY SIX HOURS AS NEEDED FOR WHEEZING OR SHORTNESS OF BREATH. 18 g 1   allopurinol (ZYLOPRIM) 100 MG tablet Take 2 tablets (200 mg total) by mouth daily. 180 tablet 2   amLODipine (NORVASC) 10 MG tablet Take 1 tablet (10 mg total) by mouth daily. 90 tablet 1   aspirin EC 81 MG tablet Take 81 mg by mouth daily.     atorvastatin (LIPITOR) 10 MG tablet Take 1 tablet (10 mg total) by mouth daily. 90 tablet 2   diclofenac sodium (VOLTAREN) 1 % GEL Apply 2 g topically 4 (four) times daily. Pain 100 g 3   fluticasone (FLONASE) 50 MCG/ACT nasal spray Place 2 sprays into both nostrils daily. 16 g 6   loratadine (CLARITIN) 10 MG tablet Take 1 tablet (10 mg total) by mouth daily as needed for allergies. 30 tablet 11   polyethylene glycol powder (GLYCOLAX/MIRALAX) 17 GM/SCOOP powder Take 17 g by mouth daily as needed. 3350 g 1   triamterene-hydrochlorothiazide (MAXZIDE-25) 37.5-25 MG tablet Take 1 tablet by mouth once daily 90 tablet 0   No facility-administered medications prior to visit.  No Known Allergies  Review of Systems  Constitutional: Negative.   Respiratory: Negative.    Cardiovascular: Negative.   Musculoskeletal: Negative.   Psychiatric/Behavioral: Negative.        Objective:    Physical Exam Constitutional:      Appearance: Normal appearance.  Cardiovascular:     Rate and Rhythm: Normal rate and regular rhythm.     Pulses: Normal pulses.     Heart sounds: Normal heart sounds.  Pulmonary:     Effort: Pulmonary effort is normal.     Breath sounds: Normal breath sounds.  Musculoskeletal:        General: Normal range of motion.  Neurological:     Mental Status: She is alert.  Psychiatric:        Mood and Affect: Mood normal.        Behavior: Behavior normal.        Thought Content: Thought content normal.        Judgment: Judgment normal.    BP (!) 147/86 (BP Location: Right Arm, Patient Position: Sitting, Cuff Size: Large)    Pulse 99   Temp 97.9 F (36.6 C) (Oral)   Ht _0  (1.575 m)   Wt 212 lb 0.6 oz (96.2 kg)   SpO2 95%   BMI 38.78 kg/m  Wt Readings from Last 3 Encounters:  05/27/21 212 lb 0.6 oz (96.2 kg)  04/29/21 211 lb 6.4 oz (95.9 kg)  04/26/21 212 lb (96.2 kg)    Health Maintenance Due  Topic Date Due   Zoster Vaccines- Shingrix (1 of 2) Never done   COVID-19 Vaccine (3 - Booster for Pfizer series) 04/13/2020    There are no preventive care reminders to display for this patient.   Lab Results  Component Value Date   TSH 0.767 03/22/2021   Lab Results  Component Value Date   WBC 5.1 03/22/2021   HGB 13.3 03/22/2021   HCT 39.3 03/22/2021   MCV 99 (H) 03/22/2021   PLT 283 03/22/2021   Lab Results  Component Value Date   NA 143 03/22/2021   K 4.4 03/22/2021   CO2 23 03/22/2021   GLUCOSE 89 03/22/2021   BUN 27 03/22/2021   CREATININE 1.12 (H) 03/22/2021   BILITOT 0.6 03/22/2021   ALKPHOS 78 03/22/2021   AST 40 03/22/2021   ALT 14 03/22/2021   PROT 7.8 03/22/2021   ALBUMIN 4.6 03/22/2021   CALCIUM 10.3 03/22/2021   ANIONGAP 9 05/13/2019   EGFR 50 (L) 03/22/2021   Lab Results  Component Value Date   CHOL 175 03/22/2021   Lab Results  Component Value Date   HDL 73 03/22/2021   Lab Results  Component Value Date   LDLCALC 90 03/22/2021   Lab Results  Component Value Date   TRIG 62 03/22/2021   Lab Results  Component Value Date   CHOLHDL 2.1 10/30/2020   Lab Results  Component Value Date   HGBA1C 4.8 08/02/2016       Assessment & Plan:   Problem List Items Addressed This Visit       Cardiovascular and Mediastinum   Essential hypertension, benign - Primary    BP Readings from Last 3 Encounters:  05/27/21 (!) 147/86  04/29/21 (!) 158/92  04/26/21 (!) 152/87  -BP improved some from previous check -she home BPs have been great -no change in meds today      Relevant Orders   CBC with Differential/Platelet   CMP14+EGFR   Lipid Panel With  LDL/HDL  Ratio     Other   Mild hyperlipidemia    -check lipids with next set of labs      Relevant Orders   CBC with Differential/Platelet   CMP14+EGFR   Lipid Panel With LDL/HDL Ratio   Other Visit Diagnoses     Need for immunization against influenza       Relevant Orders   Flu Vaccine QUAD High Dose(Fluad) (Completed)   Immunization due            No orders of the defined types were placed in this encounter.    Noreene Larsson, NP

## 2021-05-27 NOTE — Assessment & Plan Note (Signed)
-  check lipids with next set of labs 

## 2021-06-08 ENCOUNTER — Ambulatory Visit (HOSPITAL_COMMUNITY)
Admission: RE | Admit: 2021-06-08 | Discharge: 2021-06-08 | Disposition: A | Discharge status: Home / Self Care | Payer: Medicare HMO | Source: Ambulatory Visit | Attending: Nurse Practitioner | Admitting: Nurse Practitioner

## 2021-06-08 ENCOUNTER — Other Ambulatory Visit: Payer: Self-pay

## 2021-06-08 ENCOUNTER — Encounter (HOSPITAL_COMMUNITY): Payer: Self-pay

## 2021-06-08 DIAGNOSIS — N6310 Unspecified lump in the right breast, unspecified quadrant: Secondary | ICD-10-CM

## 2021-06-08 DIAGNOSIS — R922 Inconclusive mammogram: Secondary | ICD-10-CM | POA: Diagnosis not present

## 2021-06-08 DIAGNOSIS — N644 Mastodynia: Secondary | ICD-10-CM | POA: Diagnosis not present

## 2021-06-08 DIAGNOSIS — N6311 Unspecified lump in the right breast, upper outer quadrant: Secondary | ICD-10-CM | POA: Diagnosis not present

## 2021-06-15 ENCOUNTER — Encounter: Payer: Self-pay | Admitting: Cardiology

## 2021-06-15 ENCOUNTER — Ambulatory Visit: Payer: Medicare HMO | Admitting: Cardiology

## 2021-06-15 VITALS — BP 130/82 | HR 89 | Ht 62.0 in | Wt 212.2 lb

## 2021-06-15 DIAGNOSIS — R002 Palpitations: Secondary | ICD-10-CM

## 2021-06-15 NOTE — Progress Notes (Signed)
Clinical Summary [name] is a 80 y.o.female seen as a new consult, referred by [name] for the following medical problems.  Palpitations - started a few months ago - fluttering feeling, typically occurs while in bed. Lasts 5-10 minutes. - varies in frequency, no recent episodes - no coffee. Drinks tea 1 glass twice a day. One soda a day mini can pepsi, no EtoH       Past Medical History:  Diagnosis Date   Acid reflux    Arthritis    right knee pain   Gout    Gout    Hypertension    Shortness of breath      No Known Allergies   Current Outpatient Medications  Medication Sig Dispense Refill   albuterol (VENTOLIN HFA) 108 (90 Base) MCG/ACT inhaler INHALE TWO PUFFS INTO LUNGS EVERY SIX HOURS AS NEEDED FOR WHEEZING OR SHORTNESS OF BREATH. 18 g 1   allopurinol (ZYLOPRIM) 100 MG tablet Take 2 tablets (200 mg total) by mouth daily. 180 tablet 2   amLODipine (NORVASC) 10 MG tablet Take 1 tablet (10 mg total) by mouth daily. 90 tablet 1   aspirin EC 81 MG tablet Take 81 mg by mouth daily.     atorvastatin (LIPITOR) 10 MG tablet Take 1 tablet (10 mg total) by mouth daily. 90 tablet 2   diclofenac sodium (VOLTAREN) 1 % GEL Apply 2 g topically 4 (four) times daily. Pain 100 g 3   fluticasone (FLONASE) 50 MCG/ACT nasal spray Place 2 sprays into both nostrils daily. 16 g 6   loratadine (CLARITIN) 10 MG tablet Take 1 tablet (10 mg total) by mouth daily as needed for allergies. 30 tablet 11   polyethylene glycol powder (GLYCOLAX/MIRALAX) 17 GM/SCOOP powder Take 17 g by mouth daily as needed. 3350 g 1   triamterene-hydrochlorothiazide (MAXZIDE-25) 37.5-25 MG tablet Take 1 tablet by mouth once daily 90 tablet 0   No current facility-administered medications for this visit.     Past Surgical History:  Procedure Laterality Date   MASS EXCISION N/A 04/11/2014   Procedure: EXCISION SOFT TISSUE NEOPLASM,  BACK;  Surgeon: Jamesetta So, MD;  Location: [facility];  Service: General;   Laterality: N/A;   TONSILLECTOMY     TUBAL LIGATION       No Known Allergies    Family History  Problem Relation Age of Onset   Cancer Mother    Diabetes Father      Social History [name] reports that she has never smoked. She has never used smokeless tobacco. [name] reports no history of alcohol use.   Review of Systems CONSTITUTIONAL: No weight loss, fever, chills, weakness or fatigue.  HEENT: Eyes: No visual loss, blurred vision, double vision or yellow sclerae.No hearing loss, sneezing, congestion, runny nose or sore throat.  SKIN: No rash or itching.  CARDIOVASCULAR: per hpi RESPIRATORY: No shortness of breath, cough or sputum.  GASTROINTESTINAL: No anorexia, nausea, vomiting or diarrhea. No abdominal pain or blood.  GENITOURINARY: No burning on urination, no polyuria NEUROLOGICAL: No headache, dizziness, syncope, paralysis, ataxia, numbness or tingling in the extremities. No change in bowel or bladder control.  MUSCULOSKELETAL: No muscle, back pain, joint pain or stiffness.  LYMPHATICS: No enlarged nodes. No history of splenectomy.  PSYCHIATRIC: No history of depression or anxiety.  ENDOCRINOLOGIC: No reports of sweating, cold or heat intolerance. No polyuria or polydipsia.  Marland Kitchen   Physical Examination Today's Vitals   06/15/21 1058  BP: 130/82  Pulse: 89  SpO2: 98%  Weight: 212 lb 3.2 oz (96.3 kg)  Height: 5\' 2"  (1.575 m)   Body mass index is 38.81 kg/m.  Gen: resting comfortably, no acute distress HEENT: no scleral icterus, pupils equal round and reactive, no palptable cervical adenopathy,  CV: RRR, no mrg, no jvd Resp: Clear to auscultation bilaterally GI: abdomen is soft, non-tender, non-distended, normal bowel sounds, no hepatosplenomegaly MSK: extremities are warm, no edema.  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 Asked to call us if recurrent palpitations to arrange an outpatient monitor       Arnoldo Lenis, M.D

## 2021-06-15 NOTE — Patient Instructions (Addendum)
Medication Instructions:  Continue all current medications.  Labwork: none  Testing/Procedures: none  Follow-Up: As needed - please contact us if palpitations reoccur.     Any Other Special Instructions Will Be Listed Below (If Applicable).   If you need a refill on your cardiac medications before your next appointment, please call your pharmacy.

## 2021-08-06 DIAGNOSIS — I10 Essential (primary) hypertension: Secondary | ICD-10-CM | POA: Diagnosis not present

## 2021-08-06 DIAGNOSIS — Z299 Encounter for prophylactic measures, unspecified: Secondary | ICD-10-CM | POA: Diagnosis not present

## 2021-08-06 DIAGNOSIS — J45901 Unspecified asthma with (acute) exacerbation: Secondary | ICD-10-CM | POA: Diagnosis not present

## 2021-08-06 DIAGNOSIS — E7800 Pure hypercholesterolemia, unspecified: Secondary | ICD-10-CM | POA: Diagnosis not present

## 2021-08-06 DIAGNOSIS — M109 Gout, unspecified: Secondary | ICD-10-CM | POA: Diagnosis not present

## 2021-09-15 DIAGNOSIS — I10 Essential (primary) hypertension: Secondary | ICD-10-CM | POA: Diagnosis not present

## 2021-09-15 DIAGNOSIS — Z7189 Other specified counseling: Secondary | ICD-10-CM | POA: Diagnosis not present

## 2021-09-15 DIAGNOSIS — E7800 Pure hypercholesterolemia, unspecified: Secondary | ICD-10-CM | POA: Diagnosis not present

## 2021-09-15 DIAGNOSIS — Z6839 Body mass index (BMI) 39.0-39.9, adult: Secondary | ICD-10-CM | POA: Diagnosis not present

## 2021-09-15 DIAGNOSIS — Z299 Encounter for prophylactic measures, unspecified: Secondary | ICD-10-CM | POA: Diagnosis not present

## 2021-09-15 DIAGNOSIS — Z1339 Encounter for screening examination for other mental health and behavioral disorders: Secondary | ICD-10-CM | POA: Diagnosis not present

## 2021-09-15 DIAGNOSIS — R059 Cough, unspecified: Secondary | ICD-10-CM | POA: Diagnosis not present

## 2021-09-15 DIAGNOSIS — Z0000 Encounter for general adult medical examination without abnormal findings: Secondary | ICD-10-CM | POA: Diagnosis not present

## 2021-09-15 DIAGNOSIS — Z789 Other specified health status: Secondary | ICD-10-CM | POA: Diagnosis not present

## 2021-09-15 DIAGNOSIS — Z1331 Encounter for screening for depression: Secondary | ICD-10-CM | POA: Diagnosis not present

## 2021-09-15 DIAGNOSIS — Z79899 Other long term (current) drug therapy: Secondary | ICD-10-CM | POA: Diagnosis not present

## 2021-09-24 DIAGNOSIS — E2839 Other primary ovarian failure: Secondary | ICD-10-CM | POA: Diagnosis not present

## 2021-09-27 ENCOUNTER — Ambulatory Visit: Payer: Medicare HMO | Admitting: Nurse Practitioner

## 2021-11-10 ENCOUNTER — Other Ambulatory Visit: Payer: Self-pay | Admitting: Internal Medicine

## 2021-11-10 ENCOUNTER — Ambulatory Visit
Admission: RE | Admit: 2021-11-10 | Discharge: 2021-11-10 | Disposition: A | Discharge status: Home / Self Care | Payer: Medicare HMO | Source: Ambulatory Visit | Attending: Internal Medicine | Admitting: Internal Medicine

## 2021-11-10 ENCOUNTER — Other Ambulatory Visit: Payer: Self-pay

## 2021-11-10 DIAGNOSIS — Z1231 Encounter for screening mammogram for malignant neoplasm of breast: Secondary | ICD-10-CM

## 2021-11-25 DIAGNOSIS — Z809 Family history of malignant neoplasm, unspecified: Secondary | ICD-10-CM | POA: Diagnosis not present

## 2021-11-25 DIAGNOSIS — E785 Hyperlipidemia, unspecified: Secondary | ICD-10-CM | POA: Diagnosis not present

## 2021-11-25 DIAGNOSIS — M8580 Other specified disorders of bone density and structure, unspecified site: Secondary | ICD-10-CM | POA: Diagnosis not present

## 2021-11-25 DIAGNOSIS — G8929 Other chronic pain: Secondary | ICD-10-CM | POA: Diagnosis not present

## 2021-11-25 DIAGNOSIS — Z833 Family history of diabetes mellitus: Secondary | ICD-10-CM | POA: Diagnosis not present

## 2021-11-25 DIAGNOSIS — Z008 Encounter for other general examination: Secondary | ICD-10-CM | POA: Diagnosis not present

## 2021-11-25 DIAGNOSIS — K219 Gastro-esophageal reflux disease without esophagitis: Secondary | ICD-10-CM | POA: Diagnosis not present

## 2021-11-25 DIAGNOSIS — Z6839 Body mass index (BMI) 39.0-39.9, adult: Secondary | ICD-10-CM | POA: Diagnosis not present

## 2021-11-25 DIAGNOSIS — Z825 Family history of asthma and other chronic lower respiratory diseases: Secondary | ICD-10-CM | POA: Diagnosis not present

## 2021-11-25 DIAGNOSIS — M109 Gout, unspecified: Secondary | ICD-10-CM | POA: Diagnosis not present

## 2021-11-25 DIAGNOSIS — Z8249 Family history of ischemic heart disease and other diseases of the circulatory system: Secondary | ICD-10-CM | POA: Diagnosis not present

## 2021-11-25 DIAGNOSIS — I10 Essential (primary) hypertension: Secondary | ICD-10-CM | POA: Diagnosis not present

## 2022-03-15 DIAGNOSIS — Z299 Encounter for prophylactic measures, unspecified: Secondary | ICD-10-CM | POA: Diagnosis not present

## 2022-03-15 DIAGNOSIS — J329 Chronic sinusitis, unspecified: Secondary | ICD-10-CM | POA: Diagnosis not present

## 2022-03-15 DIAGNOSIS — I10 Essential (primary) hypertension: Secondary | ICD-10-CM | POA: Diagnosis not present

## 2022-03-15 DIAGNOSIS — N181 Chronic kidney disease, stage 1: Secondary | ICD-10-CM | POA: Diagnosis not present

## 2022-03-15 DIAGNOSIS — Z789 Other specified health status: Secondary | ICD-10-CM | POA: Diagnosis not present

## 2022-03-15 DIAGNOSIS — Z6838 Body mass index (BMI) 38.0-38.9, adult: Secondary | ICD-10-CM | POA: Diagnosis not present

## 2022-05-13 DIAGNOSIS — B9789 Other viral agents as the cause of diseases classified elsewhere: Secondary | ICD-10-CM | POA: Diagnosis not present

## 2022-05-13 DIAGNOSIS — Z299 Encounter for prophylactic measures, unspecified: Secondary | ICD-10-CM | POA: Diagnosis not present

## 2022-05-13 DIAGNOSIS — I10 Essential (primary) hypertension: Secondary | ICD-10-CM | POA: Diagnosis not present

## 2022-05-13 DIAGNOSIS — J069 Acute upper respiratory infection, unspecified: Secondary | ICD-10-CM | POA: Diagnosis not present

## 2022-05-13 DIAGNOSIS — Z789 Other specified health status: Secondary | ICD-10-CM | POA: Diagnosis not present

## 2022-05-13 DIAGNOSIS — J028 Acute pharyngitis due to other specified organisms: Secondary | ICD-10-CM | POA: Diagnosis not present

## 2022-05-13 DIAGNOSIS — Z6838 Body mass index (BMI) 38.0-38.9, adult: Secondary | ICD-10-CM | POA: Diagnosis not present

## 2022-06-15 DIAGNOSIS — J069 Acute upper respiratory infection, unspecified: Secondary | ICD-10-CM | POA: Diagnosis not present

## 2022-06-15 DIAGNOSIS — I10 Essential (primary) hypertension: Secondary | ICD-10-CM | POA: Diagnosis not present

## 2022-06-15 DIAGNOSIS — Z299 Encounter for prophylactic measures, unspecified: Secondary | ICD-10-CM | POA: Diagnosis not present

## 2022-06-15 DIAGNOSIS — Z789 Other specified health status: Secondary | ICD-10-CM | POA: Diagnosis not present

## 2022-06-15 DIAGNOSIS — Z6838 Body mass index (BMI) 38.0-38.9, adult: Secondary | ICD-10-CM | POA: Diagnosis not present

## 2022-06-23 ENCOUNTER — Ambulatory Visit
Admission: RE | Admit: 2022-06-23 | Discharge: 2022-06-23 | Disposition: A | Discharge status: Home / Self Care | Payer: Medicare HMO | Source: Ambulatory Visit | Attending: Family Medicine | Admitting: Family Medicine

## 2022-06-23 DIAGNOSIS — Z1231 Encounter for screening mammogram for malignant neoplasm of breast: Secondary | ICD-10-CM

## 2022-08-19 DIAGNOSIS — J069 Acute upper respiratory infection, unspecified: Secondary | ICD-10-CM | POA: Diagnosis not present

## 2022-08-19 DIAGNOSIS — R0981 Nasal congestion: Secondary | ICD-10-CM | POA: Diagnosis not present

## 2022-09-16 DIAGNOSIS — Z0000 Encounter for general adult medical examination without abnormal findings: Secondary | ICD-10-CM | POA: Diagnosis not present

## 2022-09-16 DIAGNOSIS — Z6838 Body mass index (BMI) 38.0-38.9, adult: Secondary | ICD-10-CM | POA: Diagnosis not present

## 2022-09-16 DIAGNOSIS — R5383 Other fatigue: Secondary | ICD-10-CM | POA: Diagnosis not present

## 2022-09-16 DIAGNOSIS — E7800 Pure hypercholesterolemia, unspecified: Secondary | ICD-10-CM | POA: Diagnosis not present

## 2022-09-16 DIAGNOSIS — J449 Chronic obstructive pulmonary disease, unspecified: Secondary | ICD-10-CM | POA: Diagnosis not present

## 2022-09-16 DIAGNOSIS — Z79899 Other long term (current) drug therapy: Secondary | ICD-10-CM | POA: Diagnosis not present

## 2022-09-16 DIAGNOSIS — Z1339 Encounter for screening examination for other mental health and behavioral disorders: Secondary | ICD-10-CM | POA: Diagnosis not present

## 2022-09-16 DIAGNOSIS — Z1331 Encounter for screening for depression: Secondary | ICD-10-CM | POA: Diagnosis not present

## 2022-09-16 DIAGNOSIS — I10 Essential (primary) hypertension: Secondary | ICD-10-CM | POA: Diagnosis not present

## 2022-09-16 DIAGNOSIS — Z299 Encounter for prophylactic measures, unspecified: Secondary | ICD-10-CM | POA: Diagnosis not present

## 2022-09-16 DIAGNOSIS — Z7189 Other specified counseling: Secondary | ICD-10-CM | POA: Diagnosis not present

## 2022-09-19 DIAGNOSIS — Z299 Encounter for prophylactic measures, unspecified: Secondary | ICD-10-CM | POA: Diagnosis not present

## 2022-09-19 DIAGNOSIS — I10 Essential (primary) hypertension: Secondary | ICD-10-CM | POA: Diagnosis not present

## 2022-09-19 DIAGNOSIS — M1711 Unilateral primary osteoarthritis, right knee: Secondary | ICD-10-CM | POA: Diagnosis not present

## 2022-10-27 ENCOUNTER — Encounter: Payer: Self-pay | Admitting: Radiology

## 2022-11-18 DIAGNOSIS — Z0000 Encounter for general adult medical examination without abnormal findings: Secondary | ICD-10-CM | POA: Diagnosis not present

## 2022-11-18 DIAGNOSIS — M1711 Unilateral primary osteoarthritis, right knee: Secondary | ICD-10-CM | POA: Diagnosis not present

## 2022-11-18 DIAGNOSIS — M25861 Other specified joint disorders, right knee: Secondary | ICD-10-CM | POA: Diagnosis not present

## 2022-11-18 DIAGNOSIS — R52 Pain, unspecified: Secondary | ICD-10-CM | POA: Diagnosis not present

## 2022-11-18 DIAGNOSIS — M25561 Pain in right knee: Secondary | ICD-10-CM | POA: Diagnosis not present

## 2022-11-18 DIAGNOSIS — Z299 Encounter for prophylactic measures, unspecified: Secondary | ICD-10-CM | POA: Diagnosis not present

## 2022-11-18 DIAGNOSIS — I10 Essential (primary) hypertension: Secondary | ICD-10-CM | POA: Diagnosis not present

## 2023-01-02 DIAGNOSIS — J4489 Other specified chronic obstructive pulmonary disease: Secondary | ICD-10-CM | POA: Diagnosis not present

## 2023-01-02 DIAGNOSIS — E785 Hyperlipidemia, unspecified: Secondary | ICD-10-CM | POA: Diagnosis not present

## 2023-01-02 DIAGNOSIS — Z6841 Body Mass Index (BMI) 40.0 and over, adult: Secondary | ICD-10-CM | POA: Diagnosis not present

## 2023-01-02 DIAGNOSIS — Z791 Long term (current) use of non-steroidal anti-inflammatories (NSAID): Secondary | ICD-10-CM | POA: Diagnosis not present

## 2023-01-02 DIAGNOSIS — Z809 Family history of malignant neoplasm, unspecified: Secondary | ICD-10-CM | POA: Diagnosis not present

## 2023-01-02 DIAGNOSIS — N1831 Chronic kidney disease, stage 3a: Secondary | ICD-10-CM | POA: Diagnosis not present

## 2023-01-02 DIAGNOSIS — Z825 Family history of asthma and other chronic lower respiratory diseases: Secondary | ICD-10-CM | POA: Diagnosis not present

## 2023-01-02 DIAGNOSIS — M1990 Unspecified osteoarthritis, unspecified site: Secondary | ICD-10-CM | POA: Diagnosis not present

## 2023-01-02 DIAGNOSIS — Z8249 Family history of ischemic heart disease and other diseases of the circulatory system: Secondary | ICD-10-CM | POA: Diagnosis not present

## 2023-01-02 DIAGNOSIS — I129 Hypertensive chronic kidney disease with stage 1 through stage 4 chronic kidney disease, or unspecified chronic kidney disease: Secondary | ICD-10-CM | POA: Diagnosis not present

## 2023-01-02 DIAGNOSIS — K219 Gastro-esophageal reflux disease without esophagitis: Secondary | ICD-10-CM | POA: Diagnosis not present

## 2023-01-16 DIAGNOSIS — R059 Cough, unspecified: Secondary | ICD-10-CM | POA: Diagnosis not present

## 2023-03-21 ENCOUNTER — Ambulatory Visit: Payer: Medicare HMO | Admitting: Orthopaedic Surgery

## 2023-03-21 ENCOUNTER — Encounter: Payer: Self-pay | Admitting: Orthopaedic Surgery

## 2023-03-21 VITALS — BP 142/92 | HR 74 | Ht 62.0 in | Wt 206.0 lb

## 2023-03-21 DIAGNOSIS — M654 Radial styloid tenosynovitis [de Quervain]: Secondary | ICD-10-CM

## 2023-03-21 MED ORDER — METHYLPREDNISOLONE ACETATE 40 MG/ML IJ SUSP
40.0000 mg | Freq: Once | INTRAMUSCULAR | Status: AC
  Administered 2023-03-21: 40 mg via INTRA_ARTICULAR

## 2023-03-21 NOTE — Progress Notes (Signed)
My thumb hurts.  She has pain over the first extensor compartment on the left with pain in movement with the thumb.  She has no redness, no swelling, no trauma.  She has positive Finkelstein sign left thumb, pain over the first extensor compartment, NV intact, no swelling, no redness.  Encounter Diagnosis  Name Primary?   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  I will give her a thumb splint.  Use Aspercreme, Biofreeze or Voltaren gel to the area.  She has gout.  I told her to increase her allopurinol 100 to three times a day.  Return in two weeks.  Call if any problem.  Precautions discussed.  Electronically Signed Darreld Mclean, MD 7/23/20248:43 AM

## 2023-04-04 ENCOUNTER — Ambulatory Visit: Payer: Medicare HMO | Admitting: Orthopaedic Surgery

## 2023-04-04 ENCOUNTER — Encounter: Payer: Self-pay | Admitting: Orthopaedic Surgery

## 2023-04-04 VITALS — BP 142/92 | Ht 62.0 in | Wt 206.0 lb

## 2023-04-04 DIAGNOSIS — M654 Radial styloid tenosynovitis [de Quervain]: Secondary | ICD-10-CM | POA: Diagnosis not present

## 2023-04-04 NOTE — Progress Notes (Signed)
I am much better.  She has done well from the injection into the first extensor compartment last visit.  She has little pain and full ROM.  Encounter Diagnosis  Name Primary?   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  Call if any problem.  Precautions discussed.  Electronically Signed Darreld Mclean, MD 8/6/20248:10 AM

## 2023-04-19 DIAGNOSIS — I10 Essential (primary) hypertension: Secondary | ICD-10-CM | POA: Diagnosis not present

## 2023-04-19 DIAGNOSIS — U071 COVID-19: Secondary | ICD-10-CM | POA: Diagnosis not present

## 2023-04-19 DIAGNOSIS — J441 Chronic obstructive pulmonary disease with (acute) exacerbation: Secondary | ICD-10-CM | POA: Diagnosis not present

## 2023-05-17 ENCOUNTER — Other Ambulatory Visit: Payer: Self-pay | Admitting: Family Medicine

## 2023-05-17 ENCOUNTER — Ambulatory Visit
Admission: RE | Admit: 2023-05-17 | Discharge: 2023-05-17 | Disposition: A | Discharge status: Home / Self Care | Payer: Medicare HMO | Source: Ambulatory Visit | Attending: Family Medicine | Admitting: Family Medicine

## 2023-05-17 DIAGNOSIS — R059 Cough, unspecified: Secondary | ICD-10-CM

## 2023-05-17 DIAGNOSIS — K219 Gastro-esophageal reflux disease without esophagitis: Secondary | ICD-10-CM | POA: Diagnosis not present

## 2023-05-17 DIAGNOSIS — Z6838 Body mass index (BMI) 38.0-38.9, adult: Secondary | ICD-10-CM | POA: Diagnosis not present

## 2023-05-17 DIAGNOSIS — R7303 Prediabetes: Secondary | ICD-10-CM | POA: Diagnosis not present

## 2023-05-17 DIAGNOSIS — E782 Mixed hyperlipidemia: Secondary | ICD-10-CM | POA: Diagnosis not present

## 2023-05-17 DIAGNOSIS — I10 Essential (primary) hypertension: Secondary | ICD-10-CM | POA: Diagnosis not present

## 2023-05-17 DIAGNOSIS — R0609 Other forms of dyspnea: Secondary | ICD-10-CM | POA: Diagnosis not present

## 2023-05-17 DIAGNOSIS — E559 Vitamin D deficiency, unspecified: Secondary | ICD-10-CM | POA: Diagnosis not present

## 2023-05-23 ENCOUNTER — Other Ambulatory Visit: Payer: Self-pay | Admitting: Family Medicine

## 2023-05-23 DIAGNOSIS — Z1231 Encounter for screening mammogram for malignant neoplasm of breast: Secondary | ICD-10-CM

## 2023-05-31 DIAGNOSIS — R053 Chronic cough: Secondary | ICD-10-CM | POA: Diagnosis not present

## 2023-05-31 DIAGNOSIS — E559 Vitamin D deficiency, unspecified: Secondary | ICD-10-CM | POA: Diagnosis not present

## 2023-05-31 DIAGNOSIS — N183 Chronic kidney disease, stage 3 unspecified: Secondary | ICD-10-CM | POA: Diagnosis not present

## 2023-05-31 DIAGNOSIS — I129 Hypertensive chronic kidney disease with stage 1 through stage 4 chronic kidney disease, or unspecified chronic kidney disease: Secondary | ICD-10-CM | POA: Diagnosis not present

## 2023-05-31 DIAGNOSIS — K219 Gastro-esophageal reflux disease without esophagitis: Secondary | ICD-10-CM | POA: Diagnosis not present

## 2023-06-27 ENCOUNTER — Ambulatory Visit
Admission: RE | Admit: 2023-06-27 | Discharge: 2023-06-27 | Disposition: A | Discharge status: Home / Self Care | Payer: Medicare HMO | Source: Ambulatory Visit | Attending: Family Medicine | Admitting: Family Medicine

## 2023-06-27 DIAGNOSIS — Z1231 Encounter for screening mammogram for malignant neoplasm of breast: Secondary | ICD-10-CM | POA: Diagnosis not present

## 2023-10-25 DIAGNOSIS — I10 Essential (primary) hypertension: Secondary | ICD-10-CM | POA: Diagnosis not present

## 2023-10-25 DIAGNOSIS — K219 Gastro-esophageal reflux disease without esophagitis: Secondary | ICD-10-CM | POA: Diagnosis not present

## 2023-10-25 DIAGNOSIS — R600 Localized edema: Secondary | ICD-10-CM | POA: Diagnosis not present

## 2023-10-25 DIAGNOSIS — E782 Mixed hyperlipidemia: Secondary | ICD-10-CM | POA: Diagnosis not present

## 2023-10-25 DIAGNOSIS — Z6841 Body Mass Index (BMI) 40.0 and over, adult: Secondary | ICD-10-CM | POA: Diagnosis not present

## 2023-11-14 DIAGNOSIS — Z1212 Encounter for screening for malignant neoplasm of rectum: Secondary | ICD-10-CM | POA: Diagnosis not present

## 2023-11-14 DIAGNOSIS — Z1211 Encounter for screening for malignant neoplasm of colon: Secondary | ICD-10-CM | POA: Diagnosis not present

## 2024-01-23 DIAGNOSIS — E782 Mixed hyperlipidemia: Secondary | ICD-10-CM | POA: Diagnosis not present

## 2024-01-23 DIAGNOSIS — I10 Essential (primary) hypertension: Secondary | ICD-10-CM | POA: Diagnosis not present

## 2024-01-23 DIAGNOSIS — N183 Chronic kidney disease, stage 3 unspecified: Secondary | ICD-10-CM | POA: Diagnosis not present

## 2024-01-23 DIAGNOSIS — Z0000 Encounter for general adult medical examination without abnormal findings: Secondary | ICD-10-CM | POA: Diagnosis not present

## 2024-01-23 DIAGNOSIS — D509 Iron deficiency anemia, unspecified: Secondary | ICD-10-CM | POA: Diagnosis not present

## 2024-01-23 DIAGNOSIS — R7303 Prediabetes: Secondary | ICD-10-CM | POA: Diagnosis not present

## 2024-01-24 ENCOUNTER — Ambulatory Visit (INDEPENDENT_AMBULATORY_CARE_PROVIDER_SITE_OTHER): Admitting: Gastroenterology

## 2024-01-24 ENCOUNTER — Other Ambulatory Visit: Payer: Self-pay | Admitting: *Deleted

## 2024-01-24 ENCOUNTER — Encounter: Payer: Self-pay | Admitting: *Deleted

## 2024-01-24 ENCOUNTER — Encounter: Payer: Self-pay | Admitting: Gastroenterology

## 2024-01-24 VITALS — BP 124/83 | HR 94 | Temp 97.1°F | Ht 62.0 in | Wt 219.3 lb

## 2024-01-24 DIAGNOSIS — R195 Other fecal abnormalities: Secondary | ICD-10-CM

## 2024-01-24 DIAGNOSIS — K5900 Constipation, unspecified: Secondary | ICD-10-CM | POA: Diagnosis not present

## 2024-01-24 NOTE — Progress Notes (Signed)
 Gastroenterology Office Note    Referring Provider: Ander Bame, FNP Primary Care Physician:  Adan Holms, FNP  Primary GI: Dr. Riley Cheadle    Chief Complaint   Chief Complaint  Patient presents with   New Patient (Initial Visit)    Patient here today due to having a positive cologuard test. Patient denies any current gi issues.      History of Present Illness   [name] is an 83 y.o. delightful female presenting today at the request of Adan Holms, FNP due to positive Cologuard in March 2025.   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   Last colonoscopy by Dr. Riley Cheadle in 2006. I reviewed path which showed an inflammatory polyp. Actual records not able to be seen at time of visit.   Eats popcorn and may have some left sided discomfort. Only while eating popcorn. No rectal bleeding. Eats a lot of turnip greens and stool will be dark. No bright red blood per rectum. No significant constipation and mild constipation managed with Miralax  twice a week. No diarrhea. No change in bowel habits. No weight loss or lack of appetite. No dysphagia. No GERD symptoms if avoiding trigger foods such as spicy or high fat. No N/V.    Outside labs FEb 2025  Creatinine 1.33, BUN 26GFR 40, Tbili 0.7, Alk Phos 71, AST 30, ALT 12, sodium 141, potassium 4.2. Hgb 12.9, Hct 40.5.    Past Medical History:  Diagnosis Date   Acid reflux    Arthritis    right knee pain   Gout    Gout    Hypertension    Shortness of breath     Past Surgical History:  Procedure Laterality Date   MASS EXCISION N/A 04/11/2014   Procedure: EXCISION SOFT TISSUE NEOPLASM,  BACK;  Surgeon: Beau Bound, MD;  Location: [facility];  Service: General;  Laterality: N/A;   TONSILLECTOMY     TUBAL LIGATION      Current Outpatient Medications  Medication Sig Dispense Refill   albuterol  (VENTOLIN  HFA) 108 (90 Base) MCG/ACT inhaler INHALE TWO PUFFS INTO LUNGS EVERY  SIX HOURS AS NEEDED FOR WHEEZING OR SHORTNESS OF BREATH. 18 g 1   allopurinol  (ZYLOPRIM ) 100 MG tablet Take 2 tablets (200 mg total) by mouth daily. 180 tablet 2   amLODipine  (NORVASC ) 10 MG tablet Take 1 tablet (10 mg total) by mouth daily. 90 tablet 1   aspirin EC 81 MG tablet Take 81 mg by mouth daily.     atorvastatin  (LIPITOR) 10 MG tablet Take 1 tablet (10 mg total) by mouth daily. 90 tablet 2   diclofenac  sodium (VOLTAREN ) 1 % GEL Apply 2 g topically 4 (four) times daily. Pain 100 g 3   fluticasone  (FLONASE ) 50 MCG/ACT nasal spray Place 2 sprays into both nostrils daily. 16 g 6   loratadine  (CLARITIN ) 10 MG tablet Take 1 tablet (10 mg total) by mouth daily as needed for allergies. 30 tablet 11   polyethylene glycol powder (GLYCOLAX /MIRALAX ) 17 GM/SCOOP powder Take 17 g by mouth daily as needed. 3350 g 1   triamterene -hydrochlorothiazide (MAXZIDE-25) 37.5-25 MG tablet Take 1 tablet by mouth once daily 90 tablet 0   VITAMIN D  PO Take by mouth daily.     No current facility-administered medications for this visit.    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  Tobacco Use   Smoking status: Never   Smokeless tobacco: Never  Vaping Use   Vaping status: Never Used  Substance and Sexual Activity   Alcohol  use: No   Drug use: No   Sexual activity: Yes    Birth control/protection: Post-menopausal  Other Topics Concern   Not on file  Social History Narrative   Not on file    Social Drivers of Health   Financial Resource Strain: Low Risk  (12/26/2019)   Overall Financial Resource Strain (CARDIA)    Difficulty of Paying Living Expenses: Not very hard  Food Insecurity: Not on file  Transportation Needs: Not on file  Physical Activity: Not on file  Stress: Not on file  Social Connections: Not on file  Intimate Partner Violence: Not on file     Review of Systems   Gen: Denies any fever, chills, fatigue, weight loss, lack of appetite.  CV: Denies chest pain, heart palpitations, peripheral edema, syncope.  Resp: Denies shortness of breath at rest or with exertion. Denies wheezing or cough.  GI: Denies dysphagia or odynophagia. Denies jaundice, hematemesis, fecal incontinence. GU : Denies urinary burning, urinary frequency, urinary hesitancy MS: Denies joint pain, muscle weakness, cramps, or limitation of movement.  Derm: Denies rash, itching, dry skin Psych: Denies depression, anxiety, memory loss, and confusion Heme: Denies bruising, bleeding, and enlarged lymph nodes.   Physical Exam   BP 124/83 (BP Location: Left Arm, Patient Position: Sitting, Cuff Size: Large)   Pulse 94   Temp (!) 97.1 F (36.2 C) (Temporal)   Ht 5\' 2"  (1.575 m)   Wt 219 lb 4.8 oz (99.5 kg)   BMI 40.11 kg/m  General:   Alert and oriented. Pleasant and cooperative. Well-nourished and well-developed.  Head:  Normocephalic and atraumatic. Eyes:  Without icterus Ears:  Normal auditory acuity. Lungs:  Clear to auscultation bilaterally.  Heart:  S1, S2 present without murmurs appreciated.  Abdomen:  +BS, soft, non-tender and non-distended. No HSM noted. No guarding or rebound. No masses appreciated.  Rectal:  Deferred  Msk:  Symmetrical without gross deformities. Normal posture. Extremities:  Without edema. Neurologic:  Alert and  oriented x4;  grossly normal neurologically. Skin:  Intact without significant lesions or rashes. Psych:  Alert and cooperative. Normal mood and  affect.   Assessment   [name] is a delightful 83 year old female presenting today at the request of Adan Holms, FNP due to positive Cologuard in March 2025. Last colonoscopy in 2006 with inflammatory polyp; I am unable to see procedure notes. No FH colon cancer or polyps.   She has no concerning lower or upper GI signs/symptoms. She did report darker stool with turnip greens but suspect this is related to food intake, as she does not otherwise note this. Mild constipation managed with Miralax  twice a week at most.   She is in excellent health overall and  active. Will pursue colonoscopy in near future due to positive Cologuard.      PLAN    Proceed with colonoscopy by Dr. Riley Cheadle in near future: the risks, benefits, and alternatives have been discussed with the patient in detail. The patient states understanding and desires to proceed.  Further recommendations to follow   Delman Ferns, PhD, ANP-BC [facility]

## 2024-01-24 NOTE — Patient Instructions (Signed)
 We are arranging a colonoscopy with Dr. Riley Cheadle in the near future.  It was good to meet you!  Further recommendations to follow!  It was a pleasure to see you today. I want to create trusting relationships with patients and provide genuine, compassionate, and quality care. I truly value your feedback, so please be on the lookout for a survey regarding your visit with me today. I appreciate your time in completing this!         Delman Ferns, PhD, ANP-BC [facility]

## 2024-01-24 NOTE — H&P (View-Only) (Signed)
 Gastroenterology Office Note    Referring Provider: Ander Bame, FNP Primary Care Physician:  Adan Holms, FNP  Primary GI: Dr. Riley Cheadle    Chief Complaint   Chief Complaint  Patient presents with   New Patient (Initial Visit)    Patient here today due to having a positive cologuard test. Patient denies any current gi issues.      History of Present Illness   [name] is an 83 y.o. delightful female presenting today at the request of Adan Holms, FNP due to positive Cologuard in March 2025.   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   Last colonoscopy by Dr. Riley Cheadle in 2006. I reviewed path which showed an inflammatory polyp. Actual records not able to be seen at time of visit.   Eats popcorn and may have some left sided discomfort. Only while eating popcorn. No rectal bleeding. Eats a lot of turnip greens and stool will be dark. No bright red blood per rectum. No significant constipation and mild constipation managed with Miralax  twice a week. No diarrhea. No change in bowel habits. No weight loss or lack of appetite. No dysphagia. No GERD symptoms if avoiding trigger foods such as spicy or high fat. No N/V.    Outside labs FEb 2025  Creatinine 1.33, BUN 26GFR 40, Tbili 0.7, Alk Phos 71, AST 30, ALT 12, sodium 141, potassium 4.2. Hgb 12.9, Hct 40.5.    Past Medical History:  Diagnosis Date   Acid reflux    Arthritis    right knee pain   Gout    Gout    Hypertension    Shortness of breath     Past Surgical History:  Procedure Laterality Date   MASS EXCISION N/A 04/11/2014   Procedure: EXCISION SOFT TISSUE NEOPLASM,  BACK;  Surgeon: Beau Bound, MD;  Location: [facility];  Service: General;  Laterality: N/A;   TONSILLECTOMY     TUBAL LIGATION      Current Outpatient Medications  Medication Sig Dispense Refill   albuterol  (VENTOLIN  HFA) 108 (90 Base) MCG/ACT inhaler INHALE TWO PUFFS INTO LUNGS EVERY  SIX HOURS AS NEEDED FOR WHEEZING OR SHORTNESS OF BREATH. 18 g 1   allopurinol  (ZYLOPRIM ) 100 MG tablet Take 2 tablets (200 mg total) by mouth daily. 180 tablet 2   amLODipine  (NORVASC ) 10 MG tablet Take 1 tablet (10 mg total) by mouth daily. 90 tablet 1   aspirin EC 81 MG tablet Take 81 mg by mouth daily.     atorvastatin  (LIPITOR) 10 MG tablet Take 1 tablet (10 mg total) by mouth daily. 90 tablet 2   diclofenac  sodium (VOLTAREN ) 1 % GEL Apply 2 g topically 4 (four) times daily. Pain 100 g 3   fluticasone  (FLONASE ) 50 MCG/ACT nasal spray Place 2 sprays into both nostrils daily. 16 g 6   loratadine  (CLARITIN ) 10 MG tablet Take 1 tablet (10 mg total) by mouth daily as needed for allergies. 30 tablet 11   polyethylene glycol powder (GLYCOLAX /MIRALAX ) 17 GM/SCOOP powder Take 17 g by mouth daily as needed. 3350 g 1   triamterene -hydrochlorothiazide (MAXZIDE-25) 37.5-25 MG tablet Take 1 tablet by mouth once daily 90 tablet 0   VITAMIN D  PO Take by mouth daily.     No current facility-administered medications for this visit.    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  Tobacco Use   Smoking status: Never   Smokeless tobacco: Never  Vaping Use   Vaping status: Never Used  Substance and Sexual Activity   Alcohol  use: No   Drug use: No   Sexual activity: Yes    Birth control/protection: Post-menopausal  Other Topics Concern   Not on file  Social History Narrative   Not on file    Social Drivers of Health   Financial Resource Strain: Low Risk  (12/26/2019)   Overall Financial Resource Strain (CARDIA)    Difficulty of Paying Living Expenses: Not very hard  Food Insecurity: Not on file  Transportation Needs: Not on file  Physical Activity: Not on file  Stress: Not on file  Social Connections: Not on file  Intimate Partner Violence: Not on file     Review of Systems   Gen: Denies any fever, chills, fatigue, weight loss, lack of appetite.  CV: Denies chest pain, heart palpitations, peripheral edema, syncope.  Resp: Denies shortness of breath at rest or with exertion. Denies wheezing or cough.  GI: Denies dysphagia or odynophagia. Denies jaundice, hematemesis, fecal incontinence. GU : Denies urinary burning, urinary frequency, urinary hesitancy MS: Denies joint pain, muscle weakness, cramps, or limitation of movement.  Derm: Denies rash, itching, dry skin Psych: Denies depression, anxiety, memory loss, and confusion Heme: Denies bruising, bleeding, and enlarged lymph nodes.   Physical Exam   BP 124/83 (BP Location: Left Arm, Patient Position: Sitting, Cuff Size: Large)   Pulse 94   Temp (!) 97.1 F (36.2 C) (Temporal)   Ht 5\' 2"  (1.575 m)   Wt 219 lb 4.8 oz (99.5 kg)   BMI 40.11 kg/m  General:   Alert and oriented. Pleasant and cooperative. Well-nourished and well-developed.  Head:  Normocephalic and atraumatic. Eyes:  Without icterus Ears:  Normal auditory acuity. Lungs:  Clear to auscultation bilaterally.  Heart:  S1, S2 present without murmurs appreciated.  Abdomen:  +BS, soft, non-tender and non-distended. No HSM noted. No guarding or rebound. No masses appreciated.  Rectal:  Deferred  Msk:  Symmetrical without gross deformities. Normal posture. Extremities:  Without edema. Neurologic:  Alert and  oriented x4;  grossly normal neurologically. Skin:  Intact without significant lesions or rashes. Psych:  Alert and cooperative. Normal mood and  affect.   Assessment   [name] is a delightful 83 year old female presenting today at the request of Adan Holms, FNP due to positive Cologuard in March 2025. Last colonoscopy in 2006 with inflammatory polyp; I am unable to see procedure notes. No FH colon cancer or polyps.   She has no concerning lower or upper GI signs/symptoms. She did report darker stool with turnip greens but suspect this is related to food intake, as she does not otherwise note this. Mild constipation managed with Miralax  twice a week at most.   She is in excellent health overall and  active. Will pursue colonoscopy in near future due to positive Cologuard.      PLAN    Proceed with colonoscopy by Dr. Riley Cheadle in near future: the risks, benefits, and alternatives have been discussed with the patient in detail. The patient states understanding and desires to proceed.  Further recommendations to follow   Delman Ferns, PhD, ANP-BC [facility]

## 2024-01-30 DIAGNOSIS — R195 Other fecal abnormalities: Secondary | ICD-10-CM | POA: Diagnosis not present

## 2024-01-30 DIAGNOSIS — I129 Hypertensive chronic kidney disease with stage 1 through stage 4 chronic kidney disease, or unspecified chronic kidney disease: Secondary | ICD-10-CM | POA: Diagnosis not present

## 2024-01-30 DIAGNOSIS — K219 Gastro-esophageal reflux disease without esophagitis: Secondary | ICD-10-CM | POA: Diagnosis not present

## 2024-01-30 DIAGNOSIS — R7303 Prediabetes: Secondary | ICD-10-CM | POA: Diagnosis not present

## 2024-01-30 DIAGNOSIS — N183 Chronic kidney disease, stage 3 unspecified: Secondary | ICD-10-CM | POA: Diagnosis not present

## 2024-01-30 DIAGNOSIS — R062 Wheezing: Secondary | ICD-10-CM | POA: Diagnosis not present

## 2024-02-19 DIAGNOSIS — R195 Other fecal abnormalities: Secondary | ICD-10-CM | POA: Diagnosis not present

## 2024-02-19 DIAGNOSIS — I129 Hypertensive chronic kidney disease with stage 1 through stage 4 chronic kidney disease, or unspecified chronic kidney disease: Secondary | ICD-10-CM | POA: Diagnosis not present

## 2024-02-19 DIAGNOSIS — N1831 Chronic kidney disease, stage 3a: Secondary | ICD-10-CM | POA: Diagnosis not present

## 2024-02-22 ENCOUNTER — Ambulatory Visit (HOSPITAL_COMMUNITY)
Admission: RE | Admit: 2024-02-22 | Discharge: 2024-02-22 | Disposition: A | Discharge status: Home / Self Care | Attending: Internal Medicine | Admitting: Internal Medicine

## 2024-02-22 ENCOUNTER — Ambulatory Visit (HOSPITAL_COMMUNITY): Payer: Self-pay | Admitting: Anesthesiology

## 2024-02-22 ENCOUNTER — Encounter (HOSPITAL_COMMUNITY): Payer: Self-pay | Admitting: Internal Medicine

## 2024-02-22 ENCOUNTER — Other Ambulatory Visit: Payer: Self-pay

## 2024-02-22 ENCOUNTER — Encounter (HOSPITAL_COMMUNITY)
Admission: RE | Disposition: A | Discharge status: Home / Self Care | Payer: Self-pay | Source: Home / Self Care | Attending: Internal Medicine

## 2024-02-22 DIAGNOSIS — J45909 Unspecified asthma, uncomplicated: Secondary | ICD-10-CM | POA: Insufficient documentation

## 2024-02-22 DIAGNOSIS — K6389 Other specified diseases of intestine: Secondary | ICD-10-CM | POA: Diagnosis not present

## 2024-02-22 DIAGNOSIS — R195 Other fecal abnormalities: Secondary | ICD-10-CM | POA: Diagnosis not present

## 2024-02-22 DIAGNOSIS — D123 Benign neoplasm of transverse colon: Secondary | ICD-10-CM

## 2024-02-22 DIAGNOSIS — I10 Essential (primary) hypertension: Secondary | ICD-10-CM

## 2024-02-22 DIAGNOSIS — K5730 Diverticulosis of large intestine without perforation or abscess without bleeding: Secondary | ICD-10-CM | POA: Diagnosis not present

## 2024-02-22 DIAGNOSIS — Z6841 Body Mass Index (BMI) 40.0 and over, adult: Secondary | ICD-10-CM | POA: Insufficient documentation

## 2024-02-22 DIAGNOSIS — E66813 Obesity, class 3: Secondary | ICD-10-CM | POA: Insufficient documentation

## 2024-02-22 DIAGNOSIS — K635 Polyp of colon: Secondary | ICD-10-CM | POA: Diagnosis not present

## 2024-02-22 DIAGNOSIS — Z1211 Encounter for screening for malignant neoplasm of colon: Secondary | ICD-10-CM | POA: Diagnosis not present

## 2024-02-22 HISTORY — PX: COLONOSCOPY: SHX5424

## 2024-02-22 SURGERY — COLONOSCOPY
Anesthesia: General

## 2024-02-22 MED ORDER — PROPOFOL 500 MG/50ML IV EMUL
INTRAVENOUS | Status: DC | PRN
  Administered 2024-02-22: 150 ug/kg/min via INTRAVENOUS

## 2024-02-22 MED ORDER — PROPOFOL 10 MG/ML IV BOLUS
INTRAVENOUS | Status: DC | PRN
  Administered 2024-02-22: 100 mg via INTRAVENOUS

## 2024-02-22 MED ORDER — LACTATED RINGERS IV SOLN: INTRAVENOUS | Status: DC

## 2024-02-22 NOTE — Op Note (Signed)
 [facility] Patient Name: [name] Procedure Date: 02/22/2024 10:42 AM MRN: [id number] Date of Birth: 06/08/1941 Attending MD: Maria Ozell Hollingshead , MD, [id number] CSN: [id number] Age: 83 Admit Type: Outpatient Procedure:                Colonoscopy Indications:              Positive Cologuard test Providers:                Maria Ozell Hollingshead, MD, Crystal Page, Rosina Sprague, Jon Loge Referring MD:              Medicines:                Propofol  per Anesthesia Complications:            No immediate complications. Estimated Blood Loss:     Estimated blood loss was minimal. Procedure:                Pre-Anesthesia Assessment:                           - Prior to the procedure, a History and Physical                            was performed, and patient medications and                            allergies were reviewed. The patient's tolerance of                            previous anesthesia was also reviewed. The risks                            and benefits of the procedure and the sedation                            options and risks were discussed with the patient.                            All questions were answered, and informed consent                            was obtained. Prior Anticoagulants: The patient has                            taken no anticoagulant or antiplatelet agents. ASA                            Grade Assessment: II - A patient with mild systemic                            disease. After reviewing the risks and benefits,  the patient was deemed in satisfactory condition to                            undergo the procedure.                           After obtaining informed consent, the colonoscope                            was passed under direct vision. Throughout the                            procedure, the patient's blood pressure, pulse, and                            oxygen  saturations were monitored continuously. The                            [id number]) scope was introduced through the                            anus and advanced to the the cecum, identified by                            appendiceal orifice and ileocecal valve. The                            colonoscopy was performed without difficulty. The                            patient tolerated the procedure well. The quality                            of the bowel preparation was adequate. The                            ileocecal valve, appendiceal orifice, and rectum                            were photographed. The entire colon was well                            visualized. Scope In: 11:12:48 AM Scope Out: 11:29:41 AM Scope Withdrawal Time: 0 hours 10 minutes 57 seconds  Total Procedure Duration: 0 hours 16 minutes 53 seconds  Findings:      The perianal and digital rectal examinations were normal.      Scattered medium-mouthed diverticula were found in the entire colon.      Two sessile polyps were found in the hepatic flexure. The polyps were 4       to 5 mm in size. These polyps were removed with a cold snare. Resection       and retrieval were complete. Estimated blood loss was minimal.      The exam was otherwise without abnormality on direct and retroflexion  views. Impression:               - Moderate diverticulosis in the entire examined                            colon.                           - Two 4 to 5 mm polyps at the hepatic flexure,                            removed with a cold snare. Resected and retrieved.                           - The examination was otherwise normal on direct                            and retroflexion views. Moderate Sedation:      Moderate (conscious) sedation was personally administered by an       anesthesia professional. The following parameters were monitored: oxygen       saturation, heart rate, blood pressure, respiratory rate,  EKG, adequacy       of pulmonary ventilation, and response to care. Recommendation:           - Patient has a contact number available for                            emergencies. The signs and symptoms of potential                            delayed complications were discussed with the                            patient. Return to normal activities tomorrow.                            Written discharge instructions were provided to the                            patient.                           - Advance diet as tolerated.                           - Continue present medications.                           - Repeat colonoscopy date to be determined after                            pending pathology results are reviewed for                            surveillance.                           -  Return to GI office (date not yet determined). Procedure Code(s):        --- Professional ---                           [phone], Colonoscopy, flexible; with removal of                            tumor(s), polyp(s), or other lesion(s) by snare                            technique Diagnosis Code(s):        --- Professional ---                           D12.3, Benign neoplasm of transverse colon (hepatic                            flexure or splenic flexure)                           R19.5, Other fecal abnormalities                           K57.30, Diverticulosis of large intestine without                            perforation or abscess without bleeding CPT copyright 2022 American Medical Association. All rights reserved. The codes documented in this report are preliminary and upon coder review may  be revised to meet current compliance requirements. [name]. Almeda Ezra, MD Maria Ozell Hollingshead, MD 02/22/2024 11:35:41 AM This report has been signed electronically. Number of Addenda: 0

## 2024-02-22 NOTE — Discharge Instructions (Addendum)
  Colonoscopy Discharge Instructions  Read the instructions outlined below and refer to this sheet in the next few weeks. These discharge instructions provide you with general information on caring for yourself after you leave the hospital. Your doctor may also give you specific instructions. While your treatment has been planned according to the most current medical practices available, unavoidable complications occasionally occur. If you have any problems or questions after discharge, call Dr. Shaaron at [phone]. ACTIVITY You may resume your regular activity, but move at a slower pace for the next 24 hours.  Take frequent rest periods for the next 24 hours.  Walking will help get rid of the air and reduce the bloated feeling in your belly (abdomen).  No driving for 24 hours (because of the medicine (anesthesia) used during the test).   Do not sign any important legal documents or operate any machinery for 24 hours (because of the anesthesia used during the test).  NUTRITION Drink plenty of fluids.  You may resume your normal diet as instructed by your doctor.  Begin with a light meal and progress to your normal diet. Heavy or fried foods are harder to digest and may make you feel sick to your stomach (nauseated).  Avoid alcoholic beverages for 24 hours or as instructed.  MEDICATIONS You may resume your normal medications unless your doctor tells you otherwise.  WHAT YOU CAN EXPECT TODAY Some feelings of bloating in the abdomen.  Passage of more gas than usual.  Spotting of blood in your stool or on the toilet paper.  IF YOU HAD POLYPS REMOVED DURING THE COLONOSCOPY: No aspirin products for 7 days or as instructed.  No alcohol  for 7 days or as instructed.  Eat a soft diet for the next 24 hours.  FINDING OUT THE RESULTS OF YOUR TEST Not all test results are available during your visit. If your test results are not back during the visit, make an appointment with your caregiver to find out the  results. Do not assume everything is normal if you have not heard from your caregiver or the medical facility. It is important for you to follow up on all of your test results.  SEEK IMMEDIATE MEDICAL ATTENTION IF: You have more than a spotting of blood in your stool.  Your belly is swollen (abdominal distention).  You are nauseated or vomiting.  You have a temperature over 101.  You have abdominal pain or discomfort that is severe or gets worse throughout the day.     2 polyps found and removed today  Diverticulosis throughout your colon  Colon polyp and diverticulosis information provided   further recommendations to follow pending review of pathology report

## 2024-02-22 NOTE — Anesthesia Preprocedure Evaluation (Signed)
 Anesthesia Evaluation  Patient identified by MRN, date of birth, ID band Patient awake    Reviewed: Allergy & Precautions, H&P , NPO status , Patient's Chart, lab work & pertinent test results, reviewed documented beta blocker date and time   Airway Mallampati: II  TM Distance: >3 FB Neck ROM: full    Dental no notable dental hx.    Pulmonary shortness of breath, asthma    Pulmonary exam normal breath sounds clear to auscultation       Cardiovascular Exercise Tolerance: Good hypertension,  Rhythm:regular Rate:Normal     Neuro/Psych negative neurological ROS  negative psych ROS   GI/Hepatic Neg liver ROS,GERD  ,,  Endo/Other    Class 3 obesity  Renal/GU Renal disease  negative genitourinary   Musculoskeletal   Abdominal   Peds  Hematology negative hematology ROS (+)   Anesthesia Other Findings   Reproductive/Obstetrics negative OB ROS                             Anesthesia Physical Anesthesia Plan  ASA: 3  Anesthesia Plan: General   Post-op Pain Management:    Induction:   PONV Risk Score and Plan: Propofol  infusion  Airway Management Planned:   Additional Equipment:   Intra-op Plan:   Post-operative Plan:   Informed Consent: I have reviewed the patients History and Physical, chart, labs and discussed the procedure including the risks, benefits and alternatives for the proposed anesthesia with the patient or authorized representative who has indicated his/her understanding and acceptance.     Dental Advisory Given  Plan Discussed with: CRNA  Anesthesia Plan Comments:        Anesthesia Quick Evaluation

## 2024-02-22 NOTE — Interval H&P Note (Signed)
 History and Physical Interval Note:  02/22/2024 10:51 AM  [name]  has presented today for surgery, with the diagnosis of POSITIVE COLOGUARD.  The various methods of treatment have been discussed with the patient and family. After consideration of risks, benefits and other options for treatment, the patient has consented to  Procedure(s) with comments: COLONOSCOPY (N/A) - 10;45 AM, ASA 2 as a surgical intervention.  The patient's history has been reviewed, patient examined, no change in status, stable for surgery.  I have reviewed the patient's chart and labs.  Questions were answered to the patient's satisfaction.     Donald Jacque   no change.  Diagnostic colonoscopy today.  Positive Cologuard. The risks, benefits, limitations, alternatives and imponderables have been reviewed with the patient. Questions have been answered. All parties are agreeable.

## 2024-02-22 NOTE — Transfer of Care (Signed)
 Immediate Anesthesia Transfer of Care Note  Patient: [name]  Procedure(s) Performed: COLONOSCOPY  Patient Location: Endoscopy Unit  Anesthesia Type:General  Level of Consciousness: awake, alert , oriented, and patient cooperative  Airway & Oxygen Therapy: Patient Spontanous Breathing  Post-op Assessment: Report given to RN, Post -op Vital signs reviewed and stable, and Patient moving all extremities X 4  Post vital signs: Reviewed and stable  Last Vitals:  Vitals Value Taken Time  BP 104/59 02/22/24 11:35  Temp 36.4 C 02/22/24 11:32  Pulse 92 02/22/24 11:35  Resp 26 02/22/24 11:35  SpO2 93 % 02/22/24 11:35    Last Pain:  Vitals:   02/22/24 1136  TempSrc:   PainSc: 0-No pain      Patients Stated Pain Goal: 5 (02/22/24 0920)  Complications: No notable events documented.

## 2024-02-23 ENCOUNTER — Encounter (HOSPITAL_COMMUNITY): Payer: Self-pay | Admitting: Internal Medicine

## 2024-02-23 NOTE — Anesthesia Postprocedure Evaluation (Signed)
 Anesthesia Post Note  Patient: [name]  Procedure(s) Performed: COLONOSCOPY  Patient location during evaluation: Phase II Anesthesia Type: General Level of consciousness: awake Pain management: pain level controlled Vital Signs Assessment: post-procedure vital signs reviewed and stable Respiratory status: spontaneous breathing and respiratory function stable Cardiovascular status: blood pressure returned to baseline and stable Postop Assessment: no headache and no apparent nausea or vomiting Anesthetic complications: no Comments: Late entry   No notable events documented.   Last Vitals:  Vitals:   02/22/24 1135 02/22/24 1140  BP: (!) 104/59 114/64  Pulse: 92   Resp: (!) 26   Temp:    SpO2: 93%     Last Pain:  Vitals:   02/22/24 1136  TempSrc:   PainSc: 0-No pain                 Yvonna JINNY Bosworth

## 2024-02-27 LAB — SURGICAL PATHOLOGY

## 2024-02-29 ENCOUNTER — Ambulatory Visit: Payer: Self-pay | Admitting: Internal Medicine

## 2024-04-19 ENCOUNTER — Encounter: Payer: Self-pay | Admitting: Radiology

## 2024-05-22 DIAGNOSIS — E559 Vitamin D deficiency, unspecified: Secondary | ICD-10-CM | POA: Diagnosis not present

## 2024-05-22 DIAGNOSIS — I10 Essential (primary) hypertension: Secondary | ICD-10-CM | POA: Diagnosis not present

## 2024-05-22 DIAGNOSIS — R7303 Prediabetes: Secondary | ICD-10-CM | POA: Diagnosis not present

## 2024-05-22 DIAGNOSIS — E782 Mixed hyperlipidemia: Secondary | ICD-10-CM | POA: Diagnosis not present

## 2024-05-22 DIAGNOSIS — N1831 Chronic kidney disease, stage 3a: Secondary | ICD-10-CM | POA: Diagnosis not present

## 2024-05-22 DIAGNOSIS — R141 Gas pain: Secondary | ICD-10-CM | POA: Diagnosis not present

## 2024-05-22 DIAGNOSIS — I129 Hypertensive chronic kidney disease with stage 1 through stage 4 chronic kidney disease, or unspecified chronic kidney disease: Secondary | ICD-10-CM | POA: Diagnosis not present

## 2024-06-07 ENCOUNTER — Other Ambulatory Visit: Payer: Self-pay | Admitting: Family Medicine

## 2024-06-07 DIAGNOSIS — Z1231 Encounter for screening mammogram for malignant neoplasm of breast: Secondary | ICD-10-CM

## 2024-06-26 DIAGNOSIS — I129 Hypertensive chronic kidney disease with stage 1 through stage 4 chronic kidney disease, or unspecified chronic kidney disease: Secondary | ICD-10-CM | POA: Diagnosis not present

## 2024-06-26 DIAGNOSIS — N183 Chronic kidney disease, stage 3 unspecified: Secondary | ICD-10-CM | POA: Diagnosis not present

## 2024-06-26 DIAGNOSIS — R1030 Lower abdominal pain, unspecified: Secondary | ICD-10-CM | POA: Diagnosis not present

## 2024-07-01 ENCOUNTER — Encounter: Payer: Self-pay | Admitting: Radiology

## 2024-07-02 ENCOUNTER — Ambulatory Visit
Admission: RE | Admit: 2024-07-02 | Discharge: 2024-07-02 | Disposition: A | Discharge status: Home / Self Care | Source: Ambulatory Visit | Attending: Family Medicine | Admitting: Family Medicine

## 2024-07-02 DIAGNOSIS — Z1231 Encounter for screening mammogram for malignant neoplasm of breast: Secondary | ICD-10-CM

## 2024-07-15 DIAGNOSIS — J069 Acute upper respiratory infection, unspecified: Secondary | ICD-10-CM | POA: Diagnosis not present

## 2024-07-15 DIAGNOSIS — R051 Acute cough: Secondary | ICD-10-CM | POA: Diagnosis not present

## 2024-07-15 DIAGNOSIS — R059 Cough, unspecified: Secondary | ICD-10-CM | POA: Diagnosis not present

## 2024-07-30 ENCOUNTER — Ambulatory Visit: Admitting: Internal Medicine

## 2024-07-30 ENCOUNTER — Telehealth: Payer: Self-pay | Admitting: *Deleted

## 2024-07-30 ENCOUNTER — Encounter: Payer: Self-pay | Admitting: *Deleted

## 2024-07-30 VITALS — BP 129/82 | HR 91 | Temp 97.5°F | Ht 62.0 in | Wt 209.0 lb

## 2024-07-30 DIAGNOSIS — R7989 Other specified abnormal findings of blood chemistry: Secondary | ICD-10-CM

## 2024-07-30 DIAGNOSIS — R1314 Dysphagia, pharyngoesophageal phase: Secondary | ICD-10-CM | POA: Diagnosis not present

## 2024-07-30 DIAGNOSIS — R195 Other fecal abnormalities: Secondary | ICD-10-CM | POA: Diagnosis not present

## 2024-07-30 DIAGNOSIS — K219 Gastro-esophageal reflux disease without esophagitis: Secondary | ICD-10-CM | POA: Diagnosis not present

## 2024-07-30 DIAGNOSIS — R109 Unspecified abdominal pain: Secondary | ICD-10-CM

## 2024-07-30 DIAGNOSIS — R1900 Intra-abdominal and pelvic swelling, mass and lump, unspecified site: Secondary | ICD-10-CM | POA: Diagnosis not present

## 2024-07-30 MED ORDER — PANTOPRAZOLE SODIUM 40 MG PO TBEC: 40.0000 mg | DELAYED_RELEASE_TABLET | Freq: Every day | ORAL | 11 refills | Status: DC

## 2024-07-30 NOTE — Patient Instructions (Addendum)
 It was nice to see you today.  The cause of your abdominal pain is not clear  You have a mass in your abdomen.  We will proceed with a CT of the abdomen and pelvis to further evaluate.  Baseline labs including CBC, Chem-12 and serum lipase  Eventual EGD with possible esophageal dilation  Further recommendations to follow.

## 2024-07-30 NOTE — Telephone Encounter (Signed)
 DOS for PA: 07/30/24-01/26/25

## 2024-07-30 NOTE — Progress Notes (Unsigned)
 Gastroenterology Progress Note    Primary Care Physician:  Sebastian Jerilyn HERO, FNP Primary Gastroenterologist:  Dr. Shaaron  Pre-Procedure History & Physical: HPI:  [name] is a 83 y.o. female here for further evaluation of generalized upper abdominal pain intermittently.  Not necessarily postprandially or associate with bowel function denies constipation.  Does have reflux symptoms from time to time takes a little brown oval pill prescribed by her PCP at bedtime.  Some dysphagia.  Avoids meat.  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  Recent colonoscopy by me demonstrated a couple adenomas removed no future colonoscopy recommended.  Gallbladder remains in situ.  Past Medical History:  Diagnosis Date   Acid reflux    Arthritis    right knee pain   Gout    Gout    Hypertension    Shortness of breath     Past Surgical History:  Procedure Laterality Date   COLONOSCOPY N/A 02/22/2024   Procedure: COLONOSCOPY;  Surgeon: Shaaron Lamar HERO, MD;  Location: [facility];  Service: Endoscopy;  Laterality: N/A;  10;45 AM, ASA 2   MASS EXCISION N/A 04/11/2014   Procedure: EXCISION SOFT TISSUE NEOPLASM,  BACK;  Surgeon: Oneil DELENA Budge, MD;  Location: [facility];  Service: General;  Laterality: N/A;   TONSILLECTOMY     TUBAL LIGATION      Prior to Admission medications   Medication Sig Start Date End Date Taking? Authorizing Provider  albuterol  (VENTOLIN  HFA) 108 (90 Base) MCG/ACT inhaler INHALE TWO PUFFS INTO LUNGS EVERY SIX HOURS AS NEEDED FOR WHEEZING OR SHORTNESS OF BREATH. 06/29/20  Yes [place], Theodoro FALCON, MD  allopurinol  (ZYLOPRIM ) 100 MG tablet Take 2 tablets (200 mg total) by mouth daily. 10/30/20  Yes [place], Theodoro FALCON, MD  amLODipine  (NORVASC ) 10 MG tablet Take 1 tablet (10 mg total) by mouth daily. 04/26/21  Yes Elnor Fairy HERO, NP  aspirin EC 81 MG tablet Take 81 mg by mouth daily.   Yes  [provider]  atorvastatin  (LIPITOR) 10 MG tablet Take 1 tablet (10 mg total) by mouth daily. 10/30/20  Yes [place], Theodoro FALCON, MD  diclofenac  sodium (VOLTAREN ) 1 % GEL Apply 2 g topically 4 (four) times daily. Pain 02/14/14  Yes [place], Theodoro FALCON, MD  fluticasone  (FLONASE ) 50 MCG/ACT nasal spray Place 2 sprays into both nostrils daily. 08/25/17  Yes [place], Theodoro FALCON, MD  loratadine  (CLARITIN ) 10 MG tablet Take 1 tablet (10 mg total) by mouth daily as needed for allergies. 11/30/15  Yes [place], Theodoro FALCON, MD  polyethylene glycol powder (GLYCOLAX /MIRALAX ) 17 GM/SCOOP powder Take 17 g by mouth daily as needed. 09/30/19  Yes [place], Theodoro FALCON, MD  triamterene -hydrochlorothiazide (MAXZIDE-25) 37.5-25 MG tablet Take 1 tablet by mouth once daily 11/04/20  Yes , Theodoro FALCON, MD  VITAMIN D  PO Take by mouth daily.   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  Tobacco Use   Smoking status: Never   Smokeless tobacco: Never  Vaping Use   Vaping status: Never Used  Substance and Sexual Activity   Alcohol  use: No   Drug use: No   Sexual activity: Yes    Birth control/protection: Post-menopausal  Other Topics Concern   Not on file  Social History Narrative   Not on file   Social Drivers of Health   Financial Resource Strain: Low Risk  (12/26/2019)   Overall Financial Resource Strain  (CARDIA)    Difficulty of Paying Living Expenses: Not very hard  Food Insecurity: Not on file  Transportation Needs: Not on file  Physical Activity: Not on file  Stress: Not on file  Social Connections: Not on file  Intimate Partner Violence: Not on file    Review of Systems   See HPI, otherwise negative ROS  Physical Exam: BP 129/82   Pulse 91   Temp (!) 97.5 F (36.4 C) (Temporal)   Ht 5' 2 (1.575 m)   Wt 209 lb (94.8 kg)   BMI 38.23 kg/m  General:   Alert,  Well-developed, well-nourished, pleasant and cooperative in NAD; accompanied by her granddaughter. Neck:  Supple; no masses or thyromegaly. No significant cervical adenopathy. Lungs:  Clear throughout to auscultation.   No wheezes, crackles, or rhonchi. No acute distress. Heart:  Regular rate and rhythm; no murmurs, clicks, rubs,  or gallops. Abdomen: Somewhat obese.  Positive bowel sounds.  Patient has a somewhat freely movable firm mass at the level of the umbilicus.  Tender to palpation.  This is a large lesion - cantaloupe in size by palpation..   Extremities:  Without clubbing or edema.   Impression/Plan:   Pleasant 83 year old lady with nonspecific abdominal pain of a couple of months duration.  Typical onset in the late morning.  Not associated with meals or bowel function.  Has lost 10 pounds and a conscious effort to take weight off of her knees.  Symptoms somewhat nonspecific.  He does have reflux and esophageal dysphagia.  Large abdominal mass on physical exam today.  No recent cross-sectional imaging.  Symptoms certainly do not sound reflux or biliary in origin.  In addition, does not sound like ischemia.  Colonoscopy findings recently are reassuring. She does have esophageal dysphagia in frequent GERD symptoms. Further evaluation warranted.  We need to find out what medication she is taking at bedtime.   Recommendations:  Proceed with abdominal/pelvic CT with IV and oral contrast to further evaluate the  abdominal mass.  Will add a medication for reflux pending accurate update of her current medications.  Plan labs to include c-Met CBC serum lipase  Eventual EGD with esophageal dilation after the above studies have been completed.  Discussed my impression and recommendation with patient and granddaughter present today.  All parties in agreement.   I have offered the patient a diagnostic EGD with esophageal dilation as feasible/appropriate in the near future.  The risks, benefits, limitations, alternatives and imponderables have been reviewed with the patient. Questions have been answered. All parties are agreeable.    Begin Protonix  40 mg orally once daily.  Dispense 30 with 11 refills.  Take this medication 30 minutes before breakfast.  Lab work to include hemic, CBC and serum lipase  Further recommendations to follow.   Addendum: [name] medication is Protonix  20 mg taken at bedtime nightly.  Will increase Protonix  to 40 mg daily to be taken 30 minutes before breakfast daily.   Notice: This dictation was prepared with Dragon dictation along with smaller phrase technology. Any transcriptional errors that result from this process are unintentional and may not be corrected upon review.

## 2024-07-30 NOTE — Telephone Encounter (Signed)
 Pt has been informed of CT appointment Friday,08/02/24, arrive at 2:45 pm to check in at [facility]. Mychart message also sent.

## 2024-07-30 NOTE — Telephone Encounter (Signed)
 [employer] PA:  Approved, authorization # [id number] DOS:  07/30/24-

## 2024-07-31 ENCOUNTER — Ambulatory Visit: Payer: Self-pay | Admitting: Internal Medicine

## 2024-07-31 LAB — BASIC METABOLIC PANEL WITH GFR
BUN/Creatinine Ratio: 18 (ref 12–28)
BUN: 24 mg/dL (ref 8–27)
CO2: 24 mmol/L (ref 20–29)
Calcium: 11.2 mg/dL — ABNORMAL HIGH (ref 8.7–10.3)
Chloride: 101 mmol/L (ref 96–106)
Creatinine, Ser: 1.36 mg/dL — ABNORMAL HIGH (ref 0.57–1.00)
Glucose: 87 mg/dL (ref 70–99)
Potassium: 4.7 mmol/L (ref 3.5–5.2)
Sodium: 139 mmol/L (ref 134–144)
eGFR: 39 mL/min/1.73 — ABNORMAL LOW (ref >59)

## 2024-08-01 NOTE — Progress Notes (Signed)
Faxed labs to pcp

## 2024-08-01 NOTE — Telephone Encounter (Signed)
 Received call from [place] from the [facility]. She wanted to have the updated lab work sent over to pt's PCP. Informed her that results were faxed over this morning 08/01/24.

## 2024-08-02 ENCOUNTER — Ambulatory Visit (HOSPITAL_COMMUNITY): Admission: RE | Admit: 2024-08-02 | Discharge: 2024-08-02 | Attending: Internal Medicine | Admitting: Internal Medicine

## 2024-08-02 DIAGNOSIS — R1900 Intra-abdominal and pelvic swelling, mass and lump, unspecified site: Secondary | ICD-10-CM

## 2024-08-02 DIAGNOSIS — K5730 Diverticulosis of large intestine without perforation or abscess without bleeding: Secondary | ICD-10-CM | POA: Diagnosis not present

## 2024-08-02 DIAGNOSIS — K5792 Diverticulitis of intestine, part unspecified, without perforation or abscess without bleeding: Secondary | ICD-10-CM | POA: Diagnosis not present

## 2024-08-02 DIAGNOSIS — K8020 Calculus of gallbladder without cholecystitis without obstruction: Secondary | ICD-10-CM | POA: Diagnosis not present

## 2024-08-02 DIAGNOSIS — D259 Leiomyoma of uterus, unspecified: Secondary | ICD-10-CM | POA: Diagnosis not present

## 2024-08-02 MED ORDER — IOHEXOL 300 MG/ML  SOLN
80.0000 mL | Freq: Once | INTRAMUSCULAR | Status: AC | PRN
  Administered 2024-08-02: 80 mL via INTRAVENOUS

## 2024-08-03 ENCOUNTER — Ambulatory Visit: Payer: Self-pay | Admitting: Internal Medicine

## 2024-08-07 ENCOUNTER — Telehealth: Payer: Self-pay

## 2024-08-07 DIAGNOSIS — I129 Hypertensive chronic kidney disease with stage 1 through stage 4 chronic kidney disease, or unspecified chronic kidney disease: Secondary | ICD-10-CM | POA: Diagnosis not present

## 2024-08-07 DIAGNOSIS — N950 Postmenopausal bleeding: Secondary | ICD-10-CM | POA: Diagnosis not present

## 2024-08-07 DIAGNOSIS — Z01818 Encounter for other preprocedural examination: Secondary | ICD-10-CM | POA: Diagnosis not present

## 2024-08-07 DIAGNOSIS — R7402 Elevation of levels of lactic acid dehydrogenase (LDH): Secondary | ICD-10-CM | POA: Diagnosis not present

## 2024-08-07 DIAGNOSIS — C541 Malignant neoplasm of endometrium: Secondary | ICD-10-CM | POA: Diagnosis not present

## 2024-08-07 DIAGNOSIS — R9431 Abnormal electrocardiogram [ECG] [EKG]: Secondary | ICD-10-CM | POA: Diagnosis not present

## 2024-08-07 DIAGNOSIS — N939 Abnormal uterine and vaginal bleeding, unspecified: Secondary | ICD-10-CM | POA: Diagnosis not present

## 2024-08-07 DIAGNOSIS — N1831 Chronic kidney disease, stage 3a: Secondary | ICD-10-CM | POA: Diagnosis not present

## 2024-08-07 DIAGNOSIS — E871 Hypo-osmolality and hyponatremia: Secondary | ICD-10-CM | POA: Diagnosis not present

## 2024-08-07 DIAGNOSIS — R109 Unspecified abdominal pain: Secondary | ICD-10-CM | POA: Diagnosis not present

## 2024-08-07 DIAGNOSIS — Z79899 Other long term (current) drug therapy: Secondary | ICD-10-CM | POA: Diagnosis not present

## 2024-08-07 DIAGNOSIS — M109 Gout, unspecified: Secondary | ICD-10-CM | POA: Diagnosis not present

## 2024-08-07 NOTE — Telephone Encounter (Signed)
 Contacted the [facility] and they stated that because the pt lives in [place] they would recommend we refer her to somewhere close to her.   Mandy: please send referral to [facility]

## 2024-08-07 NOTE — Telephone Encounter (Signed)
-----   Message from Lamar Hollingshead sent at 08/07/2024  9:19 AM EST -----   I got a message from [place] earlier this morning about referral.  I was going to speak to Dr. Vickii office.  I think that if the office staff could call them and find out if they have a particular gynecologist they like to use.  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  Please just call down there and ask Benjamine if they have a preference if they do not, just send to Dr. Jayne across the street.  Thanks. ----- Message ----- From: Wellington Madelin HERO, CMA Sent: 08/05/2024   7:55 AM EST To: Lamar HERO Hollingshead, MD  Dr. Vickii number is  [phone] ----- Message ----- From: Hollingshead Lamar HERO, MD Sent: 08/03/2024   4:40 PM EST To: Madelin HERO [place], CMA  Remind me to call Dr. Benjamine next week.  Can you get me your telephone number.  Thanks.

## 2024-08-07 NOTE — Progress Notes (Signed)
Referral sent to [facility].

## 2024-08-07 NOTE — Telephone Encounter (Signed)
 Will send referral to [facility]

## 2024-08-08 DIAGNOSIS — N939 Abnormal uterine and vaginal bleeding, unspecified: Secondary | ICD-10-CM | POA: Diagnosis not present

## 2024-08-08 DIAGNOSIS — I071 Rheumatic tricuspid insufficiency: Secondary | ICD-10-CM | POA: Diagnosis not present

## 2024-08-08 DIAGNOSIS — D259 Leiomyoma of uterus, unspecified: Secondary | ICD-10-CM | POA: Diagnosis not present

## 2024-08-09 DIAGNOSIS — N939 Abnormal uterine and vaginal bleeding, unspecified: Secondary | ICD-10-CM | POA: Diagnosis not present

## 2024-08-13 DIAGNOSIS — N289 Disorder of kidney and ureter, unspecified: Secondary | ICD-10-CM | POA: Diagnosis not present

## 2024-08-13 DIAGNOSIS — E782 Mixed hyperlipidemia: Secondary | ICD-10-CM | POA: Diagnosis not present

## 2024-08-16 ENCOUNTER — Telehealth: Payer: Self-pay | Admitting: *Deleted

## 2024-08-16 NOTE — Telephone Encounter (Signed)
 Spoke with the patient regarding the referral to GYN oncology. Patient scheduled as new patient with Dr Viktoria on 1/16 at 9 am.  Patient given an arrival time of 8:30 am.  Explained to the patient the the doctor will perform a pelvic exam at this visit. Patient given the policy that only one visitor allowed and that visitor must be over 16 yrs are allowed in the Cancer Center. Patient given the address/phone number for the clinic and that the center offers free valet service. Patient aware that masks optional.

## 2024-09-06 ENCOUNTER — Encounter: Admitting: Obstetrics & Gynecology

## 2024-09-09 ENCOUNTER — Encounter: Payer: Self-pay | Admitting: Psychiatry

## 2024-09-09 ENCOUNTER — Inpatient Hospital Stay: Admitting: Gynecologic Oncology

## 2024-09-09 ENCOUNTER — Inpatient Hospital Stay: Attending: Psychiatry | Admitting: Psychiatry

## 2024-09-09 VITALS — BP 146/78 | HR 98 | Temp 98.2°F | Resp 19 | Ht 62.0 in | Wt 202.6 lb

## 2024-09-09 DIAGNOSIS — R9389 Abnormal findings on diagnostic imaging of other specified body structures: Secondary | ICD-10-CM | POA: Insufficient documentation

## 2024-09-09 DIAGNOSIS — K219 Gastro-esophageal reflux disease without esophagitis: Secondary | ICD-10-CM | POA: Diagnosis not present

## 2024-09-09 DIAGNOSIS — M1990 Unspecified osteoarthritis, unspecified site: Secondary | ICD-10-CM | POA: Insufficient documentation

## 2024-09-09 DIAGNOSIS — N950 Postmenopausal bleeding: Secondary | ICD-10-CM | POA: Diagnosis not present

## 2024-09-09 DIAGNOSIS — Z803 Family history of malignant neoplasm of breast: Secondary | ICD-10-CM | POA: Insufficient documentation

## 2024-09-09 DIAGNOSIS — I129 Hypertensive chronic kidney disease with stage 1 through stage 4 chronic kidney disease, or unspecified chronic kidney disease: Secondary | ICD-10-CM | POA: Diagnosis not present

## 2024-09-09 DIAGNOSIS — N189 Chronic kidney disease, unspecified: Secondary | ICD-10-CM | POA: Diagnosis not present

## 2024-09-09 DIAGNOSIS — Z800 Family history of malignant neoplasm of digestive organs: Secondary | ICD-10-CM | POA: Diagnosis not present

## 2024-09-09 DIAGNOSIS — C541 Malignant neoplasm of endometrium: Secondary | ICD-10-CM

## 2024-09-09 DIAGNOSIS — M109 Gout, unspecified: Secondary | ICD-10-CM | POA: Diagnosis not present

## 2024-09-09 DIAGNOSIS — J45909 Unspecified asthma, uncomplicated: Secondary | ICD-10-CM | POA: Diagnosis not present

## 2024-09-09 MED ORDER — SENNOSIDES-DOCUSATE SODIUM 8.6-50 MG PO TABS: 2.0000 | ORAL_TABLET | Freq: Every day | ORAL | 0 refills | Status: DC

## 2024-09-09 MED ORDER — TRAMADOL HCL 50 MG PO TABS: 50.0000 mg | ORAL_TABLET | Freq: Four times a day (QID) | ORAL | 0 refills | Status: DC | PRN

## 2024-09-09 NOTE — Patient Instructions (Addendum)
 Today Dr. Eldonna performed an endometrial biopsy which is a sample from the lining of the uterus.  Plan on having a CT scan of your chest before surgery.  Preparing for your Surgery  Plan for surgery on 09/17/2024 with Dr. Hoy Eldonna at [facility]. You will be scheduled for robotic assisted total laparoscopic hysterectomy (removal of the uterus and cervix), bilateral salpingo-oophorectomy (removal of both ovaries and fallopian tubes), sentinel lymph node biopsy, possible lymph node dissection, possible laparotomy (larger incision on your abdomen if needed).   Pre-operative Testing -You will receive a phone call from presurgical testing at [facility] to arrange for a pre-operative appointment and lab work.  -Bring your insurance card, copy of an advanced directive if applicable, medication list  -At that visit, you will be asked to sign a consent for a possible blood transfusion in case a transfusion becomes necessary during surgery.  The need for a blood transfusion is rare but having consent is a necessary part of your care.     -You can continue your aspirin 81 mg with the last dose being the day BEFORE surgery in the am. Do not take the morning of surgery.  -Do not take supplements such as fish oil (omega 3), red yeast rice, turmeric before your surgery. STOP TAKING AT LEAST 10 DAYS BEFORE SURGERY. You want to avoid medications with aspirin in them including headache powders such as BC or Goody's), Excedrin migraine.  -If you are taking a GLP-1 medication/injection such as Ozempic, Mounjaro, [id number], this needs to be held before surgery for at least 7 days before.  Day Before Surgery at Home -You will be asked to take in a light diet the day before surgery. You will be advised you can have clear liquids up until 3 hours before your surgery.    Eat a light diet the day before surgery.  Examples including soups, broths, toast, yogurt, mashed potatoes.  AVOID GAS  PRODUCING FOODS AND BEVERAGES. Things to avoid include carbonated beverages (fizzy beverages, sodas), raw fruits and raw vegetables (uncooked), or beans.   If your bowels are filled with gas, your surgeon will have difficulty visualizing your pelvic organs which increases your surgical risks.  Your role in recovery Your role is to become active as soon as directed by your doctor, while still giving yourself time to heal.  Rest when you feel tired. You will be asked to do the following in order to speed your recovery:  - Cough and breathe deeply. This helps to clear and expand your lungs and can prevent pneumonia after surgery.  - STAY ACTIVE WHEN YOU GET HOME. Do mild physical activity. Walking or moving your legs help your circulation and body functions return to normal. Do not try to get up or walk alone the first time after surgery.   -If you develop swelling on one leg or the other, pain in the back of your leg, redness/warmth in one of your legs, please call the office or go to the Emergency Room to have a doppler to rule out a blood clot. For shortness of breath, chest pain-seek care in the Emergency Room as soon as possible. - Actively manage your pain. Managing your pain lets you move in comfort. We will ask you to rate your pain on a scale of zero to 10. It is your responsibility to tell your doctor or nurse where and how much you hurt so your pain can be treated.  Special Considerations -If you are  diabetic, you may be placed on insulin after surgery to have closer control over your blood sugars to promote healing and recovery.  This does not mean that you will be discharged on insulin.  If applicable, your oral antidiabetics will be resumed when you are tolerating a solid diet.  -Your final pathology results from surgery should be available around one week after surgery and the results will be relayed to you when available.  -Dr. Olam Mill is the surgeon that assists your GYN  Oncologist with surgery.  If you end up staying the night, the next day after your surgery you will either see Dr. Viktoria, Dr. Eldonna, or Dr. Olam Mill.  -FMLA forms can be faxed to [phone] and please allow 5-7 business days for completion.  Pain Management After Surgery -You will be prescribed your pain medication and bowel regimen medications before surgery so that you can have these available when you are discharged from the hospital. The pain medication is for use ONLY AFTER surgery and a new prescription will not be given.   -Make sure that you have Tylenol  IF YOU ARE ABLE TO TAKE THESE MEDICATION at home to use on a regular basis after surgery for pain control.   -Review the attached handout on narcotic use and their risks and side effects.   Bowel Regimen -You will be prescribed Sennakot-S to take nightly to prevent constipation especially if you are taking the narcotic pain medication intermittently.  It is important to prevent constipation and drink adequate amounts of liquids. You can stop taking this medication when you are not taking pain medication and you are back on your normal bowel routine.  Risks of Surgery Risks of surgery are low but include bleeding, infection, damage to surrounding structures, re-operation, blood clots, and very rarely death.   Blood Transfusion Information (For the consent to be signed before surgery)  We will be checking your blood type before surgery so in case of emergencies, we will know what type of blood you would need.                                            WHAT IS A BLOOD TRANSFUSION?  A transfusion is the replacement of blood or some of its parts. Blood is made up of multiple cells which provide different functions. Red blood cells carry oxygen and are used for blood loss replacement. White blood cells fight against infection. Platelets control bleeding. Plasma helps clot blood. Other blood products are available for  specialized needs, such as hemophilia or other clotting disorders. BEFORE THE TRANSFUSION  Who gives blood for transfusions?  You may be able to donate blood to be used at a later date on yourself (autologous donation). Relatives can be asked to donate blood. This is generally not any safer than if you have received blood from a stranger. The same precautions are taken to ensure safety when a relative's blood is donated. Healthy volunteers who are fully evaluated to make sure their blood is safe. This is blood bank blood. Transfusion therapy is the safest it has ever been in the practice of medicine. Before blood is taken from a donor, a complete history is taken to make sure that person has no history of diseases nor engages in risky social behavior (examples are intravenous drug use or sexual activity with multiple partners). The donor's travel history is  screened to minimize risk of transmitting infections, such as malaria. The donated blood is tested for signs of infectious diseases, such as HIV and hepatitis. The blood is then tested to be sure it is compatible with you in order to minimize the chance of a transfusion reaction. If you or a relative donates blood, this is often done in anticipation of surgery and is not appropriate for emergency situations. It takes many days to process the donated blood. RISKS AND COMPLICATIONS Although transfusion therapy is very safe and saves many lives, the main dangers of transfusion include:  Getting an infectious disease. Developing a transfusion reaction. This is an allergic reaction to something in the blood you were given. Every precaution is taken to prevent this. The decision to have a blood transfusion has been considered carefully by your caregiver before blood is given. Blood is not given unless the benefits outweigh the risks.  AFTER SURGERY INSTRUCTIONS  Return to work: 4-6 weeks if applicable  Activity: 1. Be up and out of the bed during the  day.  Take a nap if needed.  You may walk up steps but be careful and use the hand rail.  Stair climbing will tire you more than you think, you may need to stop part way and rest.   2. No lifting or straining for 6 weeks over 10 pounds. No pushing, pulling, straining for 6 weeks.  3. No driving for 4-89 days when the following criteria have been met: Do not drive if you are taking narcotic pain medicine and make sure that your reaction time has returned.   4. You can shower as soon as the next day after surgery. Shower daily.  Use your regular soap and water (not directly on the incision) and pat your incision(s) dry afterwards; don't rub.  No tub baths or submerging your body in water until cleared by your surgeon. If you have the soap that was given to you by pre-surgical testing that was used before surgery, you do not need to use it afterwards because this can irritate your incisions.   5. No sexual activity and nothing in the vagina for 12 weeks.  6. You may experience a small amount of clear drainage from your incisions, which is normal.  If the drainage persists, increases, or changes color please call the office.  7. Do not use creams, lotions, or ointments such as neosporin on your incisions after surgery until advised by your surgeon because they can cause removal of the dermabond glue on your incisions.    8. You may experience vaginal spotting after surgery or when the stitches at the top of the vagina begin to dissolve.  The spotting is normal but if you experience heavy bleeding, call our office.  9. Take Tylenol  first for pain if you are able to take these medication and only use narcotic pain medication for severe pain not relieved by the Tylenol .  Monitor your Tylenol  intake to a max of 4,000 mg in a 24 hour period.   Diet: 1. Low sodium Heart Healthy Diet is recommended but you are cleared to resume your normal (before surgery) diet after your procedure.  2. It is safe to use a  laxative, such as Miralax  or Colace, if you have difficulty moving your bowels before surgery. You have been prescribed Sennakot-S to take at bedtime every evening after surgery to keep bowel movements regular and to prevent constipation.    Wound Care: 1. Keep clean and dry.  Shower  daily.  Reasons to call the Doctor: Fever - Oral temperature greater than 100.4 degrees Fahrenheit Foul-smelling vaginal discharge Difficulty urinating Nausea and vomiting Increased pain at the site of the incision that is unrelieved with pain medicine. Difficulty breathing with or without chest pain New calf pain especially if only on one side Sudden, continuing increased vaginal bleeding with or without clots.   Contacts: For questions or concerns you should contact:  Dr. [name] at [phone]  Eleanor Epps, NP at [phone]  After Hours: call [phone] and have the GYN Oncologist paged/contacted (after 5 pm or on the weekends). You will speak with an after hours RN and let he or she know you have had surgery.  Messages sent via mychart are for non-urgent matters and are not responded to after hours so for urgent needs, please call the after hours number.

## 2024-09-09 NOTE — Progress Notes (Signed)
 Patient here for a consult with Dr. Eldonna and for a pre-operative appointment prior to her scheduled surgery on 09/17/2024. She is scheduled for a robotic assisted total laparoscopic hysterectomy, bilateral salpingo-oophorectomy, sentinel lymph node biopsy, possible lymph node dissection, possible laparotomy. The surgery was discussed in detail.  See after visit summary for additional details.      Discussed post-op pain management in detail including the aspects of the enhanced recovery pathway.  Advised her that a new prescription would be sent in for Tramadol  and it is only to be used for after her upcoming surgery.  We discussed the use of tylenol  post-op and to monitor for a maximum of 4,000 mg in a 24 hour period.  Also prescribed sennakot to be used after surgery and to hold if having loose stools.  Discussed bowel regimen in detail.     Discussed the use of SCDs and measures to take at home to prevent DVT including frequent mobility.  Reportable signs and symptoms of DVT discussed. Post-operative instructions discussed and expectations for after surgery. Incisional care discussed as well including reportable signs and symptoms including erythema, drainage, wound separation.     30 minutes spent with the patient.  Verbalizing understanding of material discussed. No needs or concerns voiced at the end of the visit.   Advised patient to call for any needs.  Advised that her post-operative medications had been prescribed and could be picked up at any time.    This appointment is included in the global surgical bundle as pre-operative teaching and has no charge.

## 2024-09-09 NOTE — H&P (View-Only) (Signed)
 GYNECOLOGIC ONCOLOGY NEW PATIENT CONSULTATION  Date of Service: 09/09/2024 Referring Provider: Malachi Motes, MD   ASSESSMENT AND PLAN: [name] is a 84 y.o. woman with postmenopausal bleeding and thickened endometrium with recent D&C pathology showing necrotic endometrium, cannot exclude malignancy.  Reviewed workup to date.  Reviewed concerning findings on imaging that could be suggestive of an endometrial cancer.  Also reviewed the possibility of uterine sarcoma.  Recommend surgery for definitive diagnosis and treatment.  Attempt made additional biopsy today to see if we would be able to obtain a preoperative diagnosis.  We will follow-up these results.  Otherwise, reviewed with patient that we would likely send specimen for frozen pathology to evaluate diagnosis at time of surgery.  Given that she has already undergone a dilation and curettage, I do not feel that another attempt beyond this endometrial biopsy to obtain a diagnosis preoperatively is indicated and would proceed with hysterectomy for definitive diagnosis.  Patient amenable with this.  Patient was consented for: Robotic assisted total laparoscopic hysterectomy, bilateral salpingo-oophorectomy, sentinel lymph node evaluation and biopsy, possible lymphadenectomy, possible laparotomy and at least mini laparotomy for specimen retrieval on 09/17/24.  The risks of surgery were discussed in detail and she understands these to including but not limited to bleeding requiring a blood transfusion, infection, injury to adjacent organs (including but not limited to the bowels, bladder, ureters, nerves, blood vessels), thromboembolic events, wound separation, hernia, vaginal cuff separation, possible risk of lymphedema and lymphocyst if lymphadenectomy performed, unforseen complication, possible need for re-exploration, and medical complications such as heart attack, stroke, pneumonia.  If the patient experiences any of these events, she  understands that her hospitalization or recovery may be prolonged and that she may need to take additional medications for a prolonged period. The patient will receive DVT and antibiotic prophylaxis as indicated. She voiced a clear understanding. She had the opportunity to ask questions and informed consent was obtained today. She wishes to proceed.  Given concern for possible endometrial or uterine cancer, will obtain CT chest to rule out concern for metastatic lesions. She does not require preoperative clearance. Her METs are >4.  All preoperative instructions were reviewed. Postoperative expectations were also reviewed. Written handouts were provided to the patient.   A copy of this note was sent to the patient's referring provider.  Maria Masters, MD Gynecologic Oncology   Medical Decision Making I personally spent  TOTAL 60 minutes face-to-face and non-face-to-face in the care of this patient, which includes all pre, intra, and post visit time on the date of service.   ------------  CC: Postmenopausal bleeding, endometrial thickening  HISTORY OF PRESENT ILLNESS:  [name] is a 84 y.o. woman who is seen in consultation at the request of Malachi Motes, MD for evaluation of postmenopausal bleeding, endometrial thickening.  Patient was seen by GI in December for generalized abdominal pain intermittently.  She had recently undergone a colposcopy which demonstrated a couple of adenomas with no future colonoscopy recommended.  A CT scan was ordered.  She underwent CT abdomen/pelvis on 08/02/2024 which noted thickening of the endometrium measuring 7 cm with abnormal enhancing nodular soft tissue along the posterior aspect measuring 2.3 x 5.4 x 4.1 cm concerning for endometrial carcinoma.  The uterus was noted to also be markedly enlarged with several fibroids measuring 14.4 x 18.3 x 17.4 cm.  She was also noted to have cholelithiasis  Patient was then seen by OB/GYN on 08/07/2024.   She knew that time that she  began bleeding that day.  She underwent D&C on 08/08/2024.  Final pathology showed fragments of necrotic apparent endometrium, cannot exclude malignancy.  Today patient presents with her daughter.  She reports ongoing dark spotting but no bleeding or spotting before December.  Reports ongoing occasional crampy abdominal pain that last about 15 to 20 minutes and occurs daily.  This has been ongoing for about 1 to 2 months.  Currently her pain is not as bad as it was before.  Also notes about an 18 pound weight loss in the past 6 months which she attributes to changes in her diet by cutting out junk food and carbs for the past 6 months to aid with her arthritic knee pain.  She otherwise denies abdominal bloating, early satiety, change in bowel or bladder habits.   Pt reports that in the setting of her asthma she uses her inhaler 1-2x/day. She reports that she can climb a flight of stairs without chest pain.    PAST MEDICAL HISTORY: Past Medical History:  Diagnosis Date   Acid reflux    Arthritis    right knee pain   Asthma    Chronic kidney disease    Gout    Gout    Hypertension    Shortness of breath     PAST SURGICAL HISTORY: Past Surgical History:  Procedure Laterality Date   COLONOSCOPY N/A 02/22/2024   Procedure: COLONOSCOPY;  Surgeon: Shaaron Lamar HERO, MD;  Location: [facility];  Service: Endoscopy;  Laterality: N/A;  10;45 AM, ASA 2   MASS EXCISION N/A 04/11/2014   Procedure: EXCISION SOFT TISSUE NEOPLASM,  BACK;  Surgeon: Oneil DELENA Budge, MD;  Location: [facility];  Service: General;  Laterality: N/A;   TONSILLECTOMY     TUBAL LIGATION      OB/GYN HISTORY: OB History  Gravida Para Term Preterm AB Living  5 5 5   5   SAB IAB Ectopic Multiple Live Births      5    # Outcome Date GA Lbr Len/2nd Weight Sex Type Anes PTL Lv  5 Term      Vag-Spont   LIV  4 Term      Vag-Spont   LIV  3 Term      Vag-Spont   LIV  2 Term      Vag-Spont   LIV  1 Term       Vag-Spont   LIV      Age at menarche: unknown Age at menopause: 19 Hx of HRT: no Hx of STI: no Last pap: unknown History of abnormal pap smears: none  SCREENING STUDIES:  Last mammogram: 06/2024 Last colonoscopy: 2025  MEDICATIONS: Current Medications[1]  ALLERGIES: Allergies[2]  FAMILY HISTORY: Family History  Problem Relation Age of Onset   Stomach cancer Mother    Diabetes Father    Breast cancer Niece 23 - 64   BRCA 1/2 Neg Hx    Colon cancer Neg Hx    Colon polyps Neg Hx    Ovarian cancer Neg Hx    Endometrial cancer Neg Hx     SOCIAL HISTORY: Social History   Socioeconomic History   Marital status: Widowed    Spouse name: Not on file   Number of children: Not on file   Years of education: Not on file   Highest education level: Not on file  Occupational History   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 [place]    Comment: CNA  Tobacco Use  Smoking status: Never   Smokeless tobacco: Never  Vaping Use   Vaping status: Never Used  Substance and Sexual Activity   Alcohol  use: No   Drug use: No   Sexual activity: Yes    Birth control/protection: Post-menopausal  Other Topics Concern   Not on file  Social History Narrative   Not on file   Social Drivers of Health   Tobacco Use: Low Risk (08/07/2024)   Received from [facility]   Patient History    Smoking Tobacco Use: Never    Smokeless Tobacco Use: Never    Passive Exposure: Not on file  Financial Resource Strain: Low Risk (08/07/2024)   Received from [facility]   Overall Financial Resource Strain (CARDIA)    How hard is it for you to pay for the very basics like food, housing, medical care, and heating?: Not hard at all  Food Insecurity: No Food Insecurity (08/07/2024)   Received from [facility]   Epic    Within the past 12 months, you worried that your food would run out before you got the money to buy more.: Never true    Within the past 12 months, the  food you bought just didn't last and you didn't have money to get more.: Never true  Transportation Needs: No Transportation Needs (08/07/2024)   Received from [facility] - Transportation    Lack of Transportation (Medical): No    Lack of Transportation (Non-Medical): No  Physical Activity: Not on file  Stress: Not on file  Social Connections: Not on file  Intimate Partner Violence: Not At Risk (08/07/2024)   Received from [facility]   Epic    Within the last year, have you been afraid of your partner or ex-partner?: No    Within the last year, have you been humiliated or emotionally abused in other ways by your partner or ex-partner?: No    Within the last year, have you been kicked, hit, slapped, or otherwise physically hurt by your partner or ex-partner?: No    Within the last year, have you been raped or forced to have any kind of sexual activity by your partner or ex-partner?: No  Depression (PHQ2-9): Not on file  Alcohol  Screen: Not on file  Housing: Not on file  Utilities: Low Risk (08/07/2024)   Received from [facility]   Utilities    Within the past 12 months, have you been unable to get utilities(heat, electricity) when it was really needed?: No  Health Literacy: Not on file    REVIEW OF SYSTEMS: New patient intake form was reviewed.  Complete 10-system review is negative except for the following: Joint pain, occasional headache, bleeding, occasional wheezing, numbness with standing a long time in her low back, occasional dizziness when she bends over, cough, leg swelling, hot flashes  PHYSICAL EXAM: BP (!) 153/77 (BP Location: Left Arm, Patient Position: Sitting)   Pulse 98   Temp 98.2 F (36.8 C) (Oral)   Resp 19   Ht 5' 2 (1.575 m)   Wt 202 lb 9.6 oz (91.9 kg)   SpO2 100%   BMI 37.06 kg/m  Constitutional: No acute distress. Neuro/Psych: Alert, oriented.  Head and Neck: Normocephalic, atraumatic. Neck symmetric without masses. Sclera  anicteric.  Respiratory: Normal work of breathing. Clear to auscultation bilaterally. Cardiovascular: Regular rate and rhythm, no murmurs, rubs, or gallops. Abdomen: Normoactive bowel sounds. Soft, non-distended, non-tender to palpation. No masses appreciated. Uterus  palpated to around the level of the umbilicus. Extremities: Grossly normal range of motion. Warm, well perfused. No edema bilaterally. Skin: No rashes or lesions. Lymphatic: No cervical, supraclavicular, or inguinal adenopathy. Genitourinary: External genitalia without lesions. Urethral meatus without lesions or prolapse. On speculum exam, vagina and cervix without lesions. Bimanual exam reveals normal cervix, enlarged uterus to approximately 1cm above the umbilicus, mobilizes off of the bilateral pelvic walls. Exam chaperoned by Maria Epps, NP  EMB Procedure: After appropriate verbal informed consent was obtained, a timeout was performed. A sterile speculum was placed in the vagina, and the area was cleaned with betadine  x3. A single-tooth tenaculum was placed on the anterior lip of the cervix. The os finder was used to dilate the cervix. A tenaculum was then placed on the posterior lip of the cervix to straighten the endocervical canal. An endometrial biopsy pipelle was advanced carefully to the uterine fundus which sounded to >10cm. An abundant sample was obtained over 2 passes. The tenaculum was removed, and tenaculum sites were noted to be hemostatic. The speculum was removed from the vagina. The patient tolerated the procedure well.   UPT: not indicated   LABORATORY AND RADIOLOGIC DATA: Outside medical records were reviewed to synthesize the above history, along with the history and physical obtained during the visit.  Outside laboratory, pathology, and imaging reports were reviewed, with pertinent results below.  I personally reviewed the outside images.  WBC  Date Value Ref Range Status  03/22/2021 5.1 3.4 - 10.8 x10E3/uL  Final  10/30/2020 4.7 3.8 - 10.8 Thousand/uL Final   Hemoglobin  Date Value Ref Range Status  03/22/2021 13.3 11.1 - 15.9 g/dL Final   Hematocrit  Date Value Ref Range Status  03/22/2021 39.3 34.0 - 46.6 % Final   Platelets  Date Value Ref Range Status  03/22/2021 283 150 - 450 x10E3/uL Final   Magnesium  Date Value Ref Range Status  05/17/2019 1.6 1.5 - 2.5 mg/dL Final   Creat  Date Value Ref Range Status  10/30/2020 1.10 (H) 0.60 - 0.93 mg/dL Final    Comment:    For patients >53 years of age, the reference limit for Creatinine is approximately 13% higher for people identified as African-American. .    Creatinine, Ser  Date Value Ref Range Status  07/30/2024 1.36 (H) 0.57 - 1.00 mg/dL Final   AST  Date Value Ref Range Status  03/22/2021 40 0 - 40 IU/L Final   ALT  Date Value Ref Range Status  03/22/2021 14 0 - 32 IU/L Final   Surgical pathology (08/08/24): Specimen 1: Endometrium, biopsy   - FRAGMENTS OF NECROTIC APPARENT ENDOMETRIUM, CANNOT EXCLUDE EIN/MALIGNANCY.   SEE COMMENT.   - FRAGMENTS OF BENIGN ATROPHIC SQUAMOUS MUCOSA.   - EXTENSIVE BACKGROUND HEMORRHAGE.    CT ABDOMEN PELVIS W CONTRAST 08/02/2024  Narrative CLINICAL DATA:  abdominal mass  EXAM: CT ABDOMEN AND PELVIS WITH CONTRAST  TECHNIQUE: Multidetector CT imaging of the abdomen and pelvis was performed using the standard protocol following bolus administration of intravenous contrast.  RADIATION DOSE REDUCTION: This exam was performed according to the departmental dose-optimization program which includes automated exposure control, adjustment of the mA and/or kV according to patient size and/or use of iterative reconstruction technique.  CONTRAST:  80mL OMNIPAQUE  IOHEXOL  300 MG/ML  SOLN  COMPARISON:  None available.  FINDINGS: Lower chest: No focal airspace consolidation or pleural effusion. 3 mm right lower lobe nodule centrally (axial 7). Subpleural nodule in the left lower  lobe (  axial 6), measuring 3 mm. Mild cardiomegaly.  Hepatobiliary: No mass.Gallbladder is full of innumerable small stones. No wall thickening. No intrahepatic or extrahepatic biliary ductal dilation. The portal veins are patent.  Pancreas: No mass or main ductal dilation. No peripancreatic inflammation or fluid collection.  Spleen: In the medial splenic dome, there is a small 7 mm hypodensity (axial 13), which could represent a cyst or hemangioma.  Adrenals/Urinary Tract: No adrenal masses. A couple of small cysts are noted in both kidneys. No nephrolithiasis or hydronephrosis. Partially distended urinary bladder without visualized abnormality.  Stomach/Bowel: The stomach is decompressed without focal abnormality. No small bowel wall thickening or inflammation. No small bowel obstruction.Normal appendix. Total colonic diverticulosis. No changes of acute diverticulitis.  Vascular/Lymphatic: No aortic aneurysm. No intraabdominal or pelvic lymphadenopathy.  Reproductive: Marked enlargement of the uterus, which measures 14.4 x 18.3 x 17.4 cm. Multiple calcified uterine fibroids are present. There is severe thickening of the endometrium, which measures 7 cm in AP dimension. Abnormal, enhancing, nodular soft tissue along the posterior aspect of the endometrium (axial 39-47), measures 2.3 x 5.4 x 4.1 cm.No free pelvic fluid.  Other: No pneumoperitoneum, ascites, or mesenteric inflammation.  Musculoskeletal: No acute fracture or destructive lesion. Multilevel degenerative disc disease of the spine. Advanced facet arthropathy in the lower lumbar spine. Mild grade 1 anterolisthesis of L4 on L5. Mild bilateral hip osteoarthritis.  IMPRESSION: 1. Severe thickening of the endometrium, which measures 7 cm in AP dimension, with abnormally enhancing, nodular soft tissue along the posterior aspect, measuring 2.3 x 5.4 x 4.1 cm. This is especially concerning for endometrial carcinoma.  Gynecologic consultation for further management recommended. 2. Markedly enlarged uterus containing multiple involuted calcified fibroids. Overall, the uterus measures 14.4 x 18.3 x 17.4 cm. 3. Total colonic diverticulosis. No changes of acute diverticulitis. 4. Cholecystolithiasis.  These results will be called to the ordering clinician or representative by the Radiologist Assistant and communication documented in the PACS or [employer].  Aortic Atherosclerosis (ICD10-I70.0).   Electronically Signed By: Rogelia Myers M.D. On: 08/02/2024 15:54     [1]  Current Outpatient Medications:    albuterol  (VENTOLIN  HFA) 108 (90 Base) MCG/ACT inhaler, INHALE TWO PUFFS INTO LUNGS EVERY SIX HOURS AS NEEDED FOR WHEEZING OR SHORTNESS OF BREATH., Disp: 18 g, Rfl: 1   allopurinol  (ZYLOPRIM ) 100 MG tablet, Take 2 tablets (200 mg total) by mouth daily., Disp: 180 tablet, Rfl: 2   amLODipine  (NORVASC ) 10 MG tablet, Take 1 tablet (10 mg total) by mouth daily., Disp: 90 tablet, Rfl: 1   aspirin EC 81 MG tablet, Take 81 mg by mouth daily., Disp: , Rfl:    atorvastatin  (LIPITOR) 10 MG tablet, Take 1 tablet (10 mg total) by mouth daily., Disp: 90 tablet, Rfl: 2   diclofenac  sodium (VOLTAREN ) 1 % GEL, Apply 2 g topically 4 (four) times daily. Pain, Disp: 100 g, Rfl: 3   fluticasone  (FLONASE ) 50 MCG/ACT nasal spray, Place 2 sprays into both nostrils daily., Disp: 16 g, Rfl: 6   loratadine  (CLARITIN ) 10 MG tablet, Take 1 tablet (10 mg total) by mouth daily as needed for allergies., Disp: 30 tablet, Rfl: 11   pantoprazole  (PROTONIX ) 40 MG tablet, Take 1 tablet (40 mg total) by mouth daily. 30 minutes before breakfast, Disp: 30 tablet, Rfl: 11   polyethylene glycol powder (GLYCOLAX /MIRALAX ) 17 GM/SCOOP powder, Take 17 g by mouth daily as needed., Disp: 3350 g, Rfl: 1   triamterene -hydrochlorothiazide (MAXZIDE-25) 37.5-25 MG tablet, Take 1 tablet by  mouth once daily, Disp: 90 tablet, Rfl: 0   VITAMIN D   PO, Take by mouth daily., Disp: , Rfl:  [2]  Allergies Allergen Reactions   Codeine Other (See Comments)    Pt states I feel like I am going to pass out.

## 2024-09-09 NOTE — Progress Notes (Signed)
 GYNECOLOGIC ONCOLOGY NEW PATIENT CONSULTATION  Date of Service: 09/09/2024 Referring Provider: Malachi Motes, MD   ASSESSMENT AND PLAN: [name] is a 84 y.o. woman with postmenopausal bleeding and thickened endometrium with recent D&C pathology showing necrotic endometrium, cannot exclude malignancy.  Reviewed workup to date.  Reviewed concerning findings on imaging that could be suggestive of an endometrial cancer.  Also reviewed the possibility of uterine sarcoma.  Recommend surgery for definitive diagnosis and treatment.  Attempt made additional biopsy today to see if we would be able to obtain a preoperative diagnosis.  We will follow-up these results.  Otherwise, reviewed with patient that we would likely send specimen for frozen pathology to evaluate diagnosis at time of surgery.  Given that she has already undergone a dilation and curettage, I do not feel that another attempt beyond this endometrial biopsy to obtain a diagnosis preoperatively is indicated and would proceed with hysterectomy for definitive diagnosis.  Patient amenable with this.  Patient was consented for: Robotic assisted total laparoscopic hysterectomy, bilateral salpingo-oophorectomy, sentinel lymph node evaluation and biopsy, possible lymphadenectomy, possible laparotomy and at least mini laparotomy for specimen retrieval on 09/17/24.  The risks of surgery were discussed in detail and she understands these to including but not limited to bleeding requiring a blood transfusion, infection, injury to adjacent organs (including but not limited to the bowels, bladder, ureters, nerves, blood vessels), thromboembolic events, wound separation, hernia, vaginal cuff separation, possible risk of lymphedema and lymphocyst if lymphadenectomy performed, unforseen complication, possible need for re-exploration, and medical complications such as heart attack, stroke, pneumonia.  If the patient experiences any of these events, she  understands that her hospitalization or recovery may be prolonged and that she may need to take additional medications for a prolonged period. The patient will receive DVT and antibiotic prophylaxis as indicated. She voiced a clear understanding. She had the opportunity to ask questions and informed consent was obtained today. She wishes to proceed.  Given concern for possible endometrial or uterine cancer, will obtain CT chest to rule out concern for metastatic lesions. She does not require preoperative clearance. Her METs are >4.  All preoperative instructions were reviewed. Postoperative expectations were also reviewed. Written handouts were provided to the patient.   A copy of this note was sent to the patient's referring provider.  [name], MD Gynecologic Oncology   Medical Decision Making I personally spent  TOTAL 60 minutes face-to-face and non-face-to-face in the care of this patient, which includes all pre, intra, and post visit time on the date of service.   ------------  CC: Postmenopausal bleeding, endometrial thickening  HISTORY OF PRESENT ILLNESS:  [name] is a 84 y.o. woman who is seen in consultation at the request of Malachi Motes, MD for evaluation of postmenopausal bleeding, endometrial thickening.  Patient was seen by GI in December for generalized abdominal pain intermittently.  She had recently undergone a colposcopy which demonstrated a couple of adenomas with no future colonoscopy recommended.  A CT scan was ordered.  She underwent CT abdomen/pelvis on 08/02/2024 which noted thickening of the endometrium measuring 7 cm with abnormal enhancing nodular soft tissue along the posterior aspect measuring 2.3 x 5.4 x 4.1 cm concerning for endometrial carcinoma.  The uterus was noted to also be markedly enlarged with several fibroids measuring 14.4 x 18.3 x 17.4 cm.  She was also noted to have cholelithiasis  Patient was then seen by OB/GYN on 08/07/2024.   She knew that time that she  began bleeding that day.  She underwent D&C on 08/08/2024.  Final pathology showed fragments of necrotic apparent endometrium, cannot exclude malignancy.  Today patient presents with her daughter.  She reports ongoing dark spotting but no bleeding or spotting before December.  Reports ongoing occasional crampy abdominal pain that last about 15 to 20 minutes and occurs daily.  This has been ongoing for about 1 to 2 months.  Currently her pain is not as bad as it was before.  Also notes about an 18 pound weight loss in the past 6 months which she attributes to changes in her diet by cutting out junk food and carbs for the past 6 months to aid with her arthritic knee pain.  She otherwise denies abdominal bloating, early satiety, change in bowel or bladder habits.   Pt reports that in the setting of her asthma she uses her inhaler 1-2x/day. She reports that she can climb a flight of stairs without chest pain.    PAST MEDICAL HISTORY: Past Medical History:  Diagnosis Date   Acid reflux    Arthritis    right knee pain   Asthma    Chronic kidney disease    Gout    Gout    Hypertension    Shortness of breath     PAST SURGICAL HISTORY: Past Surgical History:  Procedure Laterality Date   COLONOSCOPY N/A 02/22/2024   Procedure: COLONOSCOPY;  Surgeon: Shaaron Lamar HERO, MD;  Location: [facility];  Service: Endoscopy;  Laterality: N/A;  10;45 AM, ASA 2   MASS EXCISION N/A 04/11/2014   Procedure: EXCISION SOFT TISSUE NEOPLASM,  BACK;  Surgeon: Oneil DELENA Budge, MD;  Location: [facility];  Service: General;  Laterality: N/A;   TONSILLECTOMY     TUBAL LIGATION      OB/GYN HISTORY: OB History  Gravida Para Term Preterm AB Living  5 5 5   5   SAB IAB Ectopic Multiple Live Births      5    # Outcome Date GA Lbr Len/2nd Weight Sex Type Anes PTL Lv  5 Term      Vag-Spont   LIV  4 Term      Vag-Spont   LIV  3 Term      Vag-Spont   LIV  2 Term      Vag-Spont   LIV  1 Term       Vag-Spont   LIV      Age at menarche: unknown Age at menopause: 19 Hx of HRT: no Hx of STI: no Last pap: unknown History of abnormal pap smears: none  SCREENING STUDIES:  Last mammogram: 06/2024 Last colonoscopy: 2025  MEDICATIONS: Current Medications[1]  ALLERGIES: Allergies[2]  FAMILY HISTORY: Family History  Problem Relation Age of Onset   Stomach cancer Mother    Diabetes Father    Breast cancer Niece 23 - 64   BRCA 1/2 Neg Hx    Colon cancer Neg Hx    Colon polyps Neg Hx    Ovarian cancer Neg Hx    Endometrial cancer Neg Hx     SOCIAL HISTORY: Social History   Socioeconomic History   Marital status: Widowed    Spouse name: Not on file   Number of children: Not on file   Years of education: Not on file   Highest education level: Not on file  Occupational History   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 [place]    Comment: CNA  Tobacco Use  Smoking status: Never   Smokeless tobacco: Never  Vaping Use   Vaping status: Never Used  Substance and Sexual Activity   Alcohol  use: No   Drug use: No   Sexual activity: Yes    Birth control/protection: Post-menopausal  Other Topics Concern   Not on file  Social History Narrative   Not on file   Social Drivers of Health   Tobacco Use: Low Risk (08/07/2024)   Received from [facility]   Patient History    Smoking Tobacco Use: Never    Smokeless Tobacco Use: Never    Passive Exposure: Not on file  Financial Resource Strain: Low Risk (08/07/2024)   Received from [facility]   Overall Financial Resource Strain (CARDIA)    How hard is it for you to pay for the very basics like food, housing, medical care, and heating?: Not hard at all  Food Insecurity: No Food Insecurity (08/07/2024)   Received from [facility]   Epic    Within the past 12 months, you worried that your food would run out before you got the money to buy more.: Never true    Within the past 12 months, the  food you bought just didn't last and you didn't have money to get more.: Never true  Transportation Needs: No Transportation Needs (08/07/2024)   Received from [facility] - Transportation    Lack of Transportation (Medical): No    Lack of Transportation (Non-Medical): No  Physical Activity: Not on file  Stress: Not on file  Social Connections: Not on file  Intimate Partner Violence: Not At Risk (08/07/2024)   Received from [facility]   Epic    Within the last year, have you been afraid of your partner or ex-partner?: No    Within the last year, have you been humiliated or emotionally abused in other ways by your partner or ex-partner?: No    Within the last year, have you been kicked, hit, slapped, or otherwise physically hurt by your partner or ex-partner?: No    Within the last year, have you been raped or forced to have any kind of sexual activity by your partner or ex-partner?: No  Depression (PHQ2-9): Not on file  Alcohol  Screen: Not on file  Housing: Not on file  Utilities: Low Risk (08/07/2024)   Received from [facility]   Utilities    Within the past 12 months, have you been unable to get utilities(heat, electricity) when it was really needed?: No  Health Literacy: Not on file    REVIEW OF SYSTEMS: New patient intake form was reviewed.  Complete 10-system review is negative except for the following: Joint pain, occasional headache, bleeding, occasional wheezing, numbness with standing a long time in her low back, occasional dizziness when she bends over, cough, leg swelling, hot flashes  PHYSICAL EXAM: BP (!) 153/77 (BP Location: Left Arm, Patient Position: Sitting)   Pulse 98   Temp 98.2 F (36.8 C) (Oral)   Resp 19   Ht 5' 2 (1.575 m)   Wt 202 lb 9.6 oz (91.9 kg)   SpO2 100%   BMI 37.06 kg/m  Constitutional: No acute distress. Neuro/Psych: Alert, oriented.  Head and Neck: Normocephalic, atraumatic. Neck symmetric without masses. Sclera  anicteric.  Respiratory: Normal work of breathing. Clear to auscultation bilaterally. Cardiovascular: Regular rate and rhythm, no murmurs, rubs, or gallops. Abdomen: Normoactive bowel sounds. Soft, non-distended, non-tender to palpation. No masses appreciated. Uterus  palpated to around the level of the umbilicus. Extremities: Grossly normal range of motion. Warm, well perfused. No edema bilaterally. Skin: No rashes or lesions. Lymphatic: No cervical, supraclavicular, or inguinal adenopathy. Genitourinary: External genitalia without lesions. Urethral meatus without lesions or prolapse. On speculum exam, vagina and cervix without lesions. Bimanual exam reveals normal cervix, enlarged uterus to approximately 1cm above the umbilicus, mobilizes off of the bilateral pelvic walls. Exam chaperoned by Eleanor Epps, NP  EMB Procedure: After appropriate verbal informed consent was obtained, a timeout was performed. A sterile speculum was placed in the vagina, and the area was cleaned with betadine  x3. A single-tooth tenaculum was placed on the anterior lip of the cervix. The os finder was used to dilate the cervix. A tenaculum was then placed on the posterior lip of the cervix to straighten the endocervical canal. An endometrial biopsy pipelle was advanced carefully to the uterine fundus which sounded to >10cm. An abundant sample was obtained over 2 passes. The tenaculum was removed, and tenaculum sites were noted to be hemostatic. The speculum was removed from the vagina. The patient tolerated the procedure well.   UPT: not indicated   LABORATORY AND RADIOLOGIC DATA: Outside medical records were reviewed to synthesize the above history, along with the history and physical obtained during the visit.  Outside laboratory, pathology, and imaging reports were reviewed, with pertinent results below.  I personally reviewed the outside images.  WBC  Date Value Ref Range Status  03/22/2021 5.1 3.4 - 10.8 x10E3/uL  Final  10/30/2020 4.7 3.8 - 10.8 Thousand/uL Final   Hemoglobin  Date Value Ref Range Status  03/22/2021 13.3 11.1 - 15.9 g/dL Final   Hematocrit  Date Value Ref Range Status  03/22/2021 39.3 34.0 - 46.6 % Final   Platelets  Date Value Ref Range Status  03/22/2021 283 150 - 450 x10E3/uL Final   Magnesium  Date Value Ref Range Status  05/17/2019 1.6 1.5 - 2.5 mg/dL Final   Creat  Date Value Ref Range Status  10/30/2020 1.10 (H) 0.60 - 0.93 mg/dL Final    Comment:    For patients >53 years of age, the reference limit for Creatinine is approximately 13% higher for people identified as African-American. .    Creatinine, Ser  Date Value Ref Range Status  07/30/2024 1.36 (H) 0.57 - 1.00 mg/dL Final   AST  Date Value Ref Range Status  03/22/2021 40 0 - 40 IU/L Final   ALT  Date Value Ref Range Status  03/22/2021 14 0 - 32 IU/L Final   Surgical pathology (08/08/24): Specimen 1: Endometrium, biopsy   - FRAGMENTS OF NECROTIC APPARENT ENDOMETRIUM, CANNOT EXCLUDE EIN/MALIGNANCY.   SEE COMMENT.   - FRAGMENTS OF BENIGN ATROPHIC SQUAMOUS MUCOSA.   - EXTENSIVE BACKGROUND HEMORRHAGE.    CT ABDOMEN PELVIS W CONTRAST 08/02/2024  Narrative CLINICAL DATA:  abdominal mass  EXAM: CT ABDOMEN AND PELVIS WITH CONTRAST  TECHNIQUE: Multidetector CT imaging of the abdomen and pelvis was performed using the standard protocol following bolus administration of intravenous contrast.  RADIATION DOSE REDUCTION: This exam was performed according to the departmental dose-optimization program which includes automated exposure control, adjustment of the mA and/or kV according to patient size and/or use of iterative reconstruction technique.  CONTRAST:  80mL OMNIPAQUE  IOHEXOL  300 MG/ML  SOLN  COMPARISON:  None available.  FINDINGS: Lower chest: No focal airspace consolidation or pleural effusion. 3 mm right lower lobe nodule centrally (axial 7). Subpleural nodule in the left lower  lobe (  axial 6), measuring 3 mm. Mild cardiomegaly.  Hepatobiliary: No mass.Gallbladder is full of innumerable small stones. No wall thickening. No intrahepatic or extrahepatic biliary ductal dilation. The portal veins are patent.  Pancreas: No mass or main ductal dilation. No peripancreatic inflammation or fluid collection.  Spleen: In the medial splenic dome, there is a small 7 mm hypodensity (axial 13), which could represent a cyst or hemangioma.  Adrenals/Urinary Tract: No adrenal masses. A couple of small cysts are noted in both kidneys. No nephrolithiasis or hydronephrosis. Partially distended urinary bladder without visualized abnormality.  Stomach/Bowel: The stomach is decompressed without focal abnormality. No small bowel wall thickening or inflammation. No small bowel obstruction.Normal appendix. Total colonic diverticulosis. No changes of acute diverticulitis.  Vascular/Lymphatic: No aortic aneurysm. No intraabdominal or pelvic lymphadenopathy.  Reproductive: Marked enlargement of the uterus, which measures 14.4 x 18.3 x 17.4 cm. Multiple calcified uterine fibroids are present. There is severe thickening of the endometrium, which measures 7 cm in AP dimension. Abnormal, enhancing, nodular soft tissue along the posterior aspect of the endometrium (axial 39-47), measures 2.3 x 5.4 x 4.1 cm.No free pelvic fluid.  Other: No pneumoperitoneum, ascites, or mesenteric inflammation.  Musculoskeletal: No acute fracture or destructive lesion. Multilevel degenerative disc disease of the spine. Advanced facet arthropathy in the lower lumbar spine. Mild grade 1 anterolisthesis of L4 on L5. Mild bilateral hip osteoarthritis.  IMPRESSION: 1. Severe thickening of the endometrium, which measures 7 cm in AP dimension, with abnormally enhancing, nodular soft tissue along the posterior aspect, measuring 2.3 x 5.4 x 4.1 cm. This is especially concerning for endometrial carcinoma.  Gynecologic consultation for further management recommended. 2. Markedly enlarged uterus containing multiple involuted calcified fibroids. Overall, the uterus measures 14.4 x 18.3 x 17.4 cm. 3. Total colonic diverticulosis. No changes of acute diverticulitis. 4. Cholecystolithiasis.  These results will be called to the ordering clinician or representative by the Radiologist Assistant and communication documented in the PACS or [employer].  Aortic Atherosclerosis (ICD10-I70.0).   Electronically Signed By: Rogelia Myers M.D. On: 08/02/2024 15:54     [1]  Current Outpatient Medications:    albuterol  (VENTOLIN  HFA) 108 (90 Base) MCG/ACT inhaler, INHALE TWO PUFFS INTO LUNGS EVERY SIX HOURS AS NEEDED FOR WHEEZING OR SHORTNESS OF BREATH., Disp: 18 g, Rfl: 1   allopurinol  (ZYLOPRIM ) 100 MG tablet, Take 2 tablets (200 mg total) by mouth daily., Disp: 180 tablet, Rfl: 2   amLODipine  (NORVASC ) 10 MG tablet, Take 1 tablet (10 mg total) by mouth daily., Disp: 90 tablet, Rfl: 1   aspirin EC 81 MG tablet, Take 81 mg by mouth daily., Disp: , Rfl:    atorvastatin  (LIPITOR) 10 MG tablet, Take 1 tablet (10 mg total) by mouth daily., Disp: 90 tablet, Rfl: 2   diclofenac  sodium (VOLTAREN ) 1 % GEL, Apply 2 g topically 4 (four) times daily. Pain, Disp: 100 g, Rfl: 3   fluticasone  (FLONASE ) 50 MCG/ACT nasal spray, Place 2 sprays into both nostrils daily., Disp: 16 g, Rfl: 6   loratadine  (CLARITIN ) 10 MG tablet, Take 1 tablet (10 mg total) by mouth daily as needed for allergies., Disp: 30 tablet, Rfl: 11   pantoprazole  (PROTONIX ) 40 MG tablet, Take 1 tablet (40 mg total) by mouth daily. 30 minutes before breakfast, Disp: 30 tablet, Rfl: 11   polyethylene glycol powder (GLYCOLAX /MIRALAX ) 17 GM/SCOOP powder, Take 17 g by mouth daily as needed., Disp: 3350 g, Rfl: 1   triamterene -hydrochlorothiazide (MAXZIDE-25) 37.5-25 MG tablet, Take 1 tablet by  mouth once daily, Disp: 90 tablet, Rfl: 0   VITAMIN D   PO, Take by mouth daily., Disp: , Rfl:  [2]  Allergies Allergen Reactions   Codeine Other (See Comments)    Pt states I feel like I am going to pass out.

## 2024-09-10 ENCOUNTER — Telehealth: Payer: Self-pay | Admitting: *Deleted

## 2024-09-10 NOTE — Telephone Encounter (Signed)
 Per Dr Eldonna fax surgical optimization form to the patient's PCP office (Dr Benjamine [phone])

## 2024-09-11 ENCOUNTER — Ambulatory Visit (HOSPITAL_COMMUNITY)
Admission: RE | Admit: 2024-09-11 | Discharge: 2024-09-11 | Disposition: A | Discharge status: Home / Self Care | Source: Ambulatory Visit | Attending: Psychiatry | Admitting: Psychiatry

## 2024-09-11 DIAGNOSIS — N950 Postmenopausal bleeding: Secondary | ICD-10-CM | POA: Insufficient documentation

## 2024-09-11 DIAGNOSIS — C541 Malignant neoplasm of endometrium: Secondary | ICD-10-CM | POA: Insufficient documentation

## 2024-09-11 DIAGNOSIS — R9389 Abnormal findings on diagnostic imaging of other specified body structures: Secondary | ICD-10-CM | POA: Insufficient documentation

## 2024-09-11 LAB — SURGICAL PATHOLOGY

## 2024-09-11 NOTE — Patient Instructions (Signed)
 SURGICAL WAITING ROOM VISITATION Patients having surgery or a procedure may have no more than 2 support people in the waiting area - these visitors may rotate in the visitor waiting room.   Due to an increase in RSV and influenza rates and associated hospitalizations, children ages 80 and under may not visit patients in [facility] hospitals. If the patient needs to stay at the hospital during part of their recovery, the visitor guidelines for inpatient rooms apply.  PRE-OP VISITATION  Pre-op nurse will coordinate an appropriate time for 1 support person to accompany the patient in pre-op.  This support person may not rotate.  This visitor will be contacted when the time is appropriate for the visitor to come back in the pre-op area.  Please refer to the [facility] website for the visitor guidelines for Inpatients (after your surgery is over and you are in a regular room).  You are not required to quarantine at this time prior to your surgery. However, you must do this: Hand Hygiene often Do NOT share personal items Notify your provider if you are in close contact with someone who has COVID or you develop fever 100.4 or greater, new onset of sneezing, cough, sore throat, shortness of breath or body aches.  If you test positive for Covid or have been in contact with anyone that has tested positive in the last 10 days please notify you surgeon.    Your procedure is scheduled on:  09/17/24  Report to [facility] Main Entrance: [place] entrance where the [employer] is available.   Report to admitting at: 9:30 AM  Call this number if you have any questions or problems the morning of surgery [phone]  FOLLOW ANY ADDITIONAL PRE OP INSTRUCTIONS YOU RECEIVED FROM YOUR SURGEON'S OFFICE!!!  Eat a light diet the day before surgery.  Examples including soups, broths, toast, yogurt, mashed potatoes.  Things to avoid include carbonated beverages (fizzy beverages), raw fruits and raw  vegetables, or beans.   If your bowels are filled with gas, your surgeon will have difficulty visualizing your pelvic organs which increases your surgical risks.  Do not eat food after Midnight the night prior to your surgery/procedure.  After Midnight you may have the following liquids until: 8:45 AM DAY OF SURGERY  Clear Liquid Diet Water Black Coffee (sugar ok, NO MILK/CREAM OR CREAMERS)  Tea (sugar ok, NO MILK/CREAM OR CREAMERS) regular and decaf                             Plain Jell-O  with no fruit (NO RED)                                           Fruit ices (not with fruit pulp, NO RED)                                     Popsicles (NO RED)  Juice: NO CITRUS JUICES: only apple, WHITE grape, WHITE cranberry Sports drinks like Gatorade or Powerade (NO RED)   Oral Hygiene is also important to reduce your risk of infection.        Remember - BRUSH YOUR TEETH THE MORNING OF SURGERY WITH YOUR REGULAR TOOTHPASTE  Do NOT smoke after Midnight the night before surgery.  STOP TAKING all Vitamins, Herbs and supplements 1 week before your surgery.   Take ONLY these medicines the morning of surgery with A SIP OF WATER: allopurinol ,loratadine .Use inhalers as usual and bring them.Tylenol  as needed.                You may not have any metal on your body including hair pins, jewelry, and body piercing  Do not wear make-up, lotions, powders, perfumes / cologne, or deodorant  Do not wear nail polish including gel and S&S, artificial / acrylic nails, or any other type of covering on natural nails including finger and toenails. If you have artificial nails, gel coating, etc., that needs to be removed by a nail salon, Please have this removed prior to surgery. Not doing so may mean that your surgery could be cancelled or delayed if the Surgeon or anesthesia staff feels like they are unable to monitor you safely.   Do not shave 48  hours prior to surgery to avoid nicks in your skin which may contribute to postoperative infections.   Contacts, Hearing Aids, dentures or bridgework may not be worn into surgery. DENTURES WILL BE REMOVED PRIOR TO SURGERY PLEASE DO NOT APPLY Poly grip OR ADHESIVES!!!  You may bring a small overnight bag with you on the day of surgery, only pack items that are not valuable. [facility] IS NOT RESPONSIBLE   FOR VALUABLES THAT ARE LOST OR STOLEN.   Patients discharged on the day of surgery will not be allowed to drive home.  Someone NEEDS to stay with you for the first 24 hours after anesthesia.  Do not bring your home medications to the hospital. The Pharmacy will dispense medications listed on your medication list to you during your admission in the Hospital.  Special Instructions: Bring a copy of your healthcare power of attorney and living will documents the day of surgery, if you wish to have them scanned into your [facility] Records- EPIC  Please read over the following fact sheets you were given: IF YOU HAVE QUESTIONS ABOUT YOUR PRE-OP INSTRUCTIONS, PLEASE CALL [phone]   [facility] - Preparing for Surgery      Before surgery, you can play an important role.  Because skin is not sterile, your skin needs to be as free of germs as possible.  You can reduce the number of germs on your skin by washing with CHG (chlorahexidine gluconate) soap before surgery.  CHG is an antiseptic cleaner which kills germs and bonds with the skin to continue killing germs even after washing. Please DO NOT use if you have an allergy to CHG or antibacterial soaps.  If your skin becomes reddened/irritated stop using the CHG and inform your nurse when you arrive at Short Stay. Do not shave (including legs and underarms) for at least 48 hours prior to the first CHG shower.  You may shave your face/neck.  Please follow these instructions carefully:  1.  Shower with CHG Soap the night before surgery ONLY  (DO NOT USE THE SOAP THE MORNING OF SURGERY).  2.  If you choose to wash your hair, wash your hair first  as usual with your normal  shampoo.  3.  After you shampoo, rinse your hair and body thoroughly to remove the shampoo.                             4.  Use CHG as you would any other liquid soap.  You can apply chg directly to the skin and wash.  Gently with a scrungie or clean washcloth.  5.  Apply the CHG Soap to your body ONLY FROM THE NECK DOWN.   Do not use on face/ open                           Wound or open sores. Avoid contact with eyes, ears mouth and genitals (private parts).                       Wash face,  Genitals (private parts) with your normal soap.             6.  Wash thoroughly, paying special attention to the area where your  surgery  will be performed.  7.  Thoroughly rinse your body with warm water  from the neck down.  8.  DO NOT shower/wash with your normal soap after using and rinsing off the CHG Soap.                9.  Pat yourself dry with a clean towel.            10.  Wear clean pajamas.            11.  Place clean sheets on your bed the night of your first shower and do not  sleep with pets.  Day of Surgery : Do not apply any CHG, lotions/deodorants the morning of surgery.  Please wear clean clothes to the hospital/surgery center.   FAILURE TO FOLLOW THESE INSTRUCTIONS MAY RESULT IN THE CANCELLATION OF YOUR SURGERY  PATIENT SIGNATURE_________________________________  NURSE SIGNATURE__________________________________  ________________________________________________________________________ WHAT IS A BLOOD TRANSFUSION? Blood Transfusion Information  A transfusion is the replacement of blood or some of its parts. Blood is made up of multiple cells which provide different functions. Red blood cells carry oxygen and are used for blood loss replacement. White blood cells fight against infection. Platelets control bleeding. Plasma helps clot blood. Other  blood products are available for specialized needs, such as hemophilia or other clotting disorders. BEFORE THE TRANSFUSION  Who gives blood for transfusions?  Healthy volunteers who are fully evaluated to make sure their blood is safe. This is blood bank blood. Transfusion therapy is the safest it has ever been in the practice of medicine. Before blood is taken from a donor, a complete history is taken to make sure that person has no history of diseases nor engages in risky social behavior (examples are intravenous drug use or sexual activity with multiple partners). The donor's travel history is screened to minimize risk of transmitting infections, such as malaria. The donated blood is tested for signs of infectious diseases, such as HIV and hepatitis. The blood is then tested to be sure it is compatible with you in order to minimize the chance of a transfusion reaction. If you or a relative donates blood, this is often done in anticipation of surgery and is not appropriate for emergency situations. It takes many days to process the donated blood. RISKS AND COMPLICATIONS Although transfusion  therapy is very safe and saves many lives, the main dangers of transfusion include:  Getting an infectious disease. Developing a transfusion reaction. This is an allergic reaction to something in the blood you were given. Every precaution is taken to prevent this. The decision to have a blood transfusion has been considered carefully by your caregiver before blood is given. Blood is not given unless the benefits outweigh the risks. AFTER THE TRANSFUSION Right after receiving a blood transfusion, you will usually feel much better and more energetic. This is especially true if your red blood cells have gotten low (anemic). The transfusion raises the level of the red blood cells which carry oxygen, and this usually causes an energy increase. The nurse administering the transfusion will monitor you carefully for  complications. HOME CARE INSTRUCTIONS  No special instructions are needed after a transfusion. You may find your energy is better. Speak with your caregiver about any limitations on activity for underlying diseases you may have. SEEK MEDICAL CARE IF:  Your condition is not improving after your transfusion. You develop redness or irritation at the intravenous (IV) site. SEEK IMMEDIATE MEDICAL CARE IF:  Any of the following symptoms occur over the next 12 hours: Shaking chills. You have a temperature by mouth above 102 F (38.9 C), not controlled by medicine. Chest, back, or muscle pain. People around you feel you are not acting correctly or are confused. Shortness of breath or difficulty breathing. Dizziness and fainting. You get a rash or develop hives. You have a decrease in urine output. Your urine turns a dark color or changes to pink, red, or brown. Any of the following symptoms occur over the next 10 days: You have a temperature by mouth above 102 F (38.9 C), not controlled by medicine. Shortness of breath. Weakness after normal activity. The white part of the eye turns yellow (jaundice). You have a decrease in the amount of urine or are urinating less often. Your urine turns a dark color or changes to pink, red, or brown. Document Released: 08/12/2000 Document Revised: 11/07/2011 Document Reviewed: 03/31/2008 [facility] Patient Information 2014 [place], [place].  _______________________________________________________________________

## 2024-09-12 ENCOUNTER — Encounter (HOSPITAL_COMMUNITY)
Admission: RE | Admit: 2024-09-12 | Discharge: 2024-09-12 | Disposition: A | Discharge status: Home / Self Care | Source: Ambulatory Visit | Attending: Psychiatry | Admitting: Psychiatry

## 2024-09-12 ENCOUNTER — Encounter (HOSPITAL_COMMUNITY): Payer: Self-pay

## 2024-09-12 ENCOUNTER — Other Ambulatory Visit: Payer: Self-pay

## 2024-09-12 VITALS — BP 138/81 | HR 86 | Temp 99.6°F | Ht 62.0 in | Wt 201.0 lb

## 2024-09-12 DIAGNOSIS — N950 Postmenopausal bleeding: Secondary | ICD-10-CM | POA: Diagnosis not present

## 2024-09-12 DIAGNOSIS — K5730 Diverticulosis of large intestine without perforation or abscess without bleeding: Secondary | ICD-10-CM | POA: Diagnosis not present

## 2024-09-12 DIAGNOSIS — J45909 Unspecified asthma, uncomplicated: Secondary | ICD-10-CM | POA: Diagnosis not present

## 2024-09-12 DIAGNOSIS — K8020 Calculus of gallbladder without cholecystitis without obstruction: Secondary | ICD-10-CM | POA: Insufficient documentation

## 2024-09-12 DIAGNOSIS — I5030 Unspecified diastolic (congestive) heart failure: Secondary | ICD-10-CM | POA: Insufficient documentation

## 2024-09-12 DIAGNOSIS — R9389 Abnormal findings on diagnostic imaging of other specified body structures: Secondary | ICD-10-CM | POA: Insufficient documentation

## 2024-09-12 DIAGNOSIS — I10 Essential (primary) hypertension: Secondary | ICD-10-CM

## 2024-09-12 DIAGNOSIS — Z01818 Encounter for other preprocedural examination: Secondary | ICD-10-CM | POA: Insufficient documentation

## 2024-09-12 DIAGNOSIS — M47814 Spondylosis without myelopathy or radiculopathy, thoracic region: Secondary | ICD-10-CM | POA: Insufficient documentation

## 2024-09-12 DIAGNOSIS — I700 Atherosclerosis of aorta: Secondary | ICD-10-CM | POA: Diagnosis not present

## 2024-09-12 DIAGNOSIS — R918 Other nonspecific abnormal finding of lung field: Secondary | ICD-10-CM | POA: Insufficient documentation

## 2024-09-12 DIAGNOSIS — I110 Hypertensive heart disease with heart failure: Secondary | ICD-10-CM | POA: Diagnosis not present

## 2024-09-12 LAB — CBC
HCT: 41.3 % (ref 36.0–46.0)
Hemoglobin: 13.5 g/dL (ref 12.0–15.0)
MCH: 33.6 pg (ref 26.0–34.0)
MCHC: 32.7 g/dL (ref 30.0–36.0)
MCV: 102.7 fL — ABNORMAL HIGH (ref 80.0–100.0)
Platelets: 266 K/uL (ref 150–400)
RBC: 4.02 MIL/uL (ref 3.87–5.11)
RDW: 12.4 % (ref 11.5–15.5)
WBC: 6.6 K/uL (ref 4.0–10.5)
nRBC: 0 % (ref 0.0–0.2)

## 2024-09-12 LAB — COMPREHENSIVE METABOLIC PANEL WITH GFR
ALT: 9 U/L (ref 0–44)
AST: 40 U/L (ref 15–41)
Albumin: 4 g/dL (ref 3.5–5.0)
Alkaline Phosphatase: 76 U/L (ref 38–126)
Anion gap: 10 (ref 5–15)
BUN: 22 mg/dL (ref 8–23)
CO2: 25 mmol/L (ref 22–32)
Calcium: 10.6 mg/dL — ABNORMAL HIGH (ref 8.9–10.3)
Chloride: 107 mmol/L (ref 98–111)
Creatinine, Ser: 1.26 mg/dL — ABNORMAL HIGH (ref 0.44–1.00)
GFR, Estimated: 43 mL/min — ABNORMAL LOW
Glucose, Bld: 87 mg/dL (ref 70–99)
Potassium: 4.2 mmol/L (ref 3.5–5.1)
Sodium: 141 mmol/L (ref 135–145)
Total Bilirubin: 0.6 mg/dL (ref 0.0–1.2)
Total Protein: 8.2 g/dL — ABNORMAL HIGH (ref 6.5–8.1)

## 2024-09-12 NOTE — Progress Notes (Signed)
 For Anesthesia: PCP - Benjamine Aland, MD  Cardiologist - N/A  Bowel Prep reminder:  Chest x-ray -  EKG - 09/13/24 Stress Test -  ECHO -  Cardiac Cath -  Pacemaker/ICD device last checked: Pacemaker orders received: Device Rep notified:  Spinal Cord Stimulator:N/A  Sleep Study - N/A CPAP -   Fasting Blood Sugar - N/A Checks Blood Sugar _____ times a day Date and result of last Hgb A1c-  Last dose of GLP1 agonist- N/A GLP1 instructions: Hold 7 days prior to schedule (Hold 24 hours-daily)   Last dose of SGLT-2 inhibitors- Jardiance :As per MD. Instructions it will be on hold 3 days before surgery: after: 09/13/24 SGLT-2 instructions: Hold 72 hours prior to surgery  Blood Thinner Instructions:N/A Last Dose: Time last taken:  Aspirin Instructions: Last Dose: Time last taken:  Activity level: Can go up a flight of stairs and activities of daily living without stopping and without chest pain and/or shortness of breath   Able to exercise without chest pain and/or shortness of breath  Anesthesia review: HTN,CKD 3  Patient denies shortness of breath, fever, cough and chest pain at PAT appointment   Patient verbalized understanding of instructions that were reviewed over the telephone.

## 2024-09-13 ENCOUNTER — Inpatient Hospital Stay: Admitting: Gynecologic Oncology

## 2024-09-16 ENCOUNTER — Encounter: Payer: Self-pay | Admitting: Psychiatry

## 2024-09-16 ENCOUNTER — Ambulatory Visit: Payer: Self-pay | Admitting: Psychiatry

## 2024-09-16 ENCOUNTER — Telehealth: Payer: Self-pay | Admitting: *Deleted

## 2024-09-16 NOTE — Telephone Encounter (Signed)
Telephone call to check on pre-operative status.  Patient compliant with pre-operative instructions.  Reinforced nothing to eat after midnight. Clear liquids until 0830. Patient to arrive at 0930.  No questions or concerns voiced.  Instructed to call for any needs.  ?

## 2024-09-16 NOTE — Progress Notes (Addendum)
 " Case: [id number] Date/Time: 09/17/24 1014   Procedures:      HYSTERECTOMY, TOTAL, ROBOT-ASSISTED, LAPAROSCOPIC, WITH BILATERAL SALPINGO-OOPHORECTOMY - possible laparotomy     INJECTION, FOR SENTINEL LYMPH NODE IDENTIFICATION     LYMPH NODE BIOPSY     LYMPHADENECTOMY, PELVIS, ROBOT-ASSISTED   Anesthesia type: General   Diagnosis:      Thickened endometrium [R93.89]     Post-menopausal bleeding [N95.0]   Pre-op diagnosis: thickened endometrium, post menopausal bleeding, suspicion for malignancy   Location[facility] ROOM 05 / [facility]   Surgeons: Eldonna Mays, MD       DISCUSSION: [name] is an 84 yo female with PMH of HTN, HFpEF, asthma, GERD, arthritis, possible endometrial cancer.  Patient admitted at [facility] for vaginal bleeding. She underwent an echo as part of pre op w/u which showed normal LVEF and grade I DD. She underwent endometrial biopsy with D&C on 08/08/24 under general anesthesia which was without complications. Biopsy results indeterminate however due to age, symptoms, and imaging findings now scheduled for surgery above.  Hx of asthma, on inhalers. No recent exacerbations.  Hx of CKD3 which is stable on pre op labs  VS: BP 138/81   Pulse 86   Temp 37.6 C (Oral)   Ht 5' 2 (1.575 m)   Wt 91.2 kg   SpO2 97%   BMI 36.76 kg/m   PROVIDERS: Benjamine Aland, MD   LABS: Labs reviewed: Acceptable for surgery. (all labs ordered are listed, but only abnormal results are displayed)  Labs Reviewed  CBC - Abnormal; Notable for the following components:      Result Value   MCV 102.7 (*)    All other components within normal limits  COMPREHENSIVE METABOLIC PANEL WITH GFR - Abnormal; Notable for the following components:   Creatinine, Ser 1.26 (*)    Calcium  10.6 (*)    Total Protein 8.2 (*)    GFR, Estimated 43 (*)    All other components within normal limits  TYPE AND SCREEN     CT Chest 09/11/24:  IMPRESSION: 1. Small nodules measuring up to 0.4 cm  in diameter are probably benign but may merit surveillance. Presuming history of malignancy or active malignancy, Fleischner society criteria do not apply. 2. Left lower lobe subsegmental atelectasis or scarring. 3. Cholelithiasis. 4. Atherosclerotic calcification of the aortic arch and upper abdominal aorta. 5. Mild thoracic spondylosis and bilateral glenohumeral degenerative Arthropathy  CT Abdomen/Pelvis 08/02/24:  IMPRESSION: 1. Severe thickening of the endometrium, which measures 7 cm in AP dimension, with abnormally enhancing, nodular soft tissue along the posterior aspect, measuring 2.3 x 5.4 x 4.1 cm. This is especially concerning for endometrial carcinoma. Gynecologic consultation for further management recommended. 2. Markedly enlarged uterus containing multiple involuted calcified fibroids. Overall, the uterus measures 14.4 x 18.3 x 17.4 cm. 3. Total colonic diverticulosis. No changes of acute diverticulitis. 4. Cholecystolithiasis.   These results will be called to the ordering clinician or representative by the Radiologist Assistant and communication documented in the PACS or [employer].   EKG 09/12/24:  NSR LAD RBBB LVH   Echo 08/08/24 [facility])  Summary   1. The left ventricle is normal in size with mildly to moderately increased wall thickness.   2. The left ventricular systolic function is normal, LVEF is visually estimated at 65-70%.   3. There is grade I diastolic dysfunction (impaired relaxation).   4. The right ventricle is normal in size, with normal systolic function.  Past Medical History:  Diagnosis Date   Acid reflux    Arthritis    right knee pain   Asthma    Chronic kidney disease    Gout    Gout    Hypertension    Shortness of breath     Past Surgical History:  Procedure Laterality Date   COLONOSCOPY N/A 02/22/2024   Procedure: COLONOSCOPY;  Surgeon: Shaaron Lamar HERO, MD;  Location: [facility] SUITE;  Service: Endoscopy;  Laterality:  N/A;  10;45 AM, ASA 2   MASS EXCISION N/A 04/11/2014   Procedure: EXCISION SOFT TISSUE NEOPLASM,  BACK;  Surgeon: Oneil DELENA Budge, MD;  Location: [facility];  Service: General;  Laterality: N/A;   TONSILLECTOMY     TUBAL LIGATION      MEDICATIONS:  acetaminophen  (TYLENOL ) 500 MG tablet   allopurinol  (ZYLOPRIM ) 100 MG tablet   amLODipine  (NORVASC ) 10 MG tablet   Ascorbic Acid (VITAMIN C PO)   aspirin EC 81 MG tablet   atorvastatin  (LIPITOR) 10 MG tablet   budesonide -glycopyrrolate -formoterol  (BREZTRI  AEROSPHERE) 160-9-4.8 MCG/ACT AERO inhaler   diclofenac  sodium (VOLTAREN ) 1 % GEL   fluticasone  (FLONASE ) 50 MCG/ACT nasal spray   JARDIANCE 10 MG TABS tablet   loratadine  (CLARITIN ) 10 MG tablet   pantoprazole  (PROTONIX ) 40 MG tablet   polyethylene glycol powder (GLYCOLAX /MIRALAX ) 17 GM/SCOOP powder   senna-docusate (SENOKOT-S) 8.6-50 MG tablet   traMADol  (ULTRAM ) 50 MG tablet   triamterene -hydrochlorothiazide (MAXZIDE-25) 37.5-25 MG tablet   Vitamin D -Vitamin K (VITAMIN K2-VITAMIN D3 PO)   No current facility-administered medications for this encounter.   Burnard HERO Odis DEVONNA [facility] Surgical Short Stay/Anesthesiology [facility] Phone [phone] 09/16/2024 2:59 PM         "

## 2024-09-16 NOTE — Anesthesia Preprocedure Evaluation (Signed)
 "                                  Anesthesia Evaluation  Patient identified by MRN, date of birth, ID band Patient awake    Reviewed: Allergy & Precautions, NPO status , Patient's Chart, lab work & pertinent test results  Airway Mallampati: II  TM Distance: >3 FB Neck ROM: Full    Dental no notable dental hx. (+) Teeth Intact, Dental Advisory Given   Pulmonary asthma , sleep apnea and Continuous Positive Airway Pressure Ventilation    Pulmonary exam normal breath sounds clear to auscultation       Cardiovascular hypertension, Pt. on medications (-) angina (-) Past MI Normal cardiovascular exam+ dysrhythmias Atrial Fibrillation  Rhythm:Regular Rate:Normal     Neuro/Psych  PSYCHIATRIC DISORDERS Anxiety Depression       GI/Hepatic ,GERD  ,,  Endo/Other    Class 3 obesityPt on Zepbound   Renal/GU Renal diseaseLab Results      Component                Value               Date                               K                        4.1                 09/12/2024                   CREATININE               1.21                09/12/2024                     Musculoskeletal  (+) Arthritis ,    Abdominal  (+) + obese  Peds  Hematology Lab Results      Component                Value               Date                      WBC                      6.6                 09/12/2024                HGB                      15.6                09/12/2024                HCT                      44.3                09/12/2024                MCV  92.5                09/12/2024                PLT                      325                 09/12/2024              Anesthesia Other Findings   Reproductive/Obstetrics                              Anesthesia Physical Anesthesia Plan  ASA: 3  Anesthesia Plan: General and Regional   Post-op Pain Management: Regional block*, Precedex and Ofirmev  IV (intra-op)*   Induction:  Intravenous  PONV Risk Score and Plan: 3 and Treatment may vary due to age or medical condition, Midazolam , Dexamethasone  and Ondansetron   Airway Management Planned: Oral ETT and Video Laryngoscope Planned  Additional Equipment: None  Intra-op Plan:   Post-operative Plan: Extubation in OR  Informed Consent: I have reviewed the patients History and Physical, chart, labs and discussed the procedure including the risks, benefits and alternatives for the proposed anesthesia with the patient or authorized representative who has indicated his/her understanding and acceptance.     Dental advisory given  Plan Discussed with: CRNA and Surgeon  Anesthesia Plan Comments: (See PAT note from 1/15  GA w  R ISB Exparel   (lower dose)   Have diltiazem  drip avail rate control afib )         Anesthesia Quick Evaluation  "

## 2024-09-16 NOTE — Telephone Encounter (Signed)
 Spoke with patient and relayed that her surgery time has been changed and she needs to arrive by 8:15 am instead of 9:30 am. Pt verbalized understanding.

## 2024-09-17 ENCOUNTER — Encounter (HOSPITAL_COMMUNITY): Payer: Self-pay | Admitting: Medical

## 2024-09-17 ENCOUNTER — Other Ambulatory Visit: Payer: Self-pay

## 2024-09-17 ENCOUNTER — Encounter (HOSPITAL_COMMUNITY): Admission: RE | Disposition: A | Payer: Self-pay | Source: Home / Self Care | Attending: Psychiatry

## 2024-09-17 ENCOUNTER — Ambulatory Visit (HOSPITAL_COMMUNITY): Admitting: Anesthesiology

## 2024-09-17 ENCOUNTER — Encounter (HOSPITAL_COMMUNITY): Payer: Self-pay | Admitting: Psychiatry

## 2024-09-17 ENCOUNTER — Observation Stay (HOSPITAL_COMMUNITY)
Admission: RE | Admit: 2024-09-17 | Discharge: 2024-09-18 | Disposition: A | Discharge status: Home / Self Care | Attending: Psychiatry | Admitting: Psychiatry

## 2024-09-17 DIAGNOSIS — D250 Submucous leiomyoma of uterus: Secondary | ICD-10-CM | POA: Insufficient documentation

## 2024-09-17 DIAGNOSIS — N1831 Chronic kidney disease, stage 3a: Secondary | ICD-10-CM | POA: Insufficient documentation

## 2024-09-17 DIAGNOSIS — J45909 Unspecified asthma, uncomplicated: Secondary | ICD-10-CM | POA: Diagnosis not present

## 2024-09-17 DIAGNOSIS — C541 Malignant neoplasm of endometrium: Secondary | ICD-10-CM | POA: Diagnosis present

## 2024-09-17 DIAGNOSIS — D251 Intramural leiomyoma of uterus: Secondary | ICD-10-CM | POA: Insufficient documentation

## 2024-09-17 DIAGNOSIS — I10 Essential (primary) hypertension: Secondary | ICD-10-CM

## 2024-09-17 DIAGNOSIS — N83201 Unspecified ovarian cyst, right side: Secondary | ICD-10-CM | POA: Diagnosis not present

## 2024-09-17 DIAGNOSIS — N9489 Other specified conditions associated with female genital organs and menstrual cycle: Secondary | ICD-10-CM | POA: Diagnosis not present

## 2024-09-17 DIAGNOSIS — N950 Postmenopausal bleeding: Secondary | ICD-10-CM

## 2024-09-17 DIAGNOSIS — I129 Hypertensive chronic kidney disease with stage 1 through stage 4 chronic kidney disease, or unspecified chronic kidney disease: Secondary | ICD-10-CM | POA: Diagnosis not present

## 2024-09-17 DIAGNOSIS — Z79899 Other long term (current) drug therapy: Secondary | ICD-10-CM | POA: Diagnosis not present

## 2024-09-17 DIAGNOSIS — N83202 Unspecified ovarian cyst, left side: Secondary | ICD-10-CM | POA: Insufficient documentation

## 2024-09-17 DIAGNOSIS — R9389 Abnormal findings on diagnostic imaging of other specified body structures: Principal | ICD-10-CM

## 2024-09-17 DIAGNOSIS — D252 Subserosal leiomyoma of uterus: Secondary | ICD-10-CM | POA: Diagnosis not present

## 2024-09-17 HISTORY — PX: CYSTOSCOPY: SHX5120

## 2024-09-17 HISTORY — PX: LYMPH NODE BIOPSY: SHX201

## 2024-09-17 HISTORY — PX: ROBOTIC ASSISTED TOTAL HYSTERECTOMY WITH BILATERAL SALPINGO OOPHERECTOMY: SHX6086

## 2024-09-17 LAB — ABO/RH: ABO/RH(D): A POS

## 2024-09-17 LAB — HEMOGLOBIN A1C
Hgb A1c MFr Bld: 4.8 % (ref 4.8–5.6)
Mean Plasma Glucose: 91.06 mg/dL

## 2024-09-17 LAB — TYPE AND SCREEN
ABO/RH(D): A POS
Antibody Screen: NEGATIVE

## 2024-09-17 LAB — GLUCOSE, CAPILLARY: Glucose-Capillary: 167 mg/dL — ABNORMAL HIGH (ref 70–99)

## 2024-09-17 MED ORDER — PROPOFOL 10 MG/ML IV BOLUS
INTRAVENOUS | Status: AC
  Filled 2024-09-17: qty 20

## 2024-09-17 MED ORDER — LACTATED RINGERS IV SOLN: INTRAVENOUS | Status: DC

## 2024-09-17 MED ORDER — SENNOSIDES-DOCUSATE SODIUM 8.6-50 MG PO TABS
2.0000 | ORAL_TABLET | Freq: Every day | ORAL | Status: DC
  Administered 2024-09-17: 2 via ORAL
  Filled 2024-09-17: qty 2

## 2024-09-17 MED ORDER — BUPIVACAINE HCL 0.25 % IJ SOLN
INTRAMUSCULAR | Status: DC | PRN
  Administered 2024-09-17: 30 mL

## 2024-09-17 MED ORDER — ROCURONIUM BROMIDE 10 MG/ML (PF) SYRINGE
PREFILLED_SYRINGE | INTRAVENOUS | Status: AC
  Filled 2024-09-17: qty 10

## 2024-09-17 MED ORDER — HYDROMORPHONE HCL 1 MG/ML IJ SOLN
INTRAMUSCULAR | Status: DC | PRN
  Administered 2024-09-17 (×2): .5 mg via INTRAVENOUS

## 2024-09-17 MED ORDER — SUGAMMADEX SODIUM 200 MG/2ML IV SOLN
INTRAVENOUS | Status: DC | PRN
  Administered 2024-09-17: 200 mg via INTRAVENOUS

## 2024-09-17 MED ORDER — STERILE WATER FOR INJECTION IJ SOLN
INTRAMUSCULAR | Status: DC | PRN
  Administered 2024-09-17: 4 mL

## 2024-09-17 MED ORDER — ONDANSETRON HCL 4 MG/2ML IJ SOLN
INTRAMUSCULAR | Status: AC
  Filled 2024-09-17: qty 2

## 2024-09-17 MED ORDER — LACTATED RINGERS IV SOLN: INTRAVENOUS | Status: DC | PRN

## 2024-09-17 MED ORDER — BUPIVACAINE LIPOSOME 1.3 % IJ SUSP
INTRAMUSCULAR | Status: DC | PRN
  Administered 2024-09-17: 20 mL

## 2024-09-17 MED ORDER — STERILE WATER FOR INJECTION IJ SOLN
INTRAMUSCULAR | Status: AC
  Filled 2024-09-17: qty 10

## 2024-09-17 MED ORDER — ATORVASTATIN CALCIUM 10 MG PO TABS
10.0000 mg | ORAL_TABLET | Freq: Every day | ORAL | Status: DC
  Filled 2024-09-17: qty 1

## 2024-09-17 MED ORDER — LIDOCAINE HCL (PF) 2 % IJ SOLN
INTRAMUSCULAR | Status: AC
  Filled 2024-09-17: qty 5

## 2024-09-17 MED ORDER — ACETAMINOPHEN 500 MG PO TABS
1000.0000 mg | ORAL_TABLET | Freq: Two times a day (BID) | ORAL | Status: DC
  Administered 2024-09-17 – 2024-09-18 (×2): 1000 mg via ORAL
  Filled 2024-09-17 (×2): qty 2

## 2024-09-17 MED ORDER — HEMOSTATIC AGENTS (NO CHARGE) OPTIME
TOPICAL | Status: DC | PRN
  Administered 2024-09-17: 2 via TOPICAL

## 2024-09-17 MED ORDER — ONDANSETRON HCL 4 MG/2ML IJ SOLN
INTRAMUSCULAR | Status: DC | PRN
  Administered 2024-09-17: 4 mg via INTRAVENOUS

## 2024-09-17 MED ORDER — STERILE WATER FOR IRRIGATION IR SOLN
Status: DC | PRN
  Administered 2024-09-17: 1000 mL

## 2024-09-17 MED ORDER — SODIUM CHLORIDE 0.45 % IV SOLN: INTRAVENOUS | Status: DC

## 2024-09-17 MED ORDER — HYDROMORPHONE HCL 1 MG/ML IJ SOLN: 0.2500 mg | INTRAMUSCULAR | Status: DC | PRN

## 2024-09-17 MED ORDER — ORAL CARE MOUTH RINSE: 15.0000 mL | Freq: Once | OROMUCOSAL | Status: AC

## 2024-09-17 MED ORDER — OXYCODONE HCL 5 MG/5ML PO SOLN: 5.0000 mg | Freq: Once | ORAL | Status: DC | PRN

## 2024-09-17 MED ORDER — ACETAMINOPHEN 500 MG PO TABS
1000.0000 mg | ORAL_TABLET | ORAL | Status: DC
  Filled 2024-09-17: qty 2

## 2024-09-17 MED ORDER — BUDESON-GLYCOPYRROL-FORMOTEROL 160-9-4.8 MCG/ACT IN AERO
2.0000 | INHALATION_SPRAY | Freq: Two times a day (BID) | RESPIRATORY_TRACT | Status: DC
  Administered 2024-09-17 – 2024-09-18 (×2): 2 via RESPIRATORY_TRACT
  Filled 2024-09-17: qty 5.9

## 2024-09-17 MED ORDER — INSULIN ASPART 100 UNIT/ML IJ SOLN: 0.0000 [IU] | Freq: Every day | INTRAMUSCULAR | Status: DC

## 2024-09-17 MED ORDER — AMLODIPINE BESYLATE 10 MG PO TABS: 10.0000 mg | ORAL_TABLET | Freq: Every day | ORAL | Status: DC

## 2024-09-17 MED ORDER — ONDANSETRON HCL 4 MG/2ML IJ SOLN: 4.0000 mg | Freq: Four times a day (QID) | INTRAMUSCULAR | Status: DC | PRN

## 2024-09-17 MED ORDER — LACTATED RINGERS IR SOLN
Status: DC | PRN
  Administered 2024-09-17: 1000 mL

## 2024-09-17 MED ORDER — SUGAMMADEX SODIUM 200 MG/2ML IV SOLN
INTRAVENOUS | Status: AC
  Filled 2024-09-17: qty 2

## 2024-09-17 MED ORDER — METRONIDAZOLE 500 MG/100ML IV SOLN
500.0000 mg | INTRAVENOUS | Status: AC
  Administered 2024-09-17: 500 mg via INTRAVENOUS
  Filled 2024-09-17: qty 100

## 2024-09-17 MED ORDER — HYDROMORPHONE HCL 2 MG/ML IJ SOLN
INTRAMUSCULAR | Status: AC
  Filled 2024-09-17: qty 1

## 2024-09-17 MED ORDER — ALLOPURINOL 100 MG PO TABS
200.0000 mg | ORAL_TABLET | Freq: Every day | ORAL | Status: DC
  Administered 2024-09-18: 200 mg via ORAL
  Filled 2024-09-17: qty 2

## 2024-09-17 MED ORDER — TRAMADOL HCL 50 MG PO TABS
100.0000 mg | ORAL_TABLET | Freq: Two times a day (BID) | ORAL | Status: DC | PRN
  Administered 2024-09-18: 100 mg via ORAL
  Filled 2024-09-17: qty 2

## 2024-09-17 MED ORDER — ACETAMINOPHEN 500 MG PO TABS
1000.0000 mg | ORAL_TABLET | Freq: Once | ORAL | Status: AC
  Administered 2024-09-17: 1000 mg via ORAL

## 2024-09-17 MED ORDER — IBUPROFEN 400 MG PO TABS: 600.0000 mg | ORAL_TABLET | Freq: Four times a day (QID) | ORAL | Status: DC

## 2024-09-17 MED ORDER — PROPOFOL 10 MG/ML IV BOLUS
INTRAVENOUS | Status: DC | PRN
  Administered 2024-09-17: 150 mg via INTRAVENOUS

## 2024-09-17 MED ORDER — DEXAMETHASONE SOD PHOSPHATE PF 10 MG/ML IJ SOLN
INTRAMUSCULAR | Status: AC
  Filled 2024-09-17: qty 1

## 2024-09-17 MED ORDER — STERILE WATER FOR INJECTION IJ SOLN
INTRAMUSCULAR | Status: DC | PRN
  Administered 2024-09-17: 10 mL

## 2024-09-17 MED ORDER — CEFAZOLIN SODIUM-DEXTROSE 2-4 GM/100ML-% IV SOLN
2.0000 g | INTRAVENOUS | Status: AC
  Administered 2024-09-17 (×2): 2 g via INTRAVENOUS
  Filled 2024-09-17: qty 100

## 2024-09-17 MED ORDER — HEPARIN SODIUM (PORCINE) 5000 UNIT/ML IJ SOLN
5000.0000 [IU] | INTRAMUSCULAR | Status: AC
  Administered 2024-09-17: 5000 [IU] via SUBCUTANEOUS
  Filled 2024-09-17: qty 1

## 2024-09-17 MED ORDER — FENTANYL CITRATE (PF) 100 MCG/2ML IJ SOLN
INTRAMUSCULAR | Status: AC
  Filled 2024-09-17: qty 2

## 2024-09-17 MED ORDER — OXYCODONE HCL 5 MG PO TABS: 5.0000 mg | ORAL_TABLET | Freq: Once | ORAL | Status: DC | PRN

## 2024-09-17 MED ORDER — SODIUM CHLORIDE (PF) 0.9 % IJ SOLN
INTRAMUSCULAR | Status: DC | PRN
  Administered 2024-09-17: 20 mL

## 2024-09-17 MED ORDER — INSULIN ASPART 100 UNIT/ML IJ SOLN: 0.0000 [IU] | Freq: Three times a day (TID) | INTRAMUSCULAR | Status: DC

## 2024-09-17 MED ORDER — BUPIVACAINE LIPOSOME 1.3 % IJ SUSP
INTRAMUSCULAR | Status: AC
  Filled 2024-09-17: qty 20

## 2024-09-17 MED ORDER — PHENYLEPHRINE HCL-NACL 20-0.9 MG/250ML-% IV SOLN
INTRAVENOUS | Status: DC | PRN
  Administered 2024-09-17: 160 ug via INTRAVENOUS
  Administered 2024-09-17: 50 ug/min via INTRAVENOUS

## 2024-09-17 MED ORDER — BUPIVACAINE HCL (PF) 0.25 % IJ SOLN
INTRAMUSCULAR | Status: AC
  Filled 2024-09-17: qty 30

## 2024-09-17 MED ORDER — DEXAMETHASONE SOD PHOSPHATE PF 10 MG/ML IJ SOLN
INTRAMUSCULAR | Status: DC | PRN
  Administered 2024-09-17: 8 mg via INTRAVENOUS

## 2024-09-17 MED ORDER — DEXAMETHASONE SOD PHOSPHATE PF 10 MG/ML IJ SOLN: 4.0000 mg | INTRAMUSCULAR | Status: DC

## 2024-09-17 MED ORDER — SODIUM CHLORIDE (PF) 0.9 % IJ SOLN
INTRAMUSCULAR | Status: AC
  Filled 2024-09-17: qty 20

## 2024-09-17 MED ORDER — HYDROMORPHONE HCL 1 MG/ML IJ SOLN
0.5000 mg | INTRAMUSCULAR | Status: DC | PRN
  Filled 2024-09-17: qty 0.5

## 2024-09-17 MED ORDER — ENOXAPARIN SODIUM 40 MG/0.4ML IJ SOSY
40.0000 mg | PREFILLED_SYRINGE | INTRAMUSCULAR | Status: DC
  Administered 2024-09-18: 40 mg via SUBCUTANEOUS
  Filled 2024-09-17: qty 0.4

## 2024-09-17 MED ORDER — CEFAZOLIN SODIUM 1 G IJ SOLR
INTRAMUSCULAR | Status: AC
  Filled 2024-09-17: qty 20

## 2024-09-17 MED ORDER — MIDAZOLAM HCL (PF) 2 MG/2ML IJ SOLN: 0.5000 mg | Freq: Once | INTRAMUSCULAR | Status: DC | PRN

## 2024-09-17 MED ORDER — IBUPROFEN 400 MG PO TABS
600.0000 mg | ORAL_TABLET | Freq: Three times a day (TID) | ORAL | Status: DC
  Administered 2024-09-18: 600 mg via ORAL
  Filled 2024-09-17: qty 1

## 2024-09-17 MED ORDER — ROCURONIUM BROMIDE 100 MG/10ML IV SOLN
INTRAVENOUS | Status: DC | PRN
  Administered 2024-09-17: 20 mg via INTRAVENOUS
  Administered 2024-09-17: 30 mg via INTRAVENOUS
  Administered 2024-09-17: 20 mg via INTRAVENOUS
  Administered 2024-09-17: 10 mg via INTRAVENOUS
  Administered 2024-09-17: 60 mg via INTRAVENOUS

## 2024-09-17 MED ORDER — CHLORHEXIDINE GLUCONATE 0.12 % MT SOLN
15.0000 mL | Freq: Once | OROMUCOSAL | Status: AC
  Administered 2024-09-17: 15 mL via OROMUCOSAL

## 2024-09-17 MED ORDER — LIDOCAINE HCL (CARDIAC) PF 100 MG/5ML IV SOSY
PREFILLED_SYRINGE | INTRAVENOUS | Status: DC | PRN
  Administered 2024-09-17: 40 mg via INTRAVENOUS

## 2024-09-17 MED ORDER — FENTANYL CITRATE (PF) 100 MCG/2ML IJ SOLN
INTRAMUSCULAR | Status: DC | PRN
  Administered 2024-09-17: 50 ug via INTRAVENOUS
  Administered 2024-09-17: 100 ug via INTRAVENOUS
  Administered 2024-09-17: 50 ug via INTRAVENOUS

## 2024-09-17 MED ORDER — ONDANSETRON HCL 4 MG PO TABS: 4.0000 mg | ORAL_TABLET | Freq: Four times a day (QID) | ORAL | Status: DC | PRN

## 2024-09-17 NOTE — Anesthesia Procedure Notes (Signed)
 Procedure Name: Intubation Date/Time: 09/17/2024 10:55 AM  Performed by: Deeann Eva BROCKS, CRNAPre-anesthesia Checklist: Patient identified, Emergency Drugs available, Suction available and Patient being monitored Patient Re-evaluated:Patient Re-evaluated prior to induction Oxygen Delivery Method: Circle System Utilized Preoxygenation: Pre-oxygenation with 100% oxygen Induction Type: IV induction Ventilation: Mask ventilation without difficulty Laryngoscope Size: Mac and 3 Grade View: Grade I Tube type: Oral Tube size: 7.0 mm Number of attempts: 1 Airway Equipment and Method: Stylet Placement Confirmation: ETT inserted through vocal cords under direct vision, positive ETCO2 and breath sounds checked- equal and bilateral Secured at: 21 cm Tube secured with: Tape Dental Injury: Teeth and Oropharynx as per pre-operative assessment

## 2024-09-17 NOTE — Plan of Care (Signed)
" °  Problem: Neurological: Goal: Will regain or maintain usual level of consciousness Outcome: Progressing   Problem: Respiratory: Goal: Will regain and/or maintain adequate ventilation Outcome: Progressing   Problem: Clinical Measurements: Goal: Ability to maintain clinical measurements within normal limits will improve Outcome: Progressing   "

## 2024-09-17 NOTE — Transfer of Care (Signed)
 Immediate Anesthesia Transfer of Care Note  Patient: [name]  Procedure(s) Performed: ROBOTIC ASSISTED TOTAL LAPAROSCOPIC HYSTERECTOMY WITH BILATERAL SALPINGO-OOPHORECTOMY (Bilateral) SENTINEL LYMPH NODE EVALUATION (Bilateral) BILATERAL PELVIC LYMPHADENECTOMY (Bilateral) CYSTOSCOPY  Patient Location: PACU  Anesthesia Type:General  Level of Consciousness: awake and alert   Airway & Oxygen Therapy: Patient Spontanous Breathing and Patient connected to face mask oxygen  Post-op Assessment: Report given to RN and Post -op Vital signs reviewed and stable  Post vital signs: Reviewed and stable  Last Vitals:  Vitals Value Taken Time  BP 120/82 09/17/24 16:32  Temp 37.4 C 09/17/24 16:32  Pulse 87 09/17/24 16:34  Resp 15 09/17/24 16:34  SpO2 97 % 09/17/24 16:34  Vitals shown include unfiled device data.  Last Pain:  Vitals:   09/17/24 1632  TempSrc: Oral  PainSc:          Complications: No notable events documented.

## 2024-09-17 NOTE — Brief Op Note (Signed)
 09/17/2024 10:29 AM  4:24 PM  PATIENT:  [name]  84 y.o. female  PRE-OPERATIVE DIAGNOSIS: Thickened endometrium, post menopausal bleeding, suspicion for malignancy  POST-OPERATIVE DIAGNOSIS:  Thickened endometrium, post menopausal bleeding, suspicion for malignancy  PROCEDURE:  Procedures with comments: ROBOTIC ASSISTED TOTAL LAPAROSCOPIC HYSTERECTOMY WITH BILATERAL SALPINGO-OOPHORECTOMY (Bilateral) - possible laparotomy SENTINEL LYMPH NODE EVALUATION (Bilateral) BILATERAL PELVIC LYMPHADENECTOMY (Bilateral) CYSTOSCOPY (N/A)  SURGEON:  Surgeons and Role:    DEWAINE Eldonna Mays, MD - Primary    * Rogelio Planas, MD - Assisting  ANESTHESIA:   general  EBL:  100 mL   BLOOD ADMINISTERED:none  DRAINS: none   LOCAL MEDICATIONS USED:  BUPIVICAINE  and OTHER Exparel   SPECIMEN:   ID Type Source Tests Collected by Time Destination  1 : Uterus, cervix, bilateral tubes and ovaries   *Please look at endometrium Tissue PATH Gyn tumor resection SURGICAL PATHOLOGY Eldonna Mays, MD 09/17/2024 1226   2 : Right pelvic lymph node Tissue PATH Lymph node biopsy SURGICAL PATHOLOGY Eldonna Mays, MD 09/17/2024 1424   3 : Left pelvic lymph node Tissue PATH Lymph node biopsy SURGICAL PATHOLOGY Eldonna Mays, MD 09/17/2024 1449     DISPOSITION OF SPECIMEN:  PATHOLOGY  COUNTS:  YES  TOURNIQUET:  * No tourniquets in log *  DICTATION: .Note written in EPIC  PLAN OF CARE: Admit for overnight observation  PATIENT DISPOSITION:  PACU - hemodynamically stable.   Delay start of Pharmacological VTE agent (>24hrs) due to surgical blood loss or risk of bleeding: no

## 2024-09-17 NOTE — Discharge Instructions (Signed)
 Plan to have lab work ordered by the Nephrology Team on Monday, September 15.  Dr. Eldonna wants you to take a reduced dose of Eliquis . Do not take your regular dose of 5 mg twice a day until you hear from our office. She would like for you to start taking 2.5 mg twice a day starting tonight for at least 7 days after surgery. If your creatinine goes below 3 when you have repeat labs, you will be able to increase the dose back to 5 mg twice daily at that time.   Plan on monitoring your blood pressure at home and we will get the values from you tomorrow when we call.   AFTER SURGERY INSTRUCTIONS   Return to work: 4-6 weeks if applicable   Activity: 1. Be up and out of the bed during the day.  Take a nap if needed.  You may walk up steps but be careful and use the hand rail.  Stair climbing will tire you more than you think, you may need to stop part way and rest.    2. No lifting or straining for 6 weeks over 10 pounds. No pushing, pulling, straining for 6 weeks.   3. No driving for 4-89 days when the following criteria have been met: Do not drive if you are taking narcotic pain medicine and make sure that your reaction time has returned.    4. You can shower as soon as the next day after surgery. Shower daily.  Use your regular soap and water  (not directly on the incision) and pat your incision(s) dry afterwards; don't rub.  No tub baths or submerging your body in water  until cleared by your surgeon. If you have the soap that was given to you by pre-surgical testing that was used before surgery, you do not need to use it afterwards because this can irritate your incisions.    5. No sexual activity and nothing in the vagina for 12 weeks.   6. You may experience a small amount of clear drainage from your incisions, which is normal.  If the drainage persists, increases, or changes color please call the office.   7. Do not use creams, lotions, or ointments such as neosporin on your incisions after  surgery until advised by your surgeon because they can cause removal of the dermabond glue on your incisions.     8. You may experience vaginal spotting after surgery or when the stitches at the top of the vagina begin to dissolve.  The spotting is normal but if you experience heavy bleeding, call our office.   9. Take Tylenol  first for pain if you are able to take these medication and only use narcotic pain medication for severe pain not relieved by the Tylenol .  Monitor your Tylenol  intake to a max of 4,000 mg in a 24 hour period.   Diet: 1. Low sodium Heart Healthy Diet is recommended but you are cleared to resume your normal (before surgery) diet after your procedure.   2. It is safe to use a laxative, such as Miralax or Colace, if you have difficulty moving your bowels before surgery. You have been prescribed Sennakot-S to take at bedtime every evening after surgery to keep bowel movements regular and to prevent constipation.     Wound Care: 1. Keep clean and dry.  Shower daily.   Reasons to call the Doctor: Fever - Oral temperature greater than 100.4 degrees Fahrenheit Foul-smelling vaginal discharge Difficulty urinating Nausea and vomiting Increased pain  at the site of the incision that is unrelieved with pain medicine. Difficulty breathing with or without chest pain New calf pain especially if only on one side Sudden, continuing increased vaginal bleeding with or without clots.   Contacts: For questions or concerns you should contact:   [name] at [phone]   Maria Epps, NP at [phone]   After Hours: call [phone] and have the GYN Oncologist paged/contacted (after 5 pm or on the weekends). You will speak with an after hours RN and let he or she know you have had surgery.   Messages sent via mychart are for non-urgent matters and are not responded to after hours so for urgent needs, please call the after hours number.

## 2024-09-17 NOTE — Interval H&P Note (Signed)
 History and Physical Interval Note:  09/17/2024 10:22 AM  [name]  has presented today for surgery, with the diagnosis of thickened endometrium, post menopausal bleeding, suspicion for malignancy.  The various methods of treatment have been discussed with the patient and family. After consideration of risks, benefits and other options for treatment, the patient has consented to  Procedures with comments: HYSTERECTOMY, TOTAL, ROBOT-ASSISTED, LAPAROSCOPIC, WITH BILATERAL SALPINGO-OOPHORECTOMY (N/A) - possible laparotomy INJECTION, FOR SENTINEL LYMPH NODE IDENTIFICATION (N/A) LYMPH NODE BIOPSY (N/A) LYMPHADENECTOMY, PELVIS, ROBOT-ASSISTED (N/A) as a surgical intervention.  The patient's history has been reviewed, patient examined, no change in status, stable for surgery.  I have reviewed the patient's chart and labs.  Questions were answered to the patient's satisfaction.     [name]

## 2024-09-17 NOTE — Anesthesia Postprocedure Evaluation (Signed)
"   Anesthesia Post Note  Patient: [name]  Procedure(s) Performed: ROBOTIC ASSISTED TOTAL LAPAROSCOPIC HYSTERECTOMY WITH BILATERAL SALPINGO-OOPHORECTOMY (Bilateral) SENTINEL LYMPH NODE EVALUATION (Bilateral) BILATERAL PELVIC LYMPHADENECTOMY (Bilateral) CYSTOSCOPY     Patient location during evaluation: PACU Anesthesia Type: General Level of consciousness: awake and alert and oriented Pain management: pain level controlled Vital Signs Assessment: post-procedure vital signs reviewed and stable Respiratory status: spontaneous breathing, nonlabored ventilation and respiratory function stable Cardiovascular status: blood pressure returned to baseline and stable Postop Assessment: no apparent nausea or vomiting Anesthetic complications: no   No notable events documented.  Last Vitals:  Vitals:   09/17/24 1708 09/17/24 1715  BP:  119/67  Pulse: 76 67  Resp: 15 (!) 8  Temp:    SpO2: 93% 93%    Last Pain:  Vitals:   09/17/24 1715  TempSrc:   PainSc: Asleep                 Ahmani Daoud A.      "

## 2024-09-17 NOTE — Op Note (Signed)
 GYNECOLOGIC ONCOLOGY OPERATIVE NOTE  Date of Service: 09/17/2024  Preoperative Diagnosis: Thickened endometrium, post menopausal bleeding, suspicion for malignancy, Uterine fibroids  Postoperative Diagnosis: Uterine fibroids, endometrial adenocarcinoma  Procedures: Robotic-assisted total laparoscopic hysterectomy (>250g), bilateral salpingo-oophorectomy, bilateral sentinel lymph node evaluation, bilateral pelvic lymphadenectomy, cystoscopy  Surgeon: [name], MD  Assistants: Olam Mill, MD and (an MD assistant was necessary for tissue manipulation, management of robotic instrumentation, retraction and positioning due to the complexity of the case and hospital policies)  Anesthesia: General  Estimated Blood Loss: 100 mL    Fluids: 1800 ml, crystalloid  Urine Output: , clear yellow  Findings:  Normal upper abdominal survey with normal liver surface and diaphragm. Normal appearing small and large bowel. Enlarged multifibroid uterus. Evidence of perforation of manipulator through the posterior cervix. No evidence of peritoneal disease, ascites, or carcinomatosis. No sentinel mapping bilaterally. IOFS c/w endometrial adenocarcinoma. Mayo criteria: low grade, <50% myometrial invasion, 5cm tumor. Given this, decision made to proceed with bilateral pelvic lymphadenectomy. Cystoscopy with normal bladder without injury and normal efflux from bilateral ureters.   Specimens:  ID Type Source Tests Collected by Time Destination  1 : Uterus, cervix, bilateral tubes and ovaries   *Please look at endometrium Tissue PATH Gyn tumor resection SURGICAL PATHOLOGY Masters Hoy, MD 09/17/2024 1226   2 : Right pelvic lymph node Tissue PATH Lymph node biopsy SURGICAL PATHOLOGY Masters Hoy, MD 09/17/2024 1424   3 : Left pelvic lymph node Tissue PATH Lymph node biopsy SURGICAL PATHOLOGY Masters Hoy, MD 09/17/2024 1449     Complications:  None  Indications for Procedure: LEXXI KOSLOW is a 84 y.o. woman with postmenopausal bleeding, imaging with dilated endometrial cavity with thickened endometrium with necrosis noted on D&C specimen, suspicious but not diagnostic for malignancy.  Prior to the procedure, all risks, benefits, and alternatives were discussed and informed surgical consent was signed.  Procedure: Patient was taken to the operating room where general anesthesia was achieved.  She was positioned in dorsal lithotomy and prepped and draped.  A foley catheter was inserted into the bladder. 1 ml of dilute Indo-Cyanine dye was was injected at 1cm and 1mm deep at 3 and 9 o'clock in the cervical stroma.  The cervix was dilated and an Advincula uterine manipulator with a colpotomy ring was inserted into the uterus.  A 12 mm incision was made in the left upper quadrant near Palmer's point.  The abdomen was entered with a 5 mm OptiView trocar under direct visualization.  The abdomen was insufflated, the patient placed in steep Trendelenburg, and additional trocars were placed as follows: an 8mm trocar superior to the umbilicus, two 8 mm robotic trocars in the right abdomen, and one 8 mm robotic trocar in the left abdomen.  The left upper quadrant trocar was removed and replaced with a 12 mm airseal trocar.  All trocars were placed under direct visualization.  The bowels were moved into the upper abdomen.  The DaVinci robotic surgical system was brought to the patient's bedside and docked.  The right round ligament was transected and the retroperitoneum entered.The right ureter was identified. The paravesical and pararectal spaces were opened but no sentinel lymph node channels were identified.  A similar procedure was performed on the left and again no sentinel lymph node channels were identified.  The right ureter was again identified, and the right infundibulopelvic ligament was isolated, cauterized, and transected. The posterior peritoneum was opened to the colpotomy ring.  The anterior peritoneum was opened and  the bladder flap was created. The right uterine artery was skeletonized, cauterized, and transected at the level of the colpotomy ring. Additional cautery was used in a C-shaped fashion to allow the remainder of the broad, cardinal, and uterosacral ligaments with the uterine vessels to be transected and fall away from the colpotomy ring.  A similar procedure was performed on the contralateral side.  The uterine manipulator was noted to have perforated through the posterior proximal cervix.  Patient was also noted to have abundant and redundant vasculature due to her multifibroid uterus.  A colpotomy was made circumferentially following the contours of the colpotomy ring.  The uterine specimen was placed in an Endo Catch bag and stored in the upper abdomen for later retrieval.  The vaginal cuff was closed with a running stitch of 0 Vicryl suture.  The pelvis was irrigated.  Surgiflo was placed along the cuff.  The pelvis was again inspected and all operative sites were found to be hemostatic.  All instruments were removed and the robot was taken from the patient's bedside.   With insufflation still in place, the supraumbilical robotic trocar was removed.  The midline incision was extended cephalad with a scalpel to approximately 6 cm.  The subcutaneous tissue was divided with electrocautery down to the fascia.  The fascial and peritoneal incisions were extended with electrocautery.  The abdomen was desufflated.  An Alexis retractor was placed.  The Endo Catch bag was brought up through the [facility]  1 subserosal fibroid was detached.  The remainder of the uterine specimen was then removed intact in the Endo Catch bag and handed off the field for frozen pathology.  The Alexis cap was placed.  While awaiting frozen pathology, the bladder was backfilled with saline and the Foley catheter removed.  A camera was introduced into the bladder and a survey was performed  with the findings noted above.  The camera was removed and the Foley catheter was replaced.  At this time, frozen pathology returned with endometrial adenocarcinoma, not meeting Mayo criteria given tumor greater than 2 cm.  Given this, decision made to proceed with bilateral pelvic lymphadenectomy.  The abdomen was reinsufflated.  The robot was docked back at the patient bedside and instruments were reintroduced into the pelvis.  The paravesical and pararectal spaces were opened.  A right pelvic lymphadenectomy was performed using the following boundaries: the bifurcation of the common iliac vessels cephalad, the distal circumflex vein caudad, the genitofemoral nerve laterally, the superior vesical artery medially, and the obturator nerve at the floor of the dissection.  A left pelvic lymphadenectomy was performed using the same boundaries.  These specimens were placed in separate Endo-Catch bags and stored in the upper abdomen for later retrieval.  The pelvis was then irrigated.  Surgiflo was placed in both lymphadenectomy beds and hemostasis was achieved.  All instruments were removed and the robot was taken from the patient bedside.  The fascia at the 12 mm incision was closed with 0 Vicryl using a PMI device. The cap was removed from the [place] and the abdomen was desufflated.  The Endo Catch bags with the pelvic lymph node specimens were removed.  All ports were removed.   The fascia at the midline incision was closed with a running stitch #1 PDS. Exparel  was injected at the incision site in standard fashion. The subcutaneous tissues were irrigated and hemostasis achieved. The subcutaneous space was approximated with a running stitch of 2-0 Vicryl. The skin was closed with 4-0  vicryl deep dermal, 4-0 monocryl in a subcuticular fashion and surgical glue.   The skin at all laparoscopic incisions was closed with 4-0 Vicryl to reapproximate the subcutaneous tissue and 4-0 monocryl in a subcuticular  fashion followed by surgical glue.  Patient tolerated the procedure well. Sponge, lap, and instrument counts were correct.  Patient received 2 gm of Ancef  and 500mg  of metronidazole  prior to skin incision for routine perioperative antibiotic prophylaxis.  She was extubated and taken to the PACU in stable condition.  [name], MD Gynecologic Oncology

## 2024-09-18 ENCOUNTER — Encounter (HOSPITAL_COMMUNITY): Payer: Self-pay | Admitting: Psychiatry

## 2024-09-18 DIAGNOSIS — C541 Malignant neoplasm of endometrium: Secondary | ICD-10-CM | POA: Diagnosis not present

## 2024-09-18 LAB — CBC
HCT: 37.1 % (ref 36.0–46.0)
Hemoglobin: 12.4 g/dL (ref 12.0–15.0)
MCH: 33.7 pg (ref 26.0–34.0)
MCHC: 33.4 g/dL (ref 30.0–36.0)
MCV: 100.8 fL — ABNORMAL HIGH (ref 80.0–100.0)
Platelets: 234 K/uL (ref 150–400)
RBC: 3.68 MIL/uL — ABNORMAL LOW (ref 3.87–5.11)
RDW: 12.2 % (ref 11.5–15.5)
WBC: 11.5 K/uL — ABNORMAL HIGH (ref 4.0–10.5)
nRBC: 0 % (ref 0.0–0.2)

## 2024-09-18 LAB — GLUCOSE, CAPILLARY
Glucose-Capillary: 112 mg/dL — ABNORMAL HIGH (ref 70–99)
Glucose-Capillary: 119 mg/dL — ABNORMAL HIGH (ref 70–99)

## 2024-09-18 LAB — BASIC METABOLIC PANEL WITH GFR
Anion gap: 11 (ref 5–15)
BUN: 16 mg/dL (ref 8–23)
CO2: 24 mmol/L (ref 22–32)
Calcium: 10.1 mg/dL (ref 8.9–10.3)
Chloride: 102 mmol/L (ref 98–111)
Creatinine, Ser: 1.3 mg/dL — ABNORMAL HIGH (ref 0.44–1.00)
GFR, Estimated: 41 mL/min — ABNORMAL LOW
Glucose, Bld: 104 mg/dL — ABNORMAL HIGH (ref 70–99)
Potassium: 4.5 mmol/L (ref 3.5–5.1)
Sodium: 137 mmol/L (ref 135–145)

## 2024-09-18 NOTE — Progress Notes (Signed)
" °   09/18/24 1236  TOC Brief Assessment  Insurance and Status Reviewed  Patient has primary care physician Yes  Home environment has been reviewed single family home  Prior level of function: independent  Prior/Current Home Services No current home services  Social Drivers of Health Review SDOH reviewed no interventions necessary  Readmission risk has been reviewed Yes  Transition of care needs no transition of care needs at this time    Signed: Heather Saltness, MSW, LCSW Clinical Social Worker Inpatient Care Management 09/18/2024 12:37 PM   "

## 2024-09-18 NOTE — Progress Notes (Signed)
 1 Day Post-Op Procedures (LRB): ROBOTIC ASSISTED TOTAL LAPAROSCOPIC HYSTERECTOMY WITH BILATERAL SALPINGO-OOPHORECTOMY (Bilateral) SENTINEL LYMPH NODE EVALUATION (Bilateral) BILATERAL PELVIC LYMPHADENECTOMY (Bilateral) CYSTOSCOPY (N/A)  Subjective: Patient reports doing well this am. Slept well last pm. Abdominal soreness is manageable. No nausea or emesis. Tolerating solid food this am. No flatus or BM. Sat in chair last pm and tolerated this well. Has voided after foley removal without difficulty. Denies chest pain, dyspnea.   Objective: Vital signs in last 24 hours: Temp:  [97.4 F (36.3 C)-99.3 F (37.4 C)] 98.6 F (37 C) (01/21 0622) Pulse Rate:  [67-89] 78 (01/21 0622) Resp:  [8-20] 18 (01/21 0622) BP: (113-144)/(63-88) 124/67 (01/21 0622) SpO2:  [93 %-99 %] 96 % (01/21 0622) Weight:  [201 lb (91.2 kg)] 201 lb (91.2 kg) (01/20 0907) Last BM Date : 09/15/24  Intake/Output from previous day: 01/20 0701 - 01/21 0700 In: 3028.3 [P.O.:400; I.V.:2328.3; IV Piggyback:300] Out: 1350 [Urine:1250; Blood:100]  Physical Examination: General: alert, cooperative, and no distress Resp: clear to auscultation bilaterally Cardio: regular rate and rhythm, S1, S2 normal, no murmur, click, rub or gallop GI: soft, non-tender; bowel sounds normal; no masses,  no organomegaly and incision: abdominal lap site incisions with dermabond intact with no active drainage Extremities: extremities normal, atraumatic, no cyanosis or edema  Labs: WBC/Hgb/Hct/Plts:  11.5/12.4/37.1/234 (01/21 0457) BUN/Cr/glu/ALT/AST/amyl/lip:  16/1.30/--/--/--/--/-- (01/21 0457)  Assessment: 84 y.o. s/p Procedures: ROBOTIC ASSISTED TOTAL LAPAROSCOPIC HYSTERECTOMY WITH BILATERAL SALPINGO-OOPHORECTOMY SENTINEL LYMPH NODE EVALUATION BILATERAL PELVIC LYMPHADENECTOMY CYSTOSCOPY: stable Pain:  Pain is well-controlled on PRN medications.  Heme: Hgb 12.4 and Hct 37.1 this am. Appropriate given preop values and surgical  losses.  ID: WBC 11.5. Given intra-op abxs and decadron . No evidence of infection at this time.   CV: BP and HR stable. Continue to monitor while inpatient.  GI:  Tolerating po: yes. Antiemetics ordered if needed.  GU: Voiding since foley removal. Creatinine at 1.30 this am-at baseline. Adequate output reported.     FEN: No critical values on am labs.  Prophylaxis: SCDs and lovenox  ordered.  Plan: Doing well. Encourage ambulation in the hall. Plan for probable discharge later today. Continue with current plan of care.   LOS: 0 days    [name] 09/18/2024, 8:18 AM

## 2024-09-18 NOTE — Discharge Summary (Signed)
 " Physician Discharge Summary  Patient ID: [name] MRN: [id number] DOB/AGE: [birth year] 84 y.o.  Admit date: 09/17/2024 Discharge date: 09/18/2024  Admission Diagnoses: Endometrial mass  Discharge Diagnoses:  Principal Problem:   Endometrial mass Active Problems:   Endometrial cancer [facility])   Discharged Condition:  The patient is in good condition and stable for discharge.   Hospital Course: On 09/17/2024, the patient underwent the following: Procedures: ROBOTIC ASSISTED TOTAL LAPAROSCOPIC HYSTERECTOMY WITH BILATERAL SALPINGO-OOPHORECTOMY, SENTINEL LYMPH NODE EVALUATION, BILATERAL PELVIC LYMPHADENECTOMY, CYSTOSCOPY.   The postoperative course was uneventful.  She was discharged to home on postoperative day 1 tolerating a regular diet, ambulating, pain controlled, voiding.   Consults: None  Significant Diagnostic Studies: Labs  Treatments: Surgery: see above  Discharge Exam (from am assessment): Blood pressure 132/73, pulse 90, temperature 97.8 F (36.6 C), temperature source Oral, resp. rate 16, height 5' 2 (1.575 m), weight 201 lb (91.2 kg), SpO2 95%. General: alert, cooperative, and no distress Resp: clear to auscultation bilaterally Cardio: regular rate and rhythm, S1, S2 normal, no murmur, click, rub or gallop GI: soft, non-tender; bowel sounds normal; no masses,  no organomegaly and incision: abdominal lap site incisions with dermabond intact with no active drainage Extremities: extremities normal, atraumatic, no cyanosis or edema  Disposition: Discharge disposition: 01-Home or Self Care       Discharge Instructions     Call MD for:  difficulty breathing, headache or visual disturbances   Complete by: As directed    Call MD for:  extreme fatigue   Complete by: As directed    Call MD for:  hives   Complete by: As directed    Call MD for:  persistant dizziness or light-headedness   Complete by: As directed    Call MD for:  persistant nausea and  vomiting   Complete by: As directed    Call MD for:  redness, tenderness, or signs of infection (pain, swelling, redness, odor or green/yellow discharge around incision site)   Complete by: As directed    Call MD for:  severe uncontrolled pain   Complete by: As directed    Call MD for:  temperature >100.4   Complete by: As directed    Diet - low sodium heart healthy   Complete by: As directed    Driving Restrictions   Complete by: As directed    No driving for 4-89 days until the following has been met: Do not take narcotics and drive. You need to make sure your reaction time has returned.   Increase activity slowly   Complete by: As directed    Lifting restrictions   Complete by: As directed    No lifting greater than 10 lbs, pushing, pulling, straining for 6 weeks.   Sexual Activity Restrictions   Complete by: As directed    No sexual activity, nothing in the vagina, for 12 weeks.      Allergies as of 09/18/2024       Reactions   Codeine Other (See Comments)   Pt states I feel like I am going to pass out.        Medication List     TAKE these medications    acetaminophen  500 MG tablet Commonly known as: TYLENOL  Take 500 mg by mouth every 6 (six) hours as needed for moderate pain (pain score 4-6).   allopurinol  100 MG tablet Commonly known as: ZYLOPRIM  Take 2 tablets (200 mg total) by mouth daily. What changed: how much to take  amLODipine  10 MG tablet Commonly known as: NORVASC  Take 1 tablet (10 mg total) by mouth daily. What changed: when to take this   aspirin EC 81 MG tablet Take 81 mg by mouth daily.   atorvastatin  10 MG tablet Commonly known as: LIPITOR Take 1 tablet (10 mg total) by mouth daily.   Breztri  Aerosphere 160-9-4.8 MCG/ACT Aero inhaler Generic drug: budesonide -glycopyrrolate -formoterol  Inhale 2 puffs into the lungs 2 (two) times daily.   diclofenac  sodium 1 % Gel Commonly known as: Voltaren  Apply 2 g topically 4 (four) times daily.  Pain   fluticasone  50 MCG/ACT nasal spray Commonly known as: FLONASE  Place 2 sprays into both nostrils daily. What changed:  when to take this reasons to take this   Jardiance 10 MG Tabs tablet Generic drug: empagliflozin Take 10 mg by mouth daily.   loratadine  10 MG tablet Commonly known as: CLARITIN  Take 1 tablet (10 mg total) by mouth daily as needed for allergies.   pantoprazole  40 MG tablet Commonly known as: PROTONIX  Take 1 tablet (40 mg total) by mouth daily. 30 minutes before breakfast   polyethylene glycol powder 17 GM/SCOOP powder Commonly known as: GLYCOLAX /MIRALAX  Take 17 g by mouth daily as needed.   senna-docusate 8.6-50 MG tablet Commonly known as: Senokot-S Take 2 tablets by mouth at bedtime. For AFTER surgery, do not take if having diarrhea   traMADol  50 MG tablet Commonly known as: ULTRAM  Take 1 tablet (50 mg total) by mouth every 6 (six) hours as needed for moderate pain (pain score 4-6). For AFTER surgery only, do not take and drive   triamterene -hydrochlorothiazide 37.5-25 MG tablet Commonly known as: MAXZIDE-25 Take 1 tablet by mouth once daily What changed: how much to take   VITAMIN C PO Take 1 tablet by mouth daily.   VITAMIN K2-VITAMIN D3 PO Take 1 tablet by mouth daily.        Follow-up Information     Eldonna Mays, MD Follow up on 09/30/2024.   Specialty: Gynecologic Oncology Why: at the [facility] information: [address] [place] [address] [phone]                 Greater than thirty minutes were spend for face to face discharge instructions and discharge orders/summary in EPIC.   Signed: Eleanor JONETTA Epps 09/18/2024, 11:16 AM     "

## 2024-09-19 ENCOUNTER — Telehealth: Payer: Self-pay | Admitting: *Deleted

## 2024-09-19 NOTE — Telephone Encounter (Signed)
 Spoke with [name] this afternoon. She states she is eating, drinking and urinating well. She has not had a BM yet but is passing gas. She is taking senokot as prescribed and encouraged her to drink plenty of water . She denies fever or chills. Incisions are dry and intact. She rates her pain 0/10. Pt states she took one tramadol  this morning and hasn't needing anything else.   Instructed to call office with any fever, chills, purulent drainage, uncontrolled pain or any other questions or concerns. Patient verbalizes understanding.   Pt aware of post op appointments as well as the office number [phone] and after hours number [phone] to call if she has any questions or concerns

## 2024-09-23 ENCOUNTER — Ambulatory Visit: Payer: Self-pay | Admitting: Psychiatry

## 2024-09-24 ENCOUNTER — Telehealth: Payer: Self-pay | Admitting: *Deleted

## 2024-09-24 NOTE — Telephone Encounter (Signed)
 Per provider spoke with the patient and moved appt from 2/2 to 2/3. Patient aware

## 2024-09-30 ENCOUNTER — Inpatient Hospital Stay: Admitting: Psychiatry

## 2024-10-01 ENCOUNTER — Inpatient Hospital Stay: Attending: Psychiatry | Admitting: Psychiatry

## 2024-10-01 ENCOUNTER — Encounter: Payer: Self-pay | Admitting: Oncology

## 2024-10-01 ENCOUNTER — Encounter: Payer: Self-pay | Admitting: Psychiatry

## 2024-10-01 VITALS — BP 138/66 | HR 90 | Temp 98.7°F | Resp 19 | Wt 198.8 lb

## 2024-10-01 DIAGNOSIS — R9389 Abnormal findings on diagnostic imaging of other specified body structures: Secondary | ICD-10-CM

## 2024-10-01 DIAGNOSIS — Z90710 Acquired absence of both cervix and uterus: Secondary | ICD-10-CM | POA: Insufficient documentation

## 2024-10-01 DIAGNOSIS — C541 Malignant neoplasm of endometrium: Secondary | ICD-10-CM

## 2024-10-01 DIAGNOSIS — R918 Other nonspecific abnormal finding of lung field: Secondary | ICD-10-CM

## 2024-10-01 DIAGNOSIS — Z90722 Acquired absence of ovaries, bilateral: Secondary | ICD-10-CM | POA: Insufficient documentation

## 2024-10-01 DIAGNOSIS — Z9079 Acquired absence of other genital organ(s): Secondary | ICD-10-CM | POA: Insufficient documentation

## 2024-10-01 DIAGNOSIS — Z7189 Other specified counseling: Secondary | ICD-10-CM

## 2024-10-01 LAB — SURGICAL PATHOLOGY

## 2024-10-01 NOTE — Patient Instructions (Signed)
 It was a pleasure to see you in clinic today. - We will refer you to radiation oncology for consideration of low-dose radiation to the top of the vagina called vaginal brachytherapy. - I will also plan to repeat your CT scan of your lungs in about 3 months to follow-up some small nodules that are likely not related to your endometrial cancer. - We also discussed that some proteins in your tumor were abnormal that sometimes are abnormal randomly or in other cases are related to genetic conditions.  I have 1 more test I am obtaining to help figure this out.  If this is abnormal then I will send you to the genetic counselors to consider blood work. - Return visit planned for after you have finished radiation treatment.  Thank you very much for allowing me to provide care for you today.  I appreciate your confidence in choosing our Gynecologic Oncology team at [facility].  If you have any questions about your visit today please call our office or send us  a MyChart message and we will get back to you as soon as possible.

## 2024-10-01 NOTE — Progress Notes (Signed)
 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  Also placed referral to Radiation Oncology.

## 2024-10-17 ENCOUNTER — Ambulatory Visit

## 2024-10-17 ENCOUNTER — Ambulatory Visit: Admitting: Radiation Oncology

## 2024-12-30 ENCOUNTER — Other Ambulatory Visit (HOSPITAL_COMMUNITY)
